# Patient Record
Sex: Female | Born: 1970
Health system: Southern US, Community
[De-identification: ages and names within clinical notes are randomized; demographics above are authoritative.]

## PROBLEM LIST (undated history)

## (undated) DIAGNOSIS — I499 Cardiac arrhythmia, unspecified: Secondary | ICD-10-CM

## (undated) DIAGNOSIS — R5383 Other fatigue: Secondary | ICD-10-CM

## (undated) DIAGNOSIS — T8859XA Other complications of anesthesia, initial encounter: Secondary | ICD-10-CM

## (undated) DIAGNOSIS — I340 Nonrheumatic mitral (valve) insufficiency: Secondary | ICD-10-CM

## (undated) DIAGNOSIS — T4145XA Adverse effect of unspecified anesthetic, initial encounter: Secondary | ICD-10-CM

## (undated) DIAGNOSIS — R002 Palpitations: Secondary | ICD-10-CM

## (undated) DIAGNOSIS — R079 Chest pain, unspecified: Secondary | ICD-10-CM

## (undated) DIAGNOSIS — Z9889 Other specified postprocedural states: Secondary | ICD-10-CM

## (undated) DIAGNOSIS — R112 Nausea with vomiting, unspecified: Secondary | ICD-10-CM

## (undated) DIAGNOSIS — I341 Nonrheumatic mitral (valve) prolapse: Secondary | ICD-10-CM

## (undated) HISTORY — DX: Other fatigue: R53.83

## (undated) HISTORY — DX: Palpitations: R00.2

## (undated) HISTORY — PX: EYE SURGERY: SHX253

## (undated) HISTORY — PX: TONSILLECTOMY: SUR1361

## (undated) HISTORY — DX: Chest pain, unspecified: R07.9

## (undated) HISTORY — DX: Nonrheumatic mitral (valve) prolapse: I34.1

## (undated) HISTORY — DX: Nonrheumatic mitral (valve) insufficiency: I34.0

## (undated) HISTORY — PX: TONSILLECTOMY: SHX5217

---

## 1998-08-04 ENCOUNTER — Inpatient Hospital Stay (HOSPITAL_COMMUNITY): Admission: AD | Admit: 1998-08-04 | Discharge: 1998-08-06 | Payer: Self-pay | Admitting: Obstetrics and Gynecology

## 2000-10-17 ENCOUNTER — Other Ambulatory Visit: Admission: RE | Admit: 2000-10-17 | Discharge: 2000-10-17 | Payer: Self-pay | Admitting: Obstetrics and Gynecology

## 2001-06-01 ENCOUNTER — Encounter (INDEPENDENT_AMBULATORY_CARE_PROVIDER_SITE_OTHER): Payer: Self-pay | Admitting: Specialist

## 2001-06-01 ENCOUNTER — Other Ambulatory Visit: Admission: RE | Admit: 2001-06-01 | Discharge: 2001-06-01 | Payer: Self-pay | Admitting: Plastic Surgery

## 2001-11-21 ENCOUNTER — Other Ambulatory Visit: Admission: RE | Admit: 2001-11-21 | Discharge: 2001-11-21 | Payer: Self-pay | Admitting: Obstetrics and Gynecology

## 2003-01-01 ENCOUNTER — Other Ambulatory Visit: Admission: RE | Admit: 2003-01-01 | Discharge: 2003-01-01 | Payer: Self-pay | Admitting: Obstetrics and Gynecology

## 2004-01-27 ENCOUNTER — Other Ambulatory Visit: Admission: RE | Admit: 2004-01-27 | Discharge: 2004-01-27 | Payer: Self-pay | Admitting: Obstetrics and Gynecology

## 2005-02-22 ENCOUNTER — Other Ambulatory Visit: Admission: RE | Admit: 2005-02-22 | Discharge: 2005-02-22 | Payer: Self-pay | Admitting: Obstetrics and Gynecology

## 2006-03-24 ENCOUNTER — Other Ambulatory Visit: Admission: RE | Admit: 2006-03-24 | Discharge: 2006-03-24 | Payer: Self-pay | Admitting: Obstetrics and Gynecology

## 2007-09-07 ENCOUNTER — Ambulatory Visit: Payer: Self-pay | Admitting: Internal Medicine

## 2007-09-07 DIAGNOSIS — J029 Acute pharyngitis, unspecified: Secondary | ICD-10-CM | POA: Insufficient documentation

## 2007-09-07 DIAGNOSIS — Z85828 Personal history of other malignant neoplasm of skin: Secondary | ICD-10-CM | POA: Insufficient documentation

## 2007-09-12 ENCOUNTER — Ambulatory Visit: Payer: Self-pay | Admitting: Internal Medicine

## 2007-09-12 DIAGNOSIS — J209 Acute bronchitis, unspecified: Secondary | ICD-10-CM | POA: Insufficient documentation

## 2008-09-12 ENCOUNTER — Ambulatory Visit: Payer: Self-pay | Admitting: Internal Medicine

## 2008-09-12 LAB — CONVERTED CEMR LAB
ALT: 19 units/L (ref 0–35)
Basophils Absolute: 0 10*3/uL (ref 0.0–0.1)
Bilirubin Urine: NEGATIVE
Bilirubin, Direct: 0.2 mg/dL (ref 0.0–0.3)
CO2: 28 meq/L (ref 19–32)
Calcium: 9.3 mg/dL (ref 8.4–10.5)
Cholesterol: 175 mg/dL (ref 0–200)
GFR calc Af Amer: 120 mL/min
HDL: 57.8 mg/dL (ref >39.0)
Hemoglobin: 11.5 g/dL — ABNORMAL LOW (ref 12.0–15.0)
Ketones, urine, test strip: NEGATIVE
LDL Cholesterol: 109 mg/dL — ABNORMAL HIGH (ref 0–99)
Lymphocytes Relative: 22.4 % (ref 12.0–46.0)
MCHC: 34.5 g/dL (ref 30.0–36.0)
Neutro Abs: 3.5 10*3/uL (ref 1.4–7.7)
Nitrite: NEGATIVE
Platelets: 167 10*3/uL (ref 150–400)
Potassium: 3.8 meq/L (ref 3.5–5.1)
Protein, U semiquant: NEGATIVE
RDW: 13.5 % (ref 11.5–14.6)
Sodium: 142 meq/L (ref 135–145)
Total Bilirubin: 1 mg/dL (ref 0.3–1.2)
Triglycerides: 40 mg/dL (ref 0–149)
Urobilinogen, UA: 0.2
VLDL: 8 mg/dL (ref 0–40)

## 2008-09-18 ENCOUNTER — Ambulatory Visit: Payer: Self-pay | Admitting: Internal Medicine

## 2008-09-18 DIAGNOSIS — I341 Nonrheumatic mitral (valve) prolapse: Secondary | ICD-10-CM | POA: Insufficient documentation

## 2008-09-18 DIAGNOSIS — D509 Iron deficiency anemia, unspecified: Secondary | ICD-10-CM | POA: Insufficient documentation

## 2009-02-16 ENCOUNTER — Ambulatory Visit: Payer: Self-pay | Admitting: Internal Medicine

## 2009-02-16 DIAGNOSIS — R55 Syncope and collapse: Secondary | ICD-10-CM | POA: Insufficient documentation

## 2009-02-16 LAB — CONVERTED CEMR LAB
Basophils Relative: 0.1 % (ref 0.0–3.0)
Eosinophils Absolute: 0.1 10*3/uL (ref 0.0–0.7)
Eosinophils Relative: 1.8 % (ref 0.0–5.0)
HCT: 39 % (ref 36.0–46.0)
MCV: 92.4 fL (ref 78.0–100.0)
Monocytes Absolute: 0.3 10*3/uL (ref 0.1–1.0)
Neutro Abs: 3.9 10*3/uL (ref 1.4–7.7)
RBC: 4.22 M/uL (ref 3.87–5.11)
WBC: 6.2 10*3/uL (ref 4.5–10.5)

## 2009-04-03 ENCOUNTER — Ambulatory Visit: Payer: Self-pay | Admitting: Family Medicine

## 2009-04-03 DIAGNOSIS — R609 Edema, unspecified: Secondary | ICD-10-CM | POA: Insufficient documentation

## 2010-08-10 ENCOUNTER — Ambulatory Visit: Payer: Self-pay | Admitting: Cardiovascular Disease

## 2010-08-10 ENCOUNTER — Ambulatory Visit (HOSPITAL_COMMUNITY): Admission: RE | Admit: 2010-08-10 | Discharge: 2010-08-10 | Payer: Self-pay | Admitting: Cardiovascular Disease

## 2012-11-13 ENCOUNTER — Encounter: Payer: Self-pay | Admitting: Cardiovascular Disease

## 2014-03-26 ENCOUNTER — Other Ambulatory Visit: Payer: Self-pay | Admitting: Sports Medicine

## 2014-03-26 DIAGNOSIS — M25569 Pain in unspecified knee: Secondary | ICD-10-CM

## 2014-03-27 ENCOUNTER — Ambulatory Visit
Admission: RE | Admit: 2014-03-27 | Discharge: 2014-03-27 | Disposition: A | Discharge status: Home / Self Care | Payer: 59 | Source: Ambulatory Visit | Attending: Sports Medicine | Admitting: Sports Medicine

## 2014-03-27 DIAGNOSIS — M25569 Pain in unspecified knee: Secondary | ICD-10-CM

## 2014-09-19 ENCOUNTER — Other Ambulatory Visit: Payer: Self-pay | Admitting: Gastroenterology

## 2014-09-19 DIAGNOSIS — R1084 Generalized abdominal pain: Secondary | ICD-10-CM

## 2014-09-25 ENCOUNTER — Ambulatory Visit
Admission: RE | Admit: 2014-09-25 | Discharge: 2014-09-25 | Disposition: A | Discharge status: Home / Self Care | Payer: 59 | Source: Ambulatory Visit | Attending: Gastroenterology | Admitting: Gastroenterology

## 2014-09-25 DIAGNOSIS — R1084 Generalized abdominal pain: Secondary | ICD-10-CM

## 2014-09-25 MED ORDER — IOHEXOL 300 MG/ML  SOLN
100.0000 mL | Freq: Once | INTRAMUSCULAR | Status: AC | PRN
  Administered 2014-09-25: 100 mL via INTRAVENOUS

## 2014-10-02 ENCOUNTER — Other Ambulatory Visit (HOSPITAL_COMMUNITY): Payer: Self-pay | Admitting: Gastroenterology

## 2014-10-02 DIAGNOSIS — R1011 Right upper quadrant pain: Secondary | ICD-10-CM

## 2014-10-15 ENCOUNTER — Ambulatory Visit (HOSPITAL_COMMUNITY)
Admission: RE | Admit: 2014-10-15 | Discharge: 2014-10-15 | Disposition: A | Discharge status: Home / Self Care | Payer: 59 | Source: Ambulatory Visit | Attending: Gastroenterology | Admitting: Gastroenterology

## 2014-10-15 ENCOUNTER — Other Ambulatory Visit (HOSPITAL_COMMUNITY): Payer: Self-pay | Admitting: Gastroenterology

## 2014-10-15 VITALS — Wt 147.0 lb

## 2014-10-15 DIAGNOSIS — R1011 Right upper quadrant pain: Secondary | ICD-10-CM | POA: Insufficient documentation

## 2014-10-15 MED ORDER — TECHNETIUM TC 99M MEBROFENIN IV KIT
5.1000 | PACK | Freq: Once | INTRAVENOUS | Status: AC | PRN
  Administered 2014-10-15: 5 via INTRAVENOUS

## 2014-10-15 MED ORDER — SINCALIDE 5 MCG IJ SOLR
0.0200 ug/kg | Freq: Once | INTRAMUSCULAR | Status: AC
  Administered 2014-10-15: 1.3 ug via INTRAVENOUS

## 2014-12-23 ENCOUNTER — Other Ambulatory Visit (HOSPITAL_COMMUNITY): Payer: Self-pay | Admitting: Gastroenterology

## 2014-12-23 DIAGNOSIS — R110 Nausea: Secondary | ICD-10-CM

## 2015-01-02 ENCOUNTER — Ambulatory Visit (HOSPITAL_COMMUNITY)
Admission: RE | Admit: 2015-01-02 | Discharge: 2015-01-02 | Disposition: A | Discharge status: Home / Self Care | Payer: 59 | Source: Ambulatory Visit | Attending: Gastroenterology | Admitting: Gastroenterology

## 2015-01-02 DIAGNOSIS — R110 Nausea: Secondary | ICD-10-CM | POA: Insufficient documentation

## 2015-01-02 DIAGNOSIS — R109 Unspecified abdominal pain: Secondary | ICD-10-CM | POA: Diagnosis present

## 2015-01-02 MED ORDER — TECHNETIUM TC 99M SULFUR COLLOID: 2.1000 | Freq: Once | INTRAVENOUS | Status: AC | PRN

## 2015-05-01 ENCOUNTER — Ambulatory Visit (INDEPENDENT_AMBULATORY_CARE_PROVIDER_SITE_OTHER): Payer: 59 | Admitting: Cardiovascular Disease

## 2015-05-01 ENCOUNTER — Encounter: Payer: Self-pay | Admitting: Cardiovascular Disease

## 2015-05-01 VITALS — BP 122/78 | HR 60 | Ht 67.25 in | Wt 141.1 lb

## 2015-05-01 DIAGNOSIS — I341 Nonrheumatic mitral (valve) prolapse: Secondary | ICD-10-CM | POA: Diagnosis not present

## 2015-05-01 NOTE — Progress Notes (Signed)
Cardiology Office Note   Date:  05/01/2015   ID:  Kara Owens, DOB 03/20/1971, MRN [id number]  PCP:  Vidal Schwalbe, MD  Cardiologist:   Thayer Headings, MD   Chief Complaint  Patient presents with  . Palpitations   Problem list: 1. Mitral prolapse/mild mitral regurgitation Palpitations   History of Present Illness: Kara Owens is a 44 y.o. female who presents for  Her MVP Has had some stomach issues.   Has chest tightness. Has intrascapular pain . Works in [employer] for [name]    Past Medical History  Diagnosis Date  . Mitral valve prolapse   . Mitral regurgitation     Mild  . Chest pain     Left sided--sharp pain  . Palpitations   . Fatigue     Extreme    Past Surgical History  Procedure Laterality Date  . Tonsillectomy       Current Outpatient Prescriptions  Medication Sig Dispense Refill  . doxycycline (VIBRA-TABS) 100 MG tablet Take 1 tablet by mouth daily.    . polyethylene glycol (MIRALAX / GLYCOLAX) packet Take 17 g by mouth as directed.     No current facility-administered medications for this visit.    Allergies:   Review of patient's allergies indicates no known allergies.    Social History:  The patient  reports that she has never smoked. She does not have any smokeless tobacco history on file. She reports that she drinks alcohol.   Family History:  The patient's family history includes Hypertension in her father and mother; Skin cancer in her father.    ROS:  Please see the history of present illness.    Review of Systems: Constitutional:  denies fever, chills, diaphoresis, appetite change and fatigue.  HEENT: denies photophobia, eye pain, redness, hearing loss, ear pain, congestion, sore throat, rhinorrhea, sneezing, neck pain, neck stiffness and tinnitus.  Respiratory: denies SOB, DOE, cough, chest tightness, and wheezing.  Cardiovascular: denies chest pain, palpitations and leg swelling.  Gastrointestinal: denies nausea,  vomiting, abdominal pain, diarrhea, constipation, blood in stool.  Genitourinary: denies dysuria, urgency, frequency, hematuria, flank pain and difficulty urinating.  Musculoskeletal: denies  myalgias, back pain, joint swelling, arthralgias and gait problem.   Skin: denies pallor, rash and wound.  Neurological: denies dizziness, seizures, syncope, weakness, light-headedness, numbness and headaches.   Hematological: denies adenopathy, easy bruising, personal or family bleeding history.  Psychiatric/ Behavioral: denies suicidal ideation, mood changes, confusion, nervousness, sleep disturbance and agitation.       All other systems are reviewed and negative.    PHYSICAL EXAM: VS:  BP 122/78 mmHg  Pulse 60  Ht 5' 7.25" (1.708 m)  Wt 141 lb 1.9 oz (64.012 kg)  BMI 21.94 kg/m2 , BMI Body mass index is 21.94 kg/(m^2). GEN: Well nourished, well developed, in no acute distress HEENT: normal Neck: no JVD, carotid bruits, or masses Cardiac: RRR; no murmurs, rubs, or gallops,no edema  Respiratory:  clear to auscultation bilaterally, normal work of breathing GI: soft, nontender, nondistended, + BS MS: no deformity or atrophy Skin: warm and dry, no rash Neuro:  Strength and sensation are intact Psych: normal   EKG:  EKG is ordered today. The ekg ordered today demonstrates NSR at 60 , normal ECG.    Recent Labs: No results found for requested labs within last 365 days.    Lipid Panel    Component Value Date/Time   CHOL 175 09/12/2008 0913   TRIG 40 09/12/2008 0913  HDL 57.8 09/12/2008 0913   CHOLHDL 3.0 CALC 09/12/2008 0913   VLDL 8 09/12/2008 0913   LDLCALC 109* 09/12/2008 0913      Wt Readings from Last 3 Encounters:  05/01/15 141 lb 1.9 oz (64.012 kg)      Other studies Reviewed: Additional studies/ records that were reviewed today include: . Review of the above records demonstrates:    ASSESSMENT AND PLAN:  1.  Mitral valve prolapse/ mitral valve prolapse:  Her  valve sounds very stable.  She has a disctictive click with a late systolic murmur. Will see her in several years .  She typically returns every 5 years.     Current medicines are reviewed at length with the patient today.  The patient does not have concerns regarding medicines.  The following changes have been made:  no change  Labs/ tests ordered today include:  No orders of the defined types were placed in this encounter.     Disposition:   FU with me as needed.      [name], [name], MD  05/01/2015 4:02 PM    [facility] [place], [place], [place]  [address] Phone: [phone]; Fax: [phone]   [facility]  [address] [place], [place]  [address] [phone]    Fax [phone]

## 2015-05-01 NOTE — Patient Instructions (Signed)
Medication Instructions:  Your physician recommends that you continue on your current medications as directed. Please refer to the Current Medication list given to you today.   Labwork: None  Testing/Procedures: None  Follow-Up: Your physician recommends that you schedule a follow-up appointment in: as needed with Dr. [name]      

## 2017-01-30 DIAGNOSIS — M25551 Pain in right hip: Secondary | ICD-10-CM | POA: Diagnosis not present

## 2017-07-12 DIAGNOSIS — H35413 Lattice degeneration of retina, bilateral: Secondary | ICD-10-CM | POA: Diagnosis not present

## 2017-10-18 DIAGNOSIS — M549 Dorsalgia, unspecified: Secondary | ICD-10-CM | POA: Diagnosis not present

## 2017-10-23 DIAGNOSIS — I340 Nonrheumatic mitral (valve) insufficiency: Secondary | ICD-10-CM | POA: Insufficient documentation

## 2017-10-23 DIAGNOSIS — R079 Chest pain, unspecified: Secondary | ICD-10-CM | POA: Insufficient documentation

## 2017-10-23 DIAGNOSIS — R002 Palpitations: Secondary | ICD-10-CM | POA: Insufficient documentation

## 2017-10-23 DIAGNOSIS — R5383 Other fatigue: Secondary | ICD-10-CM | POA: Insufficient documentation

## 2017-10-31 DIAGNOSIS — Z13220 Encounter for screening for lipoid disorders: Secondary | ICD-10-CM | POA: Diagnosis not present

## 2017-10-31 DIAGNOSIS — Z0000 Encounter for general adult medical examination without abnormal findings: Secondary | ICD-10-CM | POA: Diagnosis not present

## 2017-11-01 ENCOUNTER — Other Ambulatory Visit: Payer: Self-pay | Admitting: Family Medicine

## 2017-11-01 ENCOUNTER — Ambulatory Visit
Admission: RE | Admit: 2017-11-01 | Discharge: 2017-11-01 | Disposition: A | Discharge status: Home / Self Care | Payer: 59 | Source: Ambulatory Visit | Attending: Family Medicine | Admitting: Family Medicine

## 2017-11-01 DIAGNOSIS — M25512 Pain in left shoulder: Secondary | ICD-10-CM | POA: Diagnosis not present

## 2017-11-01 DIAGNOSIS — M898X1 Other specified disorders of bone, shoulder: Secondary | ICD-10-CM

## 2017-11-07 ENCOUNTER — Encounter: Payer: Self-pay | Admitting: Cardiovascular Disease

## 2017-11-07 ENCOUNTER — Ambulatory Visit: Payer: 59 | Admitting: Cardiovascular Disease

## 2017-11-07 VITALS — BP 130/78 | HR 64 | Ht 67.25 in | Wt 150.0 lb

## 2017-11-07 DIAGNOSIS — I341 Nonrheumatic mitral (valve) prolapse: Secondary | ICD-10-CM | POA: Diagnosis not present

## 2017-11-07 DIAGNOSIS — I340 Nonrheumatic mitral (valve) insufficiency: Secondary | ICD-10-CM | POA: Diagnosis not present

## 2017-11-07 NOTE — Patient Instructions (Addendum)
Medication Instructions:  Your physician recommends that you continue on your current medications as directed. Please refer to the Current Medication list given to you today.   Labwork: None Ordered   Testing/Procedures: Your physician has requested that you have an echocardiogram. Echocardiography is a painless test that uses sound waves to create images of your heart. It provides your doctor with information about the size and shape of your heart and how well your heart's chambers and valves are working. This procedure takes approximately one hour. There are no restrictions for this procedure.   Follow-Up: Your physician wants you to follow-up in: 2 year with Dr. [name].  You will receive a reminder letter in the mail two months in advance. If you don't receive a letter, please call our office to schedule the follow-up appointment.   If you need a refill on your cardiac medications before your next appointment, please call your pharmacy.   Thank you for choosing [facility]! Christen Bame, RN [phone]

## 2017-11-07 NOTE — Progress Notes (Signed)
Cardiology Office Note:    Date:  11/07/2017   ID:  Kara Owens, DOB 17-Jul-1971, MRN [id number]  PCP:  [name], MD  Cardiologist:  [name], MD    Referring MD: [name], MD   Chief Complaint  Patient presents with  . Follow-up    mitral valve prolapse     History of Present Illness:    Kara Owens is a 46 y.o. female with a hx of mitral valve prolapse.  I last saw Sonia in 2016.  He has a history of mitral valve prolapse with mild mitral regurgitation.  Mother [name] ) had an ablation last year. Brother had an MI at age 68. ( non smoker, HTN, obese)  Had labs at [name] [facility] office last week.  Had some pleuretic CP last week,    Was given prednisone which helped No cough, cold, no hemoptysis,  No fever, Pain was worse at nihgt.   Trying to exercise  Works in [employer].     Labs from Primary MD   Total chol = 187 Trigs = 70 HDL = 82 LDL = 91      Past Medical History:  Diagnosis Date  . Chest pain    Left sided--sharp pain  . Fatigue    Extreme  . Mitral regurgitation    Mild  . Mitral valve prolapse   . Palpitations     Past Surgical History:  Procedure Laterality Date  . TONSILLECTOMY      Current Medications: Current Meds  Medication Sig  . doxycycline (VIBRA-TABS) 100 MG tablet Take 1 tablet by mouth daily.  . polyethylene glycol (MIRALAX / GLYCOLAX) packet Take 17 g by mouth as directed.     Allergies:   Patient has no known allergies.   Social History   Socioeconomic History  . Marital status: Married    Spouse name: None  . Number of children: None  . Years of education: None  . Highest education level: None  Social Needs  . Financial resource strain: None  . Food insecurity - worry: None  . Food insecurity - inability: None  . Transportation needs - medical: None  . Transportation needs - non-medical: None  Occupational History  . None  Tobacco Use  . Smoking status: Never Smoker  .  Smokeless tobacco: Never Used  Substance and Sexual Activity  . Alcohol use: Yes  . Drug use: None  . Sexual activity: None  Other Topics Concern  . None  Social History Narrative  . None     Family History: The patient's family history includes Heart attack in her brother; Heart failure in her mother; Hypertension in her father and mother; Skin cancer in her father. ROS:   Please see the history of present illness.     All other systems reviewed and are negative.  EKGs/Labs/Other Studies Reviewed:    The following studies were reviewed today:   EKG:  EKG is  ordered today.  The ekg ordered today demonstrates  NSR at 65.  Normal ecg   Recent Labs: No results found for requested labs within last 8760 hours.  Recent Lipid Panel    Component Value Date/Time   CHOL 175 09/12/2008 0913   TRIG 40 09/12/2008 0913   HDL 57.8 09/12/2008 0913   CHOLHDL 3.0 CALC 09/12/2008 0913   VLDL 8 09/12/2008 0913   LDLCALC 109 (H) 09/12/2008 0913    Physical Exam:    VS:  BP 130/78  Pulse 64   Ht 5' 7.25" (1.708 m)   Wt 150 lb (68 kg)   SpO2 97%   BMI 23.32 kg/m     Wt Readings from Last 3 Encounters:  11/07/17 150 lb (68 kg)  05/01/15 141 lb 1.9 oz (64 kg)  10/15/14 147 lb (66.7 kg)     GEN:  Well nourished, well developed in no acute distress HEENT: Normal NECK: No JVD; No carotid bruits LYMPHATICS: No lymphadenopathy CARDIAC:   RR, normal B8G6,   + click vs. Split S2,   1-2/6 brief, late systolic murmur heard best lower chest at midline RESPIRATORY:  Clear to auscultation without rales, wheezing or rhonchi  ABDOMEN: Soft, non-tender, non-distended MUSCULOSKELETAL:  No edema; No deformity  SKIN: Warm and dry NEUROLOGIC:  Alert and oriented x 3 PSYCHIATRIC:  Normal affect   ASSESSMENT:    1. Non-rheumatic mitral regurgitation   2. Mitral valve prolapse    PLAN:    In order of problems listed above:  1. Mitral regurgitation: Stanley has mild mitral valve prolapse.   Her murmur sounds similar to her last exam in 2016.  She does not have any signs or symptoms of congestive heart failure or any signs that would suggest that she has worsening mitral regurgitation.  It has been 7 years since her last echocardiogram.  We will repeat her echocardiogram for further assessment of her mitral valve prolapse/regurgitation and her LV function.  2.  Family history of cardiac disease her mother recently was diagnosed with congestive heart failure.  In addition, her brother had a heart attack over the past year at age 54.  I reviewed her cholesterol levels from her primary medical doctor's office.  Total chol = 187 Trigs = 70 HDL = 82 LDL = 91  Her lipid levels are quite well controlled.  She does not smoke.  At this point I think that she is at low risk of having any coronary artery disease.    continue to see her on an every two-year basis.   Medication Adjustments/Labs and Tests Ordered: Current medicines are reviewed at length with the patient today.  Concerns regarding medicines are outlined above.  Orders Placed This Encounter  Procedures  . EKG 12-Lead  . ECHOCARDIOGRAM COMPLETE   No orders of the defined types were placed in this encounter.   Signed, [name], MD  11/07/2017 9:15 AM    [place]

## 2017-11-27 DIAGNOSIS — K635 Polyp of colon: Secondary | ICD-10-CM | POA: Diagnosis not present

## 2017-11-27 DIAGNOSIS — Z86010 Personal history of colonic polyps: Secondary | ICD-10-CM | POA: Diagnosis not present

## 2017-11-27 DIAGNOSIS — D126 Benign neoplasm of colon, unspecified: Secondary | ICD-10-CM | POA: Diagnosis not present

## 2017-11-29 ENCOUNTER — Other Ambulatory Visit (HOSPITAL_COMMUNITY): Payer: 59

## 2017-11-29 ENCOUNTER — Other Ambulatory Visit: Payer: Self-pay

## 2017-11-29 ENCOUNTER — Ambulatory Visit (HOSPITAL_COMMUNITY): Payer: 59 | Attending: Cardiovascular Disease

## 2017-11-29 DIAGNOSIS — I340 Nonrheumatic mitral (valve) insufficiency: Secondary | ICD-10-CM

## 2017-11-29 DIAGNOSIS — I081 Rheumatic disorders of both mitral and tricuspid valves: Secondary | ICD-10-CM | POA: Diagnosis not present

## 2017-11-29 DIAGNOSIS — I341 Nonrheumatic mitral (valve) prolapse: Secondary | ICD-10-CM

## 2017-11-30 ENCOUNTER — Telehealth: Payer: Self-pay | Admitting: Nurse Practitioner

## 2017-11-30 DIAGNOSIS — I340 Nonrheumatic mitral (valve) insufficiency: Secondary | ICD-10-CM

## 2017-11-30 NOTE — Telephone Encounter (Signed)
-----   Message from Thayer Headings, MD sent at 11/29/2017  6:20 PM EST ----- Her MVP has worsened. MR is moderate based on PISA of 0.6 cm but appears severe in several other views To me her LV function appears normal ( perhaps low normal, not 40-45% noted in report )    I have called and discussed with Katanya.  Will need a TEE next week ( Thursday or Friday if possible)  She will need an appt to see Dr. Roxy Manns to consider MV repair

## 2017-11-30 NOTE — Telephone Encounter (Signed)
  You are scheduled for a TEE on Friday Dec. 14 with Dr. [name].  Please arrive at the [facility] (Main Entrance A) at [facility]: [address] [place], [place] [address] at 1:00 pm. (1 hour prior to procedure unless lab work is needed)  DIET: Nothing to eat or drink after midnight except a sip of water with medications (see medication instructions below)  Medication Instructions: Continue current medications  Labs: (BMET, CBC) Come to the lab at [facility] between the hours of 8:00 am and 4:30 pm on Wed. 12/12. You do not have to be fasting.  You must have a responsible person to drive you home and stay in the waiting area during your procedure. Failure to do so could result in cancellation. Bring your insurance cards. *Special Note: Every effort is made to have your procedure done on time. Occasionally there are emergencies that occur at the hospital that may cause delays. Please be patient if a delay does occur.

## 2017-12-06 ENCOUNTER — Other Ambulatory Visit: Payer: 59 | Admitting: *Deleted

## 2017-12-06 ENCOUNTER — Telehealth: Payer: Self-pay | Admitting: Cardiovascular Disease

## 2017-12-06 DIAGNOSIS — I340 Nonrheumatic mitral (valve) insufficiency: Secondary | ICD-10-CM | POA: Diagnosis not present

## 2017-12-06 NOTE — Telephone Encounter (Signed)
New Message  Pt call requesting to speak with RN about getting instruction for procedure on 12/14. Please call back to discuss

## 2017-12-06 NOTE — Telephone Encounter (Signed)
Spoke with patient about her upcoming TEE on Friday 12/14. I answered questions to her satisfaction about the need to be NPO after midnight. She is concerned about going without water for that many hours. I advised she could have some water prior to 0600 but that if she gets a call to come in earlier she will need to make certain the person that calls her has that information. She has an appointment for lab work today. She verbalized understanding and agreement with the plan and thanked me for the call.

## 2017-12-07 LAB — CBC
HEMATOCRIT: 37.2 % (ref 34.0–46.6)
Hemoglobin: 12.7 g/dL (ref 11.1–15.9)
MCH: 30.1 pg (ref 26.6–33.0)
MCHC: 34.1 g/dL (ref 31.5–35.7)
MCV: 88 fL (ref 79–97)
Platelets: 224 10*3/uL (ref 150–379)
RBC: 4.22 x10E6/uL (ref 3.77–5.28)
RDW: 14.7 % (ref 12.3–15.4)
WBC: 5.2 10*3/uL (ref 3.4–10.8)

## 2017-12-07 LAB — BASIC METABOLIC PANEL
BUN/Creatinine Ratio: 20 (ref 9–23)
BUN: 12 mg/dL (ref 6–24)
CO2: 25 mmol/L (ref 20–29)
CREATININE: 0.59 mg/dL (ref 0.57–1.00)
Calcium: 9.3 mg/dL (ref 8.7–10.2)
Chloride: 103 mmol/L (ref 96–106)
GFR calc non Af Amer: 109 mL/min/{1.73_m2} (ref >59)
GFR, EST AFRICAN AMERICAN: 125 mL/min/{1.73_m2} (ref >59)
Glucose: 85 mg/dL (ref 65–99)
Potassium: 4 mmol/L (ref 3.5–5.2)
Sodium: 142 mmol/L (ref 134–144)

## 2017-12-08 ENCOUNTER — Other Ambulatory Visit: Payer: Self-pay

## 2017-12-08 ENCOUNTER — Ambulatory Visit (HOSPITAL_BASED_OUTPATIENT_CLINIC_OR_DEPARTMENT_OTHER)
Admission: RE | Admit: 2017-12-08 | Discharge: 2017-12-08 | Disposition: A | Discharge status: Home / Self Care | Payer: 59 | Source: Ambulatory Visit | Attending: Cardiovascular Disease | Admitting: Cardiovascular Disease

## 2017-12-08 ENCOUNTER — Encounter (HOSPITAL_COMMUNITY)
Admission: RE | Disposition: A | Discharge status: Home / Self Care | Payer: Self-pay | Source: Ambulatory Visit | Attending: Cardiovascular Disease

## 2017-12-08 ENCOUNTER — Encounter (HOSPITAL_COMMUNITY): Payer: Self-pay | Admitting: *Deleted

## 2017-12-08 ENCOUNTER — Ambulatory Visit (HOSPITAL_COMMUNITY)
Admission: RE | Admit: 2017-12-08 | Discharge: 2017-12-08 | Disposition: A | Discharge status: Home / Self Care | Payer: 59 | Source: Ambulatory Visit | Attending: Cardiovascular Disease | Admitting: Cardiovascular Disease

## 2017-12-08 DIAGNOSIS — I341 Nonrheumatic mitral (valve) prolapse: Secondary | ICD-10-CM | POA: Diagnosis present

## 2017-12-08 DIAGNOSIS — I340 Nonrheumatic mitral (valve) insufficiency: Secondary | ICD-10-CM | POA: Diagnosis not present

## 2017-12-08 DIAGNOSIS — Z8249 Family history of ischemic heart disease and other diseases of the circulatory system: Secondary | ICD-10-CM | POA: Diagnosis not present

## 2017-12-08 HISTORY — PX: TEE WITHOUT CARDIOVERSION: SHX5443

## 2017-12-08 SURGERY — ECHOCARDIOGRAM, TRANSESOPHAGEAL
Anesthesia: Moderate Sedation

## 2017-12-08 MED ORDER — DIPHENHYDRAMINE HCL 50 MG/ML IJ SOLN
INTRAMUSCULAR | Status: AC
  Filled 2017-12-08: qty 1

## 2017-12-08 MED ORDER — FENTANYL CITRATE (PF) 100 MCG/2ML IJ SOLN
INTRAMUSCULAR | Status: AC
  Filled 2017-12-08: qty 2

## 2017-12-08 MED ORDER — SODIUM CHLORIDE 0.9 % IV SOLN: INTRAVENOUS | Status: DC

## 2017-12-08 MED ORDER — MIDAZOLAM HCL 10 MG/2ML IJ SOLN
INTRAMUSCULAR | Status: DC | PRN
  Administered 2017-12-08: 2 mg via INTRAVENOUS
  Administered 2017-12-08: 1 mg via INTRAVENOUS
  Administered 2017-12-08: 2 mg via INTRAVENOUS

## 2017-12-08 MED ORDER — FENTANYL CITRATE (PF) 100 MCG/2ML IJ SOLN
INTRAMUSCULAR | Status: DC | PRN
  Administered 2017-12-08 (×2): 25 ug via INTRAVENOUS

## 2017-12-08 MED ORDER — BUTAMBEN-TETRACAINE-BENZOCAINE 2-2-14 % EX AERO
INHALATION_SPRAY | CUTANEOUS | Status: DC | PRN
  Administered 2017-12-08: 1 via TOPICAL

## 2017-12-08 MED ORDER — MIDAZOLAM HCL 5 MG/ML IJ SOLN
INTRAMUSCULAR | Status: AC
  Filled 2017-12-08: qty 2

## 2017-12-08 NOTE — Discharge Instructions (Signed)
TEE  YOU HAD AN CARDIAC PROCEDURE TODAY: Refer to the procedure report and other information in the discharge instructions given to you for any specific questions about what was found during the examination. If this information does not answer your questions, please call [facility] office at [phone] to clarify.   DIET: Your first meal following the procedure should be a light meal and then it is ok to progress to your normal diet. A half-sandwich or bowl of soup is an example of a good first meal. Heavy or fried foods are harder to digest and may make you feel nauseous or bloated. Drink plenty of fluids but you should avoid alcoholic beverages for 24 hours. If you had a esophageal dilation, please see attached instructions for diet.   ACTIVITY: Your care partner should take you home directly after the procedure. You should plan to take it easy, moving slowly for the rest of the day. You can resume normal activity the day after the procedure however YOU SHOULD NOT DRIVE, use power tools, machinery or perform tasks that involve climbing or major physical exertion for 24 hours (because of the sedation medicines used during the test).   SYMPTOMS TO REPORT IMMEDIATELY: A cardiologist can be reached at any hour. Please call [phone] for any of the following symptoms:  Vomiting of blood or coffee ground material  New, significant abdominal pain  New, significant chest pain or pain under the shoulder blades  Painful or persistently difficult swallowing  New shortness of breath  Black, tarry-looking or red, bloody stools  FOLLOW UP:  Please also call with any specific questions about appointments or follow up tests.  TEE  YOU HAD AN CARDIAC PROCEDURE TODAY: Refer to the procedure report and other information in the discharge instructions given to you for any specific questions about what was found during the examination. If this information does not answer your questions, please call [facility] office at [phone] to clarify.   DIET: Your first meal following the procedure should be a light meal and then it is ok to progress to your normal diet. A half-sandwich or bowl of soup is an example of a good first meal. Heavy or fried foods are harder to digest and may make you feel nauseous or bloated. Drink plenty of fluids but you should avoid alcoholic beverages for 24 hours. If you had a esophageal dilation, please see attached instructions for diet.   ACTIVITY: Your care partner should take you home directly after the procedure. You should plan to take it easy, moving slowly for the rest of the day. You can resume normal activity the day after the procedure however YOU SHOULD NOT DRIVE, use power tools, machinery or perform tasks that involve climbing or major physical exertion for 24 hours (because of the sedation medicines used during the test).   SYMPTOMS TO REPORT IMMEDIATELY: A cardiologist can be reached at any hour. Please call [phone] for any of the following symptoms:  Vomiting of blood or coffee ground material  New, significant abdominal pain  New, significant chest pain or pain under the shoulder blades  Painful or persistently difficult swallowing  New shortness of breath  Black, tarry-looking or red, bloody stools  FOLLOW UP:  Please also call with any specific questions about appointments or follow up tests.     Moderate Conscious Sedation, Adult, Care After These instructions provide you with information about caring for yourself after your procedure. Your health care provider may also give you more  specific instructions. Your treatment has been planned according to current medical practices, but problems sometimes occur. Call your health care provider if you have any problems or questions after your procedure. What can I expect after the procedure? After your procedure, it is common:  To feel sleepy for several hours.  To feel clumsy and have  poor balance for several hours.  To have poor judgment for several hours.  To vomit if you eat too soon.  Follow these instructions at home: For at least 24 hours after the procedure:   Do not: ? Participate in activities where you could fall or become injured. ? Drive. ? Use heavy machinery. ? Drink alcohol. ? Take sleeping pills or medicines that cause drowsiness. ? Make important decisions or sign legal documents. ? Take care of children on your own.  Rest. Eating and drinking  Follow the diet recommended by your health care provider.  If you vomit: ? Drink water, juice, or soup when you can drink without vomiting. ? Make sure you have little or no nausea before eating solid foods. General instructions  Have a responsible adult stay with you until you are awake and alert.  Take over-the-counter and prescription medicines only as told by your health care provider.  If you smoke, do not smoke without supervision.  Keep all follow-up visits as told by your health care provider. This is important. Contact a health care provider if:  You keep feeling nauseous or you keep vomiting.  You feel light-headed.  You develop a rash.  You have a fever. Get help right away if:  You have trouble breathing. This information is not intended to replace advice given to you by your health care provider. Make sure you discuss any questions you have with your health care provider. Document Released: 10/02/2013 Document Revised: 05/16/2016 Document Reviewed: 04/02/2016 Elsevier Interactive Patient Education  [employer].

## 2017-12-08 NOTE — CV Procedure (Signed)
    Transesophageal Echocardiogram Note  Artice Holohan [id number] 26-Dec-1971  Procedure: Transesophageal Echocardiogram Indications: MVP with MR   Procedure Details Consent: Obtained Time Out: Verified patient identification, verified procedure, site/side was marked, verified correct patient position, special equipment/implants available, Radiology Safety Procedures followed,  medications/allergies/relevent history reviewed, required imaging and test results available.  Performed  Medications:  During this procedure the patient is administered a total of Versed 5  mg and Fentanyl 50  mcg  to achieve and maintain moderate conscious sedation.  The patient's heart rate, blood pressure, and oxygen saturation are monitored continuously during the procedure. The period of conscious sedation is 30  minutes, of which I was present face-to-face 100% of this time.  Left Ventrical:  Normal LV function   Mitral Valve: prolapse of the anterior and posterior leafltets . Mod MR.  There is no reversal of flow in the PV  Aortic Valve: normal   Tricuspid Valve: trivial TR   Pulmonic Valve:  Trivial PI   Left Atrium/ Left atrial appendage: no thrombi   Atrial septum: bows generally left to right.  No evidence of PFO or ASD by color doppler   Aorta: normal .    Complications: No apparent complications Patient did tolerate procedure well.   Thayer Headings, Brooke Bonito., MD, [facility] 12/08/2017, 2:37 PM

## 2017-12-08 NOTE — Progress Notes (Signed)
  Echocardiogram Echocardiogram Transesophageal has been performed.  Glendon Dunwoody G Dontai Pember 12/08/2017, 3:36 PM

## 2017-12-08 NOTE — H&P (Signed)
Cardiology Office Note:    Date:  11/07/2017   ID:  Kara Owens, DOB 05-Jun-1971, MRN [id number]  PCP:  [name], MD       Cardiologist:  [name], MD    Referring MD: [name], MD       Chief Complaint  Patient presents with  . Follow-up    mitral valve prolapse     History of Present Illness:    Kara Owens is a 46 y.o. female with a hx of mitral valve prolapse.  I last saw Kara Owens in 2016.  He has a history of mitral valve prolapse with mild mitral regurgitation.  Mother [name] ) had an ablation last year. Brother had an MI at age 21. ( non smoker, HTN, obese)  Had labs at [name] [facility] office last week.  Had some pleuretic CP last week,    Was given prednisone which helped No cough, cold, no hemoptysis,  No fever, Pain was worse at nihgt.   Trying to exercise  Works in [employer].     Labs from Primary MD   Total chol = 187 Trigs = 70 HDL = 82 LDL = 91          Past Medical History:  Diagnosis Date  . Chest pain    Left sided--sharp pain  . Fatigue    Extreme  . Mitral regurgitation    Mild  . Mitral valve prolapse   . Palpitations          Past Surgical History:  Procedure Laterality Date  . TONSILLECTOMY      Current Medications: Active Medications      Current Meds  Medication Sig  . doxycycline (VIBRA-TABS) 100 MG tablet Take 1 tablet by mouth daily.  . polyethylene glycol (MIRALAX / GLYCOLAX) packet Take 17 g by mouth as directed.       Allergies:   Patient has no known allergies.   Social History        Socioeconomic History  . Marital status: Married    Spouse name: None  . Number of children: None  . Years of education: None  . Highest education level: None  Social Needs  . Financial resource strain: None  . Food insecurity - worry: None  . Food insecurity - inability: None  . Transportation needs - medical: None  . Transportation needs -  non-medical: None  Occupational History  . None  Tobacco Use  . Smoking status: Never Smoker  . Smokeless tobacco: Never Used  Substance and Sexual Activity  . Alcohol use: Yes  . Drug use: None  . Sexual activity: None  Other Topics Concern  . None  Social History Narrative  . None     Family History: The patient's family history includes Heart attack in her brother; Heart failure in her mother; Hypertension in her father and mother; Skin cancer in her father. ROS:   Please see the history of present illness.     All other systems reviewed and are negative.  EKGs/Labs/Other Studies Reviewed:    The following studies were reviewed today:   EKG:  EKG is  ordered today.  The ekg ordered today demonstrates  NSR at 65.  Normal ecg   Recent Labs: No results found for requested labs within last 8760 hours.  Recent Lipid Panel Labs (Brief)          Component Value Date/Time   CHOL 175 09/12/2008 0913   TRIG 40 09/12/2008  0913   HDL 57.8 09/12/2008 0913   CHOLHDL 3.0 CALC 09/12/2008 0913   VLDL 8 09/12/2008 0913   LDLCALC 109 (H) 09/12/2008 0913      Physical Exam:    VS:  BP 130/78   Pulse 64   Ht 5' 7.25" (1.708 m)   Wt 150 lb (68 kg)   SpO2 97%   BMI 23.32 kg/m        Wt Readings from Last 3 Encounters:  11/07/17 150 lb (68 kg)  05/01/15 141 lb 1.9 oz (64 kg)  10/15/14 147 lb (66.7 kg)     GEN:  Well nourished, well developed in no acute distress HEENT: Normal NECK: No JVD; No carotid bruits LYMPHATICS: No lymphadenopathy CARDIAC:   RR, normal O4H9,   + click vs. Split S2,   1-2/6 brief, late systolic murmur heard best lower chest at midline RESPIRATORY:  Clear to auscultation without rales, wheezing or rhonchi  ABDOMEN: Soft, non-tender, non-distended MUSCULOSKELETAL:  No edema; No deformity  SKIN: Warm and dry NEUROLOGIC:  Alert and oriented x 3 PSYCHIATRIC:  Normal affect   ASSESSMENT:    1. Non-rheumatic mitral  regurgitation   2. Mitral valve prolapse    PLAN:    In order of problems listed above:  Mitral regurgitation: MR murmur has worsened some  Will get repeat echo  Asymptomatic   2.  Family history of cardiac disease her mother recently was diagnosed with congestive heart failure.  In addition, her brother had a heart attack over the past year at age 79.  I reviewed her cholesterol levels from her primary medical doctor's office.  Total chol = 187 Trigs = 70 HDL = 82 LDL = 91  Her lipid levels are quite well controlled.  She does not smoke.  At this point I think that she is at low risk of having any coronary artery disease.    [name], MD  12/08/2017 2:13 PM    [facility] [place],  [address] [place], [place]  [address] Pager [phone] Phone: [phone]; Fax: [phone]

## 2017-12-10 ENCOUNTER — Encounter (HOSPITAL_COMMUNITY): Payer: Self-pay | Admitting: Cardiovascular Disease

## 2018-01-23 DIAGNOSIS — Z01419 Encounter for gynecological examination (general) (routine) without abnormal findings: Secondary | ICD-10-CM | POA: Diagnosis not present

## 2018-01-23 DIAGNOSIS — Z6823 Body mass index (BMI) 23.0-23.9, adult: Secondary | ICD-10-CM | POA: Diagnosis not present

## 2018-02-16 DIAGNOSIS — N939 Abnormal uterine and vaginal bleeding, unspecified: Secondary | ICD-10-CM | POA: Diagnosis not present

## 2018-02-23 ENCOUNTER — Encounter: Payer: Self-pay | Admitting: Cardiovascular Disease

## 2018-02-23 ENCOUNTER — Ambulatory Visit: Payer: 59 | Admitting: Cardiovascular Disease

## 2018-02-23 VITALS — BP 112/90 | HR 76 | Ht 67.0 in | Wt 154.4 lb

## 2018-02-23 DIAGNOSIS — I341 Nonrheumatic mitral (valve) prolapse: Secondary | ICD-10-CM | POA: Diagnosis not present

## 2018-02-23 DIAGNOSIS — I340 Nonrheumatic mitral (valve) insufficiency: Secondary | ICD-10-CM

## 2018-02-23 NOTE — Progress Notes (Signed)
Cardiology Office Note:    Date:  02/23/2018   ID:  Kara Owens, DOB August 29, 1971, MRN [id number]  PCP:  [name], MD  Cardiologist:  [name], MD    Referring MD: [name], MD   Chief Complaint  Patient presents with  . Mitral Valve Prolapse    History of Present Illness:    Kara Owens is a 47 y.o. female with a hx of mitral valve prolapse.  I last saw Kara Owens.  He has a history of mitral valve prolapse with mild mitral regurgitation.  Mother Kara Owens ) had an ablation last year. Brother had an MI at age 34. ( non smoker, HTN, obese)  Had labs at [name] [facility] office last week.  Had some pleuretic CP last week,    Was given prednisone which helped No cough, cold, no hemoptysis,  No fever, Pain was worse at nihgt.   Trying to exercise  Works in [employer].     Labs from Primary MD   Total chol = 187 Trigs = 70 HDL = 82 LDL = 91   February 23, 2018 Seen with husband  BP was elevated at the TEE  Brought her BP log , all readings were great  Is not exercising regularly   Past Medical History:  Diagnosis Date  . Chest pain    Left sided--sharp pain  . Fatigue    Extreme  . Mitral regurgitation    Mild  . Mitral valve prolapse   . Palpitations     Past Surgical History:  Procedure Laterality Date  . TEE WITHOUT CARDIOVERSION N/A 12/08/2017   Procedure: TRANSESOPHAGEAL ECHOCARDIOGRAM (TEE);  Surgeon: [name], MD;  Location: [facility] ENDOSCOPY;  Service: Cardiovascular;  Laterality: N/A;  . TONSILLECTOMY      Current Medications: Current Meds  Medication Sig  . Ascorbic Acid (VITAMIN C PO) Take 1 tablet by mouth daily at 3 pm.  . Calcium Carbonate-Vitamin D (CALCIUM-D PO) Take 1 tablet by mouth daily at 3 pm.  . doxycycline (VIBRA-TABS) 100 MG tablet Take 100 mg by mouth daily.   . Multiple Vitamin (MULTIVITAMIN WITH MINERALS) TABS tablet Take 1 tablet by mouth daily at 3 pm.  . Pediatric Multivitamins-Iron  (FLINTSTONES PLUS IRON) chewable tablet Chew 1 tablet by mouth daily at 3 pm.  . polyethylene glycol (MIRALAX / GLYCOLAX) packet Take 17 g by mouth daily.      Allergies:   Patient has no known allergies.   Social History   Socioeconomic History  . Marital status: Married    Spouse name: None  . Number of children: None  . Years of education: None  . Highest education level: None  Social Needs  . Financial resource strain: None  . Food insecurity - worry: None  . Food insecurity - inability: None  . Transportation needs - medical: None  . Transportation needs - non-medical: None  Occupational History  . None  Tobacco Use  . Smoking status: Never Smoker  . Smokeless tobacco: Never Used  Substance and Sexual Activity  . Alcohol use: Yes  . Drug use: None  . Sexual activity: None  Other Topics Concern  . None  Social History Narrative  . None     Family History: The patient's family history includes Heart attack in her brother; Heart failure in her mother; Hypertension in her father and mother; Skin cancer in her father. ROS:   Please see the history of present illness.  All other systems reviewed and are negative.  EKGs/Labs/Other Studies Reviewed:    The following studies were reviewed today:   EKG:     Recent Labs: 12/06/2017: BUN 12; Creatinine, Ser 0.59; Hemoglobin 12.7; Platelets 224; Potassium 4.0; Sodium 142  Recent Lipid Panel    Component Value Date/Time   CHOL 175 09/12/2008 0913   TRIG 40 09/12/2008 0913   HDL 57.8 09/12/2008 0913   CHOLHDL 3.0 CALC 09/12/2008 0913   VLDL 8 09/12/2008 0913   LDLCALC 109 (H) 09/12/2008 0913    Physical Exam: Blood pressure 112/90, pulse 76, height 5\' 7"  (1.702 m), weight 154 lb 6.4 oz (70 kg), SpO2 99 %.  GEN:  Well nourished, well developed in no acute distress HEENT: Normal NECK: No JVD; No carotid bruits LYMPHATICS: No lymphadenopathy CARDIAC: RRR,  Hight pitched , late systolic murmur  RESPIRATORY:   Clear to auscultation without rales, wheezing or rhonchi  ABDOMEN: Soft, non-tender, non-distended MUSCULOSKELETAL:  No edema; No deformity  SKIN: Warm and dry NEUROLOGIC:  Alert and oriented x 3   ASSESSMENT:    No diagnosis found. PLAN:    In order of problems listed above:  1. Mitral regurgitation:  Has moderate MR .  Stable 2. Palpitations:  Likely PVCs.   Does not want to wear a monitor at this time Suggested V-8 juice , salt , potassium  Replacement     2.  Family history of cardiac disease :    Total chol = 187 Trigs = 70 HDL = 82 LDL = 91  Her lipid levels are quite well controlled.  She does not smoke.  At this point I think that she is at low risk of having any coronary artery disease.  Will see her in 1 year.    Medication Adjustments/Labs and Tests Ordered: Current medicines are reviewed at length with the patient today.  Concerns regarding medicines are outlined above.  No orders of the defined types were placed in this encounter.  No orders of the defined types were placed in this encounter.   Signed, [name], MD  02/23/2018 8:57 AM    [place]

## 2018-02-23 NOTE — Patient Instructions (Signed)
Medication Instructions:  Your physician recommends that you continue on your current medications as directed. Please refer to the Current Medication list given to you today.   Labwork: None Ordered   Testing/Procedures: None Ordered   Follow-Up: Your physician wants you to follow-up in: 1 year with Dr. [name].  You will receive a reminder letter in the mail two months in advance. If you don't receive a letter, please call our office to schedule the follow-up appointment.   If you need a refill on your cardiac medications before your next appointment, please call your pharmacy.   Thank you for choosing [facility]! Senaida Chilcote, RN [phone]    

## 2018-03-20 ENCOUNTER — Telehealth: Payer: Self-pay | Admitting: Cardiovascular Disease

## 2018-03-20 NOTE — Telephone Encounter (Signed)
New Message      [facility] Pre-operative Risk Assessment    Request for surgical clearance:  1. What type of surgery is being performed? Hysterectomy d&c and myosure and novasure procedure  2. When is this surgery scheduled? 03/22/18  3. What type of clearance is required (medical clearance vs. Pharmacy clearance to hold med vs. Both)? medical  4. Are there any medications that need to be held prior to surgery and how long? no  5. Practice name and name of physician performing surgery? [facility], Dr. Lynnae Sandhoff and Dr. Corinna Capra  6. What is your office phone and fax number? [phone] Fax: [phone]  7. Anesthesia type (None, local, MAC, general) ? probathol  Also needs a copy of the patient's echo from 11/29/17 attached  Kara Owens 03/20/2018, 3:28 PM  _________________________________________________________________   (provider comments below)

## 2018-03-20 NOTE — Telephone Encounter (Signed)
   Primary Cardiologist: [name], MD  Chart reviewed as part of pre-operative protocol coverage. Patient was contacted 03/20/2018 in reference to pre-operative risk assessment for pending surgery as outlined below.  Kara Owens was last seen on 02/23/18 by Dr. [name].  Since that day, Kara Owens has done well with no new symptoms. She has moderate MR which is stable.   Therefore, based on ACC/AHA guidelines, the patient would be at acceptable risk for the planned procedure without further cardiovascular testing.   I will send the recent echocardiogram results as requested.   I will route this recommendation to the requesting party via Epic fax function and remove from pre-op pool.  Please call with questions.  Daune Perch, NP 03/20/2018, 3:47 PM

## 2018-03-20 NOTE — Telephone Encounter (Signed)
Result status: Final result                              *[facility]*                         [address], [place] [address]                            [phone]  ------------------------------------------------------------------- Transesophageal Echocardiography  Patient:    Kara Owens, Kara Owens MR #:       [id number] Study Date: 12/08/2017 Gender:     F Age:        47 Height:     170.2 cm Weight:     68.2 kg BSA:        1.8 m^2 Pt. Status: Room:   ATTENDING    [name], M.D.  PERFORMING   [name], M.D.  REFERRING    [name], M.D.  ADMITTING    [name], Peggyann Juba     [name], Jr  REFERRING    [name], Jr  SONOGRAPHER  Dance, Tiffany  cc:  ------------------------------------------------------------------- LV EF: 60% -   65%  ------------------------------------------------------------------- Indications:      Mitral regurgitation 424.0.  ------------------------------------------------------------------- History:   PMH:   Angina pectoris.  Mitral valve prolapse.  ------------------------------------------------------------------- Study Conclusions  - Left ventricle: Systolic function was normal. The estimated   ejection fraction was in the range of 60% to 65%. - Aortic valve: No evidence of vegetation. - Mitral valve: Moderate prolapse, involving the anterior leaflet   and the posterior leaflet. There was moderate regurgitation. - Left atrium: No evidence of thrombus in the atrial cavity or   appendage. - Tricuspid valve: No evidence of vegetation. - Pulmonic valve: No evidence of vegetation.

## 2018-03-22 DIAGNOSIS — N8500 Endometrial hyperplasia, unspecified: Secondary | ICD-10-CM | POA: Diagnosis not present

## 2018-03-22 DIAGNOSIS — N840 Polyp of corpus uteri: Secondary | ICD-10-CM | POA: Diagnosis not present

## 2018-03-22 DIAGNOSIS — N939 Abnormal uterine and vaginal bleeding, unspecified: Secondary | ICD-10-CM | POA: Diagnosis not present

## 2018-03-22 DIAGNOSIS — Z3202 Encounter for pregnancy test, result negative: Secondary | ICD-10-CM | POA: Diagnosis not present

## 2018-03-22 DIAGNOSIS — N920 Excessive and frequent menstruation with regular cycle: Secondary | ICD-10-CM | POA: Diagnosis not present

## 2018-04-11 ENCOUNTER — Telehealth: Payer: Self-pay | Admitting: Cardiovascular Disease

## 2018-04-11 DIAGNOSIS — I493 Ventricular premature depolarization: Secondary | ICD-10-CM

## 2018-04-11 MED ORDER — PROPRANOLOL HCL 10 MG PO TABS: 10.0000 mg | ORAL_TABLET | Freq: Three times a day (TID) | ORAL | 6 refills | Status: DC | PRN

## 2018-04-11 NOTE — Telephone Encounter (Signed)
New Message  Pt states that her heart has been racing and Dr. [name] told her to call in to schedule a heart monitor if it started getting worse. Please call

## 2018-04-11 NOTE — Telephone Encounter (Signed)
Agree with note by Army Melia, RN. Will try Propranolol as needed.  Will place a monitor

## 2018-04-11 NOTE — Telephone Encounter (Signed)
Spoke with patient who states she is having increased palpitations and they are waking her up at night. She states this has occurred for several nights in row which is unusual. She states she has been very mindful of staying well hydrated and drinking V8 juice, especially when she feels like her BP is running low. She denies chest discomfort and SOB. She states Dr. [name] offered to order a monitor for her at her last ov and she declined. She would like to get that scheduled now. I advised that in addition, we can prescribe some low dose propranolol for her to take as needed. I advised her to be careful of low BP and to call if symptoms worsen prior to monitor appointment on 5/1. She verbalized understanding and agreement and thanked me for the call.

## 2018-04-25 ENCOUNTER — Ambulatory Visit (INDEPENDENT_AMBULATORY_CARE_PROVIDER_SITE_OTHER): Payer: 59

## 2018-04-25 DIAGNOSIS — I493 Ventricular premature depolarization: Secondary | ICD-10-CM | POA: Diagnosis not present

## 2018-04-27 ENCOUNTER — Telehealth: Payer: Self-pay | Admitting: Cardiovascular Disease

## 2018-04-27 DIAGNOSIS — R002 Palpitations: Secondary | ICD-10-CM

## 2018-04-27 DIAGNOSIS — I472 Ventricular tachycardia, unspecified: Secondary | ICD-10-CM

## 2018-04-27 NOTE — Telephone Encounter (Signed)
New message:      Arbie Cookey calling with critical EKG for pt

## 2018-04-27 NOTE — Telephone Encounter (Signed)
[employer] called into the office to report a Critical event monitor recording on this pt.  Per Arbie Cookey at [employer], the pt had a 28 beat run of VT with a rate of 220 bpm, followed by 4 beat run of VT.  Monitor tech is printing this event off now to show to our DOD for further review and recommendation on this pt.  Appears Dr [name] ordered for this pt to have an event monitor placed, from phone conversation placed on 4/17, when pt called in with of complaints of chest pain, sob, and increased palpitations. Pt had the monitor placed on 5/1.   Report just received, the pt was noted to be sinus rhythm, V-tach (28,4 beat runs) with rate of 220, this morning at 1026 CT.  This was auto-triggered. Awaiting follow-up.  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  Pt states she only felt light-headed at this time, and had no chest pain or sob.  Pt had no pre-syncope or syncopal episodes.  Pt states that what she felt this morning, is nothing to what she felt the day she called into the office and requested Dr [name] to order a monitor for her. Monitor ordered on 4/17 and placed 5/1. Informed the pt that Dr [name] and RN are out of the office at this time, but I will go and show our DOD Dr Lovena Le her monitor for further review and recommendation.  Informed the pt that I will follow-up with her shortly thereafter.  Pt verbalized understanding and agrees with this plan.

## 2018-04-27 NOTE — Telephone Encounter (Signed)
Showed to the DOD Dr. Lovena Le, the pts event recording, and per Dr. Lovena Le, no further changes to be made at this time, continue monitoring and using PRN propranolol.  Endorsed this to the pt and she agreed to this plan.  Will route this message to Dr [name] and Sharyn Lull RN, for their review upon return to the office.  Did advise the pt that if her symptoms worsen, she should push the button on her event monitor, and call our office back to speak with a triage nurse during business hours, and on-call Provider after 5 pm.  Also advised the pt that she should be mindful and take her BP, prior to administering PRN propranolol. Pt education provided on acceptable BP and HR values.   Pt agreed to this plan.

## 2018-04-30 ENCOUNTER — Other Ambulatory Visit: Payer: 59 | Admitting: *Deleted

## 2018-04-30 ENCOUNTER — Ambulatory Visit (INDEPENDENT_AMBULATORY_CARE_PROVIDER_SITE_OTHER): Payer: 59 | Admitting: Nurse Practitioner

## 2018-04-30 ENCOUNTER — Encounter: Payer: Self-pay | Admitting: Nurse Practitioner

## 2018-04-30 ENCOUNTER — Telehealth (HOSPITAL_COMMUNITY): Payer: Self-pay | Admitting: *Deleted

## 2018-04-30 VITALS — BP 130/84 | HR 64 | Ht 67.0 in | Wt 155.5 lb

## 2018-04-30 DIAGNOSIS — I472 Ventricular tachycardia, unspecified: Secondary | ICD-10-CM

## 2018-04-30 DIAGNOSIS — R002 Palpitations: Secondary | ICD-10-CM

## 2018-04-30 LAB — BASIC METABOLIC PANEL
BUN / CREAT RATIO: 12 (ref 9–23)
BUN: 8 mg/dL (ref 6–24)
CHLORIDE: 102 mmol/L (ref 96–106)
CO2: 23 mmol/L (ref 20–29)
Calcium: 9.4 mg/dL (ref 8.7–10.2)
Creatinine, Ser: 0.68 mg/dL (ref 0.57–1.00)
GFR calc Af Amer: 120 mL/min/{1.73_m2} (ref >59)
GFR calc non Af Amer: 104 mL/min/{1.73_m2} (ref >59)
GLUCOSE: 86 mg/dL (ref 65–99)
POTASSIUM: 3.9 mmol/L (ref 3.5–5.2)
SODIUM: 140 mmol/L (ref 134–144)

## 2018-04-30 LAB — TROPONIN T: Troponin T TROPT: <0.011 ng/mL (ref <0.011)

## 2018-04-30 LAB — TSH: TSH: 1.29 u[IU]/mL (ref 0.450–4.500)

## 2018-04-30 LAB — MAGNESIUM: MAGNESIUM: 1.9 mg/dL (ref 1.6–2.3)

## 2018-04-30 MED ORDER — METOPROLOL TARTRATE 25 MG PO TABS: 25.0000 mg | ORAL_TABLET | Freq: Two times a day (BID) | ORAL | 3 refills | Status: DC

## 2018-04-30 NOTE — Telephone Encounter (Signed)
Called patient to review Dr. Elmarie Shiley advice with her. She states she is currently at work and will come to our office for ekg and lab work later this morning. She states she has occasional symptoms of a funny feeling in her chest and rare episodes of chest tightness. I advised her that if she has syncope or near-syncope that she should call 911. Husband works for EMS and they have a defibrillator at home. She denies symptoms of near-syncope. She verbalized understanding of plan to get exercise myoview at our office this week and that plan of care will be determined based on test results.

## 2018-04-30 NOTE — Patient Instructions (Signed)
Medication Instructions:  Your physician recommends that you continue on your current medications as directed. Please refer to the Current Medication list given to you today.   Labwork: TODAY - Troponin, Mag, BMET, TSH   Testing/Procedures: Your physician has requested that you have an exercise stress myoview on Wednesday May 8. For further information please visit [email or web address]. Please follow instruction sheet, as given.   Follow-Up: Your physician recommends that you schedule a follow-up appointment in: we will call you to schedule an appointment once your test results are available    If you need a refill on your cardiac medications before your next appointment, please call your pharmacy.   Thank you for choosing [facility]! Christen Bame, RN [phone]

## 2018-04-30 NOTE — Progress Notes (Signed)
1.) Reason for visit: EKG  2.) Name of MD requesting visit: Dr. [name]  3.) H&P: pt has hx MVP and recently had increased palpitations; had event monitor placed on 5/1  4.) ROS related to problem: cardiac monitor showed 28 beat 4 beat runs of v tach on 5/3 and patient was advised by Dr. Lovena Le, DOD, not to make any changes in medical treatment. Advised today by Dr. [name] to come in for ekg, lab work and to schedule exercise myoview which will occur on Wednesday. Patient denies s/s currently, states she has a funny feeling in her chest and notices short episodes of tachycardia. Denies syncope or near-syncope or dizziness/light-headedness  5.) Assessment and plan per MD: no changes in EKG from last ov; will get lab results and myoview results and will notify patient of follow-up accordingly

## 2018-04-30 NOTE — Addendum Note (Signed)
Addended by: Emmaline Life on: 04/30/2018 10:24 AM   Modules accepted: Orders

## 2018-04-30 NOTE — Telephone Encounter (Signed)
Patient given detailed instructions per Myocardial Perfusion Study Information Sheet for the test on 05/02/18 at 0730. Patient notified to arrive 15 minutes early and that it is imperative to arrive on time for appointment to keep from having the test rescheduled.  If you need to cancel or reschedule your appointment, please call the office within 24 hours of your appointment. . Patient verbalized understanding.Kara Owens W    

## 2018-04-30 NOTE — Telephone Encounter (Signed)
I've reviewed the strips  Need BMP, mag, TSH. Troponin ,      ECG Today  Metoprolol 25 BID -   If she becomes  hypotensive, reduce to 12. 5 BID   Coronary CT if we can do this week .  Otherwise  Stress myoivew  Office visit soon .

## 2018-05-02 ENCOUNTER — Ambulatory Visit (HOSPITAL_COMMUNITY): Payer: 59 | Attending: Cardiovascular Disease

## 2018-05-02 DIAGNOSIS — I472 Ventricular tachycardia, unspecified: Secondary | ICD-10-CM

## 2018-05-02 LAB — MYOCARDIAL PERFUSION IMAGING
CHL CUP MPHR: 172 {beats}/min
CHL CUP NUCLEAR SSS: 8
CHL CUP RESTING HR STRESS: 86 {beats}/min
CSEPED: 9 min
CSEPEDS: 0 s
CSEPEW: 10.1 METS
LV sys vol: 50 mL
LVDIAVOL: 123 mL (ref 46–106)
Peak HR: 171 {beats}/min
Percent HR: 99 %
RATE: 0.28
SDS: 6
SRS: 2
TID: 1.01

## 2018-05-02 MED ORDER — TECHNETIUM TC 99M TETROFOSMIN IV KIT
10.2000 | PACK | Freq: Once | INTRAVENOUS | Status: AC | PRN
  Administered 2018-05-02: 10.2 via INTRAVENOUS
  Filled 2018-05-02: qty 11

## 2018-05-02 MED ORDER — TECHNETIUM TC 99M TETROFOSMIN IV KIT
31.1000 | PACK | Freq: Once | INTRAVENOUS | Status: AC | PRN
  Administered 2018-05-02: 31.1 via INTRAVENOUS
  Filled 2018-05-02: qty 32

## 2018-05-02 NOTE — Progress Notes (Unsigned)
Pregnancy test done prior to Nuclear Imaging test- test results are negative.  Kara Owens

## 2018-05-07 ENCOUNTER — Telehealth: Payer: Self-pay | Admitting: Nurse Practitioner

## 2018-05-07 MED ORDER — METOPROLOL TARTRATE 25 MG PO TABS: 37.5000 mg | ORAL_TABLET | Freq: Two times a day (BID) | ORAL | 3 refills | Status: DC

## 2018-05-07 NOTE — Telephone Encounter (Signed)
Received monitor report from triage nurse, Frederik Schmidt, RN, on patient. Dr. [name] reviewed patient's tracing from 5/11 @ [id number] (CT) of SR with 7 beat run of V tach. He advised patient increase Metoprolol to 37.5 mg BID. I called patient and asked how she is tolerating the current dose of Metoprolol 25 mg BID. She states she has been feeling well until this morning when she noticed symptoms of feeling poor 30 minutes after taking her metoprolol. I reviewed Dr. Elmarie Shiley advice with her and we discussed the alternative of taking an additional Metoprolol 25 mg tab at lunch or increasing to 37.5 mg twice daily. She states she will start with taking the extra 25 mg tab at lunch and is aware she may adjust dose to taking an extra 1/2 tab (12.5 mg) twice daily if she does not tolerate the lunch dose. She is aware to call back with questions or concerns and thanked me for the call.

## 2018-05-11 ENCOUNTER — Telehealth: Payer: Self-pay | Admitting: Cardiovascular Disease

## 2018-05-11 NOTE — Telephone Encounter (Signed)
New Message     Cardiac Ct June 10th 130pm, patient needs written copy of instructions please

## 2018-05-15 ENCOUNTER — Encounter: Payer: Self-pay | Admitting: Nurse Practitioner

## 2018-05-15 NOTE — Telephone Encounter (Signed)
Called patient and reviewed coronary CT instructions with her. I advised I am also placing a copy in the mail for her and advised her to call back with questions or concerns. She verbalized understanding and thanked me for the call.

## 2018-05-22 ENCOUNTER — Telehealth: Payer: Self-pay

## 2018-05-22 MED ORDER — METOPROLOL TARTRATE 25 MG PO TABS: 25.0000 mg | ORAL_TABLET | Freq: Three times a day (TID) | ORAL | 3 refills | Status: DC

## 2018-05-22 NOTE — Telephone Encounter (Signed)
Monitor report received (no call).  At 0144 CT this AM, Kara Owens had a 6 beat run of VT. Spoke with the patient. She was asleep and asymptomatic. It did not wake her up. Confirmed with her she was taking metoprolol 25 mg TID.  Reviewed with Dr. [name]. Per Dr. [name], instructed patient to INCREASE METOPROLOL to 50 mg BID and schedule follow-up appointment.  Called to speak with patient. She does not think she can tolerate increasing metoprolol as she was dizzy last night and her BP is running lower than usual.  Scheduled patient for follow-up with Dr. [name] after her CT. She understands she will be called with further medication instructions.

## 2018-05-22 NOTE — Telephone Encounter (Signed)
Spoke with patient who states she is due for the monitor to end this week. I reviewed this information with Dr. [name] who advised that patient continue Metoprolol 25 mg TID and call us prior to appointment with worsening symptoms or other concerns. He advised that today's episode of VT was less than the previous two and she verbalized understanding. She agrees with plan of treatment and thanked me for the call.

## 2018-06-04 ENCOUNTER — Ambulatory Visit (HOSPITAL_COMMUNITY)
Admission: RE | Admit: 2018-06-04 | Discharge: 2018-06-04 | Disposition: A | Discharge status: Home / Self Care | Payer: 59 | Source: Ambulatory Visit | Attending: Cardiovascular Disease | Admitting: Cardiovascular Disease

## 2018-06-04 DIAGNOSIS — I472 Ventricular tachycardia, unspecified: Secondary | ICD-10-CM

## 2018-06-04 DIAGNOSIS — R002 Palpitations: Secondary | ICD-10-CM | POA: Diagnosis not present

## 2018-06-04 DIAGNOSIS — R079 Chest pain, unspecified: Secondary | ICD-10-CM | POA: Diagnosis not present

## 2018-06-04 MED ORDER — NITROGLYCERIN 0.4 MG SL SUBL
0.8000 mg | SUBLINGUAL_TABLET | Freq: Once | SUBLINGUAL | Status: AC
  Administered 2018-06-04: 0.8 mg via SUBLINGUAL
  Filled 2018-06-04: qty 25

## 2018-06-04 MED ORDER — IOPAMIDOL (ISOVUE-370) INJECTION 76%
INTRAVENOUS | Status: AC
  Administered 2018-06-04: 14:00:00
  Filled 2018-06-04: qty 100

## 2018-06-04 MED ORDER — NITROGLYCERIN 0.4 MG SL SUBL
SUBLINGUAL_TABLET | SUBLINGUAL | Status: AC
  Filled 2018-06-04: qty 2

## 2018-06-04 NOTE — Progress Notes (Signed)
CT complete. Patient denies any complaints. Patient eating cookies and drinking diet coke.

## 2018-06-04 NOTE — Progress Notes (Signed)
Patient ambulatory out of department with steady gait. Spouse leaving with patient.

## 2018-06-08 ENCOUNTER — Ambulatory Visit: Payer: 59 | Admitting: Cardiovascular Disease

## 2018-06-08 ENCOUNTER — Encounter: Payer: Self-pay | Admitting: Cardiovascular Disease

## 2018-06-08 VITALS — BP 110/86 | HR 50 | Ht 68.0 in | Wt 156.1 lb

## 2018-06-08 DIAGNOSIS — I341 Nonrheumatic mitral (valve) prolapse: Secondary | ICD-10-CM | POA: Diagnosis not present

## 2018-06-08 DIAGNOSIS — I472 Ventricular tachycardia, unspecified: Secondary | ICD-10-CM

## 2018-06-08 NOTE — Patient Instructions (Signed)
Medication Instructions:  Your physician recommends that you continue on your current medications as directed. Please refer to the Current Medication list given to you today.   Labwork: None Ordered   Testing/Procedures: Your physician has requested that you have a cardiac MRI. Cardiac MRI uses a computer to create images of your heart as its beating, producing both still and moving pictures of your heart and major blood vessels. For further information please visit [email or web address] Please follow the instruction sheet given to you today for more information.   Follow-Up: Your physician recommends that you schedule a follow-up appointment in: 4-6 weeks with Dr. [name] (after your MRI)   If you need a refill on your cardiac medications before your next appointment, please call your pharmacy.   Thank you for choosing [facility]! Christen Bame, RN [phone]

## 2018-06-08 NOTE — Progress Notes (Signed)
Cardiology Office Note:    Date:  06/08/2018   ID:  Kara Owens, DOB Oct 22, 1971, MRN [id number]  PCP:  [name], MD  Cardiologist:  [name], MD    Referring MD: [name], MD   Chief Complaint  Patient presents with  . Mitral Valve Prolapse  . Chest Pain        Kara Owens is a 47 y.o. female with a hx of mitral valve prolapse.  I last saw Kara Owens in 2016.  He has a history of mitral valve prolapse with mild mitral regurgitation.  Mother [name] ) had an ablation last year. Brother had an MI at age 5. ( non smoker, HTN, obese)  Had labs at [name] [facility] office last week.  Had some pleuretic CP last week,    Was given prednisone which helped No cough, cold, no hemoptysis,  No fever, Pain was worse at nihgt.   Trying to exercise  Works in [employer].     Labs from Primary MD   Total chol = 187 Trigs = 70 HDL = 82 LDL = 91   February 23, 2018 Seen with husband  BP was elevated at the TEE  Brought her BP log , all readings were great  Is not exercising regularly   June 08, 2018:  Son is seen back today for follow-up of her mitral valve prolapse.  She has worn an event monitor and was found to have episodes of nonsustained ventricular tachycardia.  We started her on metoprolol 25 mg twice a day which seemed to help with the episodes of nonsustained VT.  We tried increasing the dose to 25 mg 3 times a day but she does  not tolerate this vey well at all due to profound fatigue   She had a stress Myoview study on May 8 which revealed an anterior defect that was thought to be due to breast artifact.    Coronary CT angiogram revealed normal coronary arteries.  She had a coronary calcium score of 0.  Had an episode of chest tightness.   Occurs after the palpitations. Last for a few minutes. Marland Kitchen   Resolves with deep breathing .   Past Medical History:  Diagnosis Date  . Chest pain    Left sided--sharp pain  . Fatigue    Extreme  .  Mitral regurgitation    Mild  . Mitral valve prolapse   . Palpitations     Past Surgical History:  Procedure Laterality Date  . TEE WITHOUT CARDIOVERSION N/A 12/08/2017   Procedure: TRANSESOPHAGEAL ECHOCARDIOGRAM (TEE);  Surgeon: [name], MD;  Location: [facility] ENDOSCOPY;  Service: Cardiovascular;  Laterality: N/A;  . TONSILLECTOMY      Current Medications: Current Meds  Medication Sig  . Ascorbic Acid (VITAMIN C PO) Take 1 tablet by mouth daily at 3 pm.  . Calcium Carbonate-Vitamin D (CALCIUM-D PO) Take 1 tablet by mouth daily at 3 pm.  . metoprolol tartrate (LOPRESSOR) 25 MG tablet Take 1 tablet (25 mg total) by mouth 3 (three) times daily.  . Multiple Vitamin (MULTIVITAMIN WITH MINERALS) TABS tablet Take 1 tablet by mouth daily at 3 pm.  . Pediatric Multivitamins-Iron (FLINTSTONES PLUS IRON) chewable tablet Chew 1 tablet by mouth daily at 3 pm.  . polyethylene glycol (MIRALAX / GLYCOLAX) packet Take 17 g by mouth daily.      Allergies:   Patient has no known allergies.   Social History   Socioeconomic History  . Marital  status: Married    Spouse name: Not on file  . Number of children: Not on file  . Years of education: Not on file  . Highest education level: Not on file  Occupational History  . Not on file  Social Needs  . Financial resource strain: Not on file  . Food insecurity:    Worry: Not on file    Inability: Not on file  . Transportation needs:    Medical: Not on file    Non-medical: Not on file  Tobacco Use  . Smoking status: Never Smoker  . Smokeless tobacco: Never Used  Substance and Sexual Activity  . Alcohol use: Yes  . Drug use: Not on file  . Sexual activity: Not on file  Lifestyle  . Physical activity:    Days per week: Not on file    Minutes per session: Not on file  . Stress: Not on file  Relationships  . Social connections:    Talks on phone: Not on file    Gets together: Not on file    Attends religious service: Not on file     Active member of club or organization: Not on file    Attends meetings of clubs or organizations: Not on file    Relationship status: Not on file  Other Topics Concern  . Not on file  Social History Narrative  . Not on file     Family History: The patient's family history includes Heart attack in her brother; Heart failure in her mother; Hypertension in her father and mother; Skin cancer in her father. ROS:   Please see the history of present illness.     All other systems reviewed and are negative.  EKGs/Labs/Other Studies Reviewed:    The following studies were reviewed today:   EKG:     Recent Labs: 12/06/2017: Hemoglobin 12.7; Platelets 224 04/30/2018: BUN 8; Creatinine, Ser 0.68; Magnesium 1.9; Potassium 3.9; Sodium 140; TSH 1.290  Recent Lipid Panel    Component Value Date/Time   CHOL 175 09/12/2008 0913   TRIG 40 09/12/2008 0913   HDL 57.8 09/12/2008 0913   CHOLHDL 3.0 CALC 09/12/2008 0913   VLDL 8 09/12/2008 0913   LDLCALC 109 (H) 09/12/2008 0913   Physical Exam: Blood pressure 110/86, pulse (!) 50, height 5\' 8"  (1.727 m), weight 156 lb 1.9 oz (70.8 kg), SpO2 99 %.  GEN:  Well nourished, well developed in no acute distress HEENT: Normal NECK: No JVD; No carotid bruits LYMPHATICS: No lymphadenopathy CARDIAC: RRR  RESPIRATORY:  Clear to auscultation without rales, wheezing or rhonchi  ABDOMEN: Soft, non-tender, non-distended MUSCULOSKELETAL:  No edema; No deformity  SKIN: Warm and dry NEUROLOGIC:  Alert and oriented x 3   ASSESSMENT:    1. Ventricular tachycardia ([place])    PLAN:    In order of problems listed above:  1.  Nonsustained ventricular tachycardia: Event monitor reveals several episodes of nonsustained ventricular tachycardia.  Her longest episode has been 28 beats.  We have started her on metoprolol which seems to have helped with the palpitations.  She still having lots of ectopy. We have increased the metoprolol to 25 mg 3 times a day which  she does not tolerate very well.  She is very fatigued. I discussed the case with Dr. Lovena Le.  We will get a cardiac MRI to evaluate her cardiac structure.  Continue current dose of metoprolol for now. It would be nice to be able to find another drug that may help  her since she does not tolerate metoprolol very well.  We will wait and see what the MRI shows.  2.  Mitral regurgitation:  Has moderate MR .  Stable  3.  .  Family history of cardiac disease :    Total chol = 187 Trigs = 70 HDL = 82 LDL = 91  Will see her in 3-4 weeks .    Medication Adjustments/Labs and Tests Ordered: Current medicines are reviewed at length with the patient today.  Concerns regarding medicines are outlined above.  Orders Placed This Encounter  Procedures  . MR CARDIAC MORPHOLOGY W WO CONTRAST   No orders of the defined types were placed in this encounter.   Signed, [name], MD  06/08/2018 11:32 AM    [place]

## 2018-06-11 ENCOUNTER — Other Ambulatory Visit: Payer: Self-pay | Admitting: *Deleted

## 2018-06-11 MED ORDER — METOPROLOL TARTRATE 25 MG PO TABS: 25.0000 mg | ORAL_TABLET | Freq: Three times a day (TID) | ORAL | 3 refills | Status: DC

## 2018-06-11 NOTE — Telephone Encounter (Signed)
Patient called and stated that her metoprolol was increased to 25 mg tid, but the pharmacy never received an updated rx. She tried to refill the old rx but was informed that it was to soon to refill. She is aware that I will send in a new ninety day rx as requested. Patient very appreciative.

## 2018-06-12 ENCOUNTER — Encounter: Payer: Self-pay | Admitting: Cardiovascular Disease

## 2018-06-12 ENCOUNTER — Telehealth: Payer: Self-pay | Admitting: Cardiovascular Disease

## 2018-06-12 NOTE — Telephone Encounter (Signed)
On 06-11-18 I called the patient and asked her what time of day/days she would like the MRI scheduled.  She stated any time/any day.  Today, 06-12-18 I called the patient to give her the date 06-11-18 at 8 a.m., and she stated she would not be home that week.  Sent another staff message to the technician to reschedule the appointment.

## 2018-06-14 ENCOUNTER — Encounter: Payer: Self-pay | Admitting: Cardiovascular Disease

## 2018-06-22 ENCOUNTER — Other Ambulatory Visit (HOSPITAL_COMMUNITY): Payer: 59

## 2018-07-02 ENCOUNTER — Ambulatory Visit (HOSPITAL_COMMUNITY)
Admission: RE | Admit: 2018-07-02 | Discharge: 2018-07-02 | Disposition: A | Discharge status: Home / Self Care | Payer: 59 | Source: Ambulatory Visit | Attending: Cardiovascular Disease | Admitting: Cardiovascular Disease

## 2018-07-02 DIAGNOSIS — I341 Nonrheumatic mitral (valve) prolapse: Secondary | ICD-10-CM | POA: Insufficient documentation

## 2018-07-02 DIAGNOSIS — I340 Nonrheumatic mitral (valve) insufficiency: Secondary | ICD-10-CM | POA: Diagnosis not present

## 2018-07-02 DIAGNOSIS — I472 Ventricular tachycardia, unspecified: Secondary | ICD-10-CM

## 2018-07-02 LAB — POCT I-STAT CREATININE: CREATININE: 0.6 mg/dL (ref 0.44–1.00)

## 2018-07-02 MED ORDER — GADOBENATE DIMEGLUMINE 529 MG/ML IV SOLN
22.0000 mL | Freq: Once | INTRAVENOUS | Status: AC | PRN
  Administered 2018-07-02: 22 mL via INTRAVENOUS

## 2018-07-04 ENCOUNTER — Telehealth: Payer: Self-pay | Admitting: Cardiovascular Disease

## 2018-07-04 MED ORDER — METOPROLOL TARTRATE 25 MG PO TABS: 25.0000 mg | ORAL_TABLET | Freq: Two times a day (BID) | ORAL | 3 refills | Status: DC

## 2018-07-04 NOTE — Telephone Encounter (Signed)
Reviewed MRI results with patient. Per Dr. [name], he would like to see her soon to discuss treatment plan for MR unless patient is asymptomatic. Patient reports that she went for a 1 mile walk last week and felt that she couldn't go on any further. She states her HR is generally low 50's bpm and she is unable to get it high enough to make the exercise ring of her Apple watch move. She states she occasionally has HR of 140's but this occurs randomly and not with exercise. She reports feeling fatigued and is uncertain whether it is a SOB or just a generalized malaise. She associates worsening symptoms with higher dose of Metoprolol. I scheduled her to see Dr. [name] on 7/16 and advised that I will ask if she can reduce Metoprolol dose prior to appointment. She verbalized understanding and agreement with plan and thanked me for the call.

## 2018-07-04 NOTE — Telephone Encounter (Signed)
-----   Message from Thayer Headings, MD sent at 07/04/2018  8:38 AM EDT ----- The MRI does not show any evidence of myocardial scarring.  This is good news -  This suggests that her episodes of nonsustained VT are not serious at this time The MRI suggests that the mitral regurg is more severe than was seen at TEE in Dec. I will review the TEE again.    Continue current meds.  I'll see her back for follow up appt as scheduled.

## 2018-07-04 NOTE — Telephone Encounter (Signed)
Her symptoms  may be related to her mitral regurgitation or may be the higher dose of metoprolol.   She may decrease the metoprolol to 25 mg BID if she would like.   That may give her more energy

## 2018-07-04 NOTE — Telephone Encounter (Signed)
Reviewed Dr. Elmarie Shiley advice with patient who verbalized understanding and agreement to decrease Lopressor to 25 mg BID. She will follow up with Dr. [name] on 7/16 and I advised her to call back sooner with questions or concerns. She thanked me for the call.

## 2018-07-04 NOTE — Telephone Encounter (Signed)
New message   Please return call to patient with MR results

## 2018-07-10 ENCOUNTER — Ambulatory Visit: Payer: 59 | Admitting: Cardiovascular Disease

## 2018-07-10 ENCOUNTER — Encounter: Payer: Self-pay | Admitting: Cardiovascular Disease

## 2018-07-10 VITALS — BP 102/70 | HR 62 | Ht 68.0 in | Wt 157.0 lb

## 2018-07-10 DIAGNOSIS — I340 Nonrheumatic mitral (valve) insufficiency: Secondary | ICD-10-CM

## 2018-07-10 DIAGNOSIS — I341 Nonrheumatic mitral (valve) prolapse: Secondary | ICD-10-CM

## 2018-07-10 NOTE — Progress Notes (Signed)
Cardiology Office Note:    Date:  07/10/2018   ID:  Kara Owens, DOB 10/26/1971, MRN [id number]  PCP:  [name], MD  Cardiologist:  [name], MD    Referring MD: [name], MD   Chief Complaint  Patient presents with  . Mitral Valve Prolapse        Kara Owens is a 47 y.o. female with a hx of mitral valve prolapse.  I last saw Kara Owens in 2016.  He has a history of mitral valve prolapse with mild mitral regurgitation.  Mother Kara Owens ) had an ablation last year. Brother had an MI at age 60. ( non smoker, HTN, obese)  Had labs at [name] [facility] office last week.  Had some pleuretic CP last week,    Was given prednisone which helped No cough, cold, no hemoptysis,  No fever, Pain was worse at nihgt.   Trying to exercise  Works in [employer].     Labs from Primary MD   Total chol = 187 Trigs = 70 HDL = 82 LDL = 91   February 23, 2018 Seen with husband  BP was elevated at the TEE  Brought her BP log , all readings were great  Is not exercising regularly   June 08, 2018:  Son is seen back today for follow-up of her mitral valve prolapse.  She has worn an event monitor and was found to have episodes of nonsustained ventricular tachycardia.  We started her on metoprolol 25 mg twice a day which seemed to help with the episodes of nonsustained VT.  We tried increasing the dose to 25 mg 3 times a day but she does  not tolerate this vey well at all due to profound fatigue   She had a stress Myoview study on May 8 which revealed an anterior defect that was thought to be due to breast artifact.    Coronary CT angiogram revealed normal coronary arteries.  She had a coronary calcium score of 0.  Had an episode of chest tightness.   Occurs after the palpitations. Last for a few minutes. Marland Kitchen   Resolves with deep breathing .  July 10, 2018:  Seen with husband , Kara Owens She has a little bit more energy now that she has reduce the metoprolol from 25 mg 3  times daily to 25 mg twice daily.  She still has palpitations He thinks the palpitations may have worsened slightly since reducing the dose of metoprolol.  Past Medical History:  Diagnosis Date  . Chest pain    Left sided--sharp pain  . Fatigue    Extreme  . Mitral regurgitation    Mild  . Mitral valve prolapse   . Palpitations     Past Surgical History:  Procedure Laterality Date  . TEE WITHOUT CARDIOVERSION N/A 12/08/2017   Procedure: TRANSESOPHAGEAL ECHOCARDIOGRAM (TEE);  Surgeon: [name], MD;  Location: [facility] ENDOSCOPY;  Service: Cardiovascular;  Laterality: N/A;  . TONSILLECTOMY      Current Medications: Current Meds  Medication Sig  . Ascorbic Acid (VITAMIN C PO) Take 1 tablet by mouth daily at 3 pm.  . Calcium Carbonate-Vitamin D (CALCIUM-D PO) Take 1 tablet by mouth daily at 3 pm.  . metoprolol tartrate (LOPRESSOR) 25 MG tablet Take 1 tablet (25 mg total) by mouth 2 (two) times daily.  . Multiple Vitamin (MULTIVITAMIN WITH MINERALS) TABS tablet Take 1 tablet by mouth daily at 3 pm.  . Pediatric Multivitamins-Iron (FLINTSTONES PLUS IRON)  chewable tablet Chew 1 tablet by mouth daily at 3 pm.  . polyethylene glycol (MIRALAX / GLYCOLAX) packet Take 17 g by mouth daily.      Allergies:   Patient has no known allergies.   Social History   Socioeconomic History  . Marital status: Married    Spouse name: Not on file  . Number of children: Not on file  . Years of education: Not on file  . Highest education level: Not on file  Occupational History  . Not on file  Social Needs  . Financial resource strain: Not on file  . Food insecurity:    Worry: Not on file    Inability: Not on file  . Transportation needs:    Medical: Not on file    Non-medical: Not on file  Tobacco Use  . Smoking status: Never Smoker  . Smokeless tobacco: Never Used  Substance and Sexual Activity  . Alcohol use: Yes  . Drug use: Not on file  . Sexual activity: Not on file  Lifestyle    . Physical activity:    Days per week: Not on file    Minutes per session: Not on file  . Stress: Not on file  Relationships  . Social connections:    Talks on phone: Not on file    Gets together: Not on file    Attends religious service: Not on file    Active member of club or organization: Not on file    Attends meetings of clubs or organizations: Not on file    Relationship status: Not on file  Other Topics Concern  . Not on file  Social History Narrative  . Not on file     Family History: The patient's family history includes Heart attack in her brother; Heart failure in her mother; Hypertension in her father and mother; Skin cancer in her father. ROS:   Please see the history of present illness.     All other systems reviewed and are negative.  EKGs/Labs/Other Studies Reviewed:    The following studies were reviewed today:   EKG:     Recent Labs: 12/06/2017: Hemoglobin 12.7; Platelets 224 04/30/2018: BUN 8; Magnesium 1.9; Potassium 3.9; Sodium 140; TSH 1.290 07/02/2018: Creatinine, Ser 0.60  Recent Lipid Panel    Component Value Date/Time   CHOL 175 09/12/2008 0913   TRIG 40 09/12/2008 0913   HDL 57.8 09/12/2008 0913   CHOLHDL 3.0 CALC 09/12/2008 0913   VLDL 8 09/12/2008 0913   LDLCALC 109 (H) 09/12/2008 0913    Physical Exam: Blood pressure 102/70, pulse 62, height 5\' 8"  (1.727 m), weight 157 lb (71.2 kg), SpO2 99 %.  GEN:  Well nourished, well developed in no acute distress HEENT: Normal NECK: No JVD; No carotid bruits LYMPHATICS: No lymphadenopathy CARDIAC: RR , 2-3 / 6 systolic murmur radiating to the axilla  RESPIRATORY:  Clear to auscultation without rales, wheezing or rhonchi  ABDOMEN: Soft, non-tender, non-distended MUSCULOSKELETAL:  No edema; No deformity  SKIN: Warm and dry NEUROLOGIC:  Alert and oriented x 3    ASSESSMENT:    1. Moderate to severe mitral regurgitation    PLAN:    In order of problems listed above:  1.  Nonsustained  ventricular tachycardia:   MRI shows no scarring in the LV .  LV did look mildly dilated.   2.  Mitral regurgitation:  Has moderate - severe MR .   Recent  MRI suggested that the mitral regurgitation was more severe  although volumetric qualification was not performed.  We will schedule her for transesophageal echo on August 2.  We will refer her to Dr. Roxy Manns  For his opinion.  She has been having some shortness of breath with exertion.  She is not able to walk a mile without stopping for shortness of breath.  She also short of breath climbing 2 flights of stairs. Suspect that her ventricular irritability is also due to LV strain.  My hope is that these will improve/resolve after mitral valve repair.     3.  .  Family history of cardiac disease :        Medication Adjustments/Labs and Tests Ordered: Current medicines are reviewed at length with the patient today.  Concerns regarding medicines are outlined above.  Orders Placed This Encounter  Procedures  . Basic Metabolic Panel (BMET)  . CBC  . Ambulatory referral to Cardiothoracic Surgery   No orders of the defined types were placed in this encounter.     Signed, [name], MD  07/10/2018 6:31 PM    [place]

## 2018-07-10 NOTE — Patient Instructions (Signed)
Medication Instructions:  Your physician recommends that you continue on your current medications as directed. Please refer to the Current Medication list given to you today.   Labwork: Your physician recommends that you return for lab work on Tuesday July 30 - you may come in anytime between 7:30 am and 4:30 pm You do not have to fast for this appointment    Testing/Procedures: Your physician has requested that you have a TEE. During a TEE, sound waves are used to create images of your heart. It provides your doctor with information about the size and shape of your heart and how well your heart's chambers and valves are working. In this test, a transducer is attached to the end of a flexible tube that's guided down your throat and into your esophagus (the tube leading from you mouth to your stomach) to get a more detailed image of your heart. You are not awake for the procedure. Please see the instruction sheet given to you today. For further information please visit [email or web address].    Follow-Up: You have been referred to [facility], Dr. Roxy Manns  Your physician recommends that you schedule a follow-up appointment in: 3 months with Dr. Vilinda Boehringer are scheduled for a TEE on Friday August 2 with Dr. [name].  Please arrive at the [facility] (Main Entrance A) at [facility]: [address] [place], [place] [address] at 7:00 am. (1 hour prior to procedure unless lab work is needed; if lab work is needed arrive 1.5 hours ahead)  DIET: Nothing to eat or drink after midnight except a sip of water with medications (see medication instructions below)  Medication Instructions: May take morning medications with a sip of water   Labs:  For patients receiving anesthesia for TEE and all Cardioversion patients: BMET, CBC within 1 week   Come to the lab at [facility] between the hours of 8:00 am and 4:30 pm. You do not have to be fasting.  You must have a  responsible person to drive you home and stay in the waiting area during your procedure. Failure to do so could result in cancellation.  Bring your insurance cards.  *Special Note: Every effort is made to have your procedure done on time. Occasionally there are emergencies that occur at the hospital that may cause delays. Please be patient if a delay does occur.    If you need a refill on your cardiac medications before your next appointment, please call your pharmacy.   Thank you for choosing [facility]! Christen Bame, RN [phone]

## 2018-07-10 NOTE — H&P (View-Only) (Signed)
Cardiology Office Note:    Date:  07/10/2018   ID:  Kara Owens, DOB 05/27/1971, MRN [id number]  PCP:  [name], MD  Cardiologist:  [name], MD    Referring MD: [name], MD   Chief Complaint  Patient presents with  . Mitral Valve Prolapse        Kara Owens is a 47 y.o. female with a hx of mitral valve prolapse.  I last saw Kara Owens in 2016.  He has a history of mitral valve prolapse with mild mitral regurgitation.  Mother [name] ) had an ablation last year. Brother had an MI at age 75. ( non smoker, HTN, obese)  Had labs at [name] [facility] office last week.  Had some pleuretic CP last week,    Was given prednisone which helped No cough, cold, no hemoptysis,  No fever, Pain was worse at nihgt.   Trying to exercise  Works in [employer].     Labs from Primary MD   Total chol = 187 Trigs = 70 HDL = 82 LDL = 91   February 23, 2018 Seen with husband  BP was elevated at the TEE  Brought her BP log , all readings were great  Is not exercising regularly   June 08, 2018:  Son is seen back today for follow-up of her mitral valve prolapse.  She has worn an event monitor and was found to have episodes of nonsustained ventricular tachycardia.  We started her on metoprolol 25 mg twice a day which seemed to help with the episodes of nonsustained VT.  We tried increasing the dose to 25 mg 3 times a day but she does  not tolerate this vey well at all due to profound fatigue   She had a stress Myoview study on May 8 which revealed an anterior defect that was thought to be due to breast artifact.    Coronary CT angiogram revealed normal coronary arteries.  She had a coronary calcium score of 0.  Had an episode of chest tightness.   Occurs after the palpitations. Last for a few minutes. Marland Kitchen   Resolves with deep breathing .  July 10, 2018:  Seen with husband , Kara Owens She has a little bit more energy now that she has reduce the metoprolol from 25 mg 3  times daily to 25 mg twice daily.  She still has palpitations He thinks the palpitations may have worsened slightly since reducing the dose of metoprolol.  Past Medical History:  Diagnosis Date  . Chest pain    Left sided--sharp pain  . Fatigue    Extreme  . Mitral regurgitation    Mild  . Mitral valve prolapse   . Palpitations     Past Surgical History:  Procedure Laterality Date  . TEE WITHOUT CARDIOVERSION N/A 12/08/2017   Procedure: TRANSESOPHAGEAL ECHOCARDIOGRAM (TEE);  Surgeon: [name], MD;  Location: [facility] ENDOSCOPY;  Service: Cardiovascular;  Laterality: N/A;  . TONSILLECTOMY      Current Medications: Current Meds  Medication Sig  . Ascorbic Acid (VITAMIN C PO) Take 1 tablet by mouth daily at 3 pm.  . Calcium Carbonate-Vitamin D (CALCIUM-D PO) Take 1 tablet by mouth daily at 3 pm.  . metoprolol tartrate (LOPRESSOR) 25 MG tablet Take 1 tablet (25 mg total) by mouth 2 (two) times daily.  . Multiple Vitamin (MULTIVITAMIN WITH MINERALS) TABS tablet Take 1 tablet by mouth daily at 3 pm.  . Pediatric Multivitamins-Iron (FLINTSTONES PLUS IRON)  chewable tablet Chew 1 tablet by mouth daily at 3 pm.  . polyethylene glycol (MIRALAX / GLYCOLAX) packet Take 17 g by mouth daily.      Allergies:   Patient has no known allergies.   Social History   Socioeconomic History  . Marital status: Married    Spouse name: Not on file  . Number of children: Not on file  . Years of education: Not on file  . Highest education level: Not on file  Occupational History  . Not on file  Social Needs  . Financial resource strain: Not on file  . Food insecurity:    Worry: Not on file    Inability: Not on file  . Transportation needs:    Medical: Not on file    Non-medical: Not on file  Tobacco Use  . Smoking status: Never Smoker  . Smokeless tobacco: Never Used  Substance and Sexual Activity  . Alcohol use: Yes  . Drug use: Not on file  . Sexual activity: Not on file  Lifestyle    . Physical activity:    Days per week: Not on file    Minutes per session: Not on file  . Stress: Not on file  Relationships  . Social connections:    Talks on phone: Not on file    Gets together: Not on file    Attends religious service: Not on file    Active member of club or organization: Not on file    Attends meetings of clubs or organizations: Not on file    Relationship status: Not on file  Other Topics Concern  . Not on file  Social History Narrative  . Not on file     Family History: The patient's family history includes Heart attack in her brother; Heart failure in her mother; Hypertension in her father and mother; Skin cancer in her father. ROS:   Please see the history of present illness.     All other systems reviewed and are negative.  EKGs/Labs/Other Studies Reviewed:    The following studies were reviewed today:   EKG:     Recent Labs: 12/06/2017: Hemoglobin 12.7; Platelets 224 04/30/2018: BUN 8; Magnesium 1.9; Potassium 3.9; Sodium 140; TSH 1.290 07/02/2018: Creatinine, Ser 0.60  Recent Lipid Panel    Component Value Date/Time   CHOL 175 09/12/2008 0913   TRIG 40 09/12/2008 0913   HDL 57.8 09/12/2008 0913   CHOLHDL 3.0 CALC 09/12/2008 0913   VLDL 8 09/12/2008 0913   LDLCALC 109 (H) 09/12/2008 0913    Physical Exam: Blood pressure 102/70, pulse 62, height 5\' 8"  (1.727 m), weight 157 lb (71.2 kg), SpO2 99 %.  GEN:  Well nourished, well developed in no acute distress HEENT: Normal NECK: No JVD; No carotid bruits LYMPHATICS: No lymphadenopathy CARDIAC: RR , 2-3 / 6 systolic murmur radiating to the axilla  RESPIRATORY:  Clear to auscultation without rales, wheezing or rhonchi  ABDOMEN: Soft, non-tender, non-distended MUSCULOSKELETAL:  No edema; No deformity  SKIN: Warm and dry NEUROLOGIC:  Alert and oriented x 3    ASSESSMENT:    1. Moderate to severe mitral regurgitation    PLAN:    In order of problems listed above:  1.  Nonsustained  ventricular tachycardia:   MRI shows no scarring in the LV .  LV did look mildly dilated.   2.  Mitral regurgitation:  Has moderate - severe MR .   Recent  MRI suggested that the mitral regurgitation was more severe  although volumetric qualification was not performed.  We will schedule her for transesophageal echo on August 2.  We will refer her to Dr. Roxy Manns  For his opinion.  She has been having some shortness of breath with exertion.  She is not able to walk a mile without stopping for shortness of breath.  She also short of breath climbing 2 flights of stairs. Suspect that her ventricular irritability is also due to LV strain.  My hope is that these will improve/resolve after mitral valve repair.     3.  .  Family history of cardiac disease :        Medication Adjustments/Labs and Tests Ordered: Current medicines are reviewed at length with the patient today.  Concerns regarding medicines are outlined above.  Orders Placed This Encounter  Procedures  . Basic Metabolic Panel (BMET)  . CBC  . Ambulatory referral to Cardiothoracic Surgery   No orders of the defined types were placed in this encounter.     Signed, [name], MD  07/10/2018 6:31 PM    [place]

## 2018-07-24 ENCOUNTER — Other Ambulatory Visit: Payer: 59

## 2018-07-24 DIAGNOSIS — I340 Nonrheumatic mitral (valve) insufficiency: Secondary | ICD-10-CM

## 2018-07-24 LAB — BASIC METABOLIC PANEL
BUN/Creatinine Ratio: 17 (ref 9–23)
BUN: 12 mg/dL (ref 6–24)
CO2: 21 mmol/L (ref 20–29)
Calcium: 9.6 mg/dL (ref 8.7–10.2)
Chloride: 102 mmol/L (ref 96–106)
Creatinine, Ser: 0.7 mg/dL (ref 0.57–1.00)
GFR calc Af Amer: 118 mL/min/{1.73_m2} (ref >59)
GFR calc non Af Amer: 102 mL/min/{1.73_m2} (ref >59)
GLUCOSE: 78 mg/dL (ref 65–99)
POTASSIUM: 4.9 mmol/L (ref 3.5–5.2)
SODIUM: 141 mmol/L (ref 134–144)

## 2018-07-24 LAB — CBC
Hematocrit: 37.1 % (ref 34.0–46.6)
Hemoglobin: 12.7 g/dL (ref 11.1–15.9)
MCH: 30.3 pg (ref 26.6–33.0)
MCHC: 34.2 g/dL (ref 31.5–35.7)
MCV: 89 fL (ref 79–97)
PLATELETS: 223 10*3/uL (ref 150–450)
RBC: 4.19 x10E6/uL (ref 3.77–5.28)
RDW: 14.3 % (ref 12.3–15.4)
WBC: 4.2 10*3/uL (ref 3.4–10.8)

## 2018-07-25 DIAGNOSIS — H35413 Lattice degeneration of retina, bilateral: Secondary | ICD-10-CM | POA: Diagnosis not present

## 2018-07-27 ENCOUNTER — Ambulatory Visit (HOSPITAL_COMMUNITY)
Admission: RE | Admit: 2018-07-27 | Discharge: 2018-07-27 | Disposition: A | Discharge status: Home / Self Care | Payer: 59 | Source: Ambulatory Visit | Attending: Cardiovascular Disease | Admitting: Cardiovascular Disease

## 2018-07-27 ENCOUNTER — Encounter (HOSPITAL_COMMUNITY): Payer: Self-pay

## 2018-07-27 ENCOUNTER — Ambulatory Visit (HOSPITAL_BASED_OUTPATIENT_CLINIC_OR_DEPARTMENT_OTHER)
Admission: RE | Admit: 2018-07-27 | Discharge: 2018-07-27 | Disposition: A | Discharge status: Home / Self Care | Payer: 59 | Source: Ambulatory Visit | Attending: Cardiovascular Disease | Admitting: Cardiovascular Disease

## 2018-07-27 ENCOUNTER — Encounter (HOSPITAL_COMMUNITY)
Admission: RE | Disposition: A | Discharge status: Home / Self Care | Payer: Self-pay | Source: Ambulatory Visit | Attending: Cardiovascular Disease

## 2018-07-27 ENCOUNTER — Other Ambulatory Visit: Payer: Self-pay

## 2018-07-27 DIAGNOSIS — I472 Ventricular tachycardia: Secondary | ICD-10-CM | POA: Insufficient documentation

## 2018-07-27 DIAGNOSIS — I340 Nonrheumatic mitral (valve) insufficiency: Secondary | ICD-10-CM | POA: Diagnosis not present

## 2018-07-27 DIAGNOSIS — I341 Nonrheumatic mitral (valve) prolapse: Secondary | ICD-10-CM

## 2018-07-27 DIAGNOSIS — Z8249 Family history of ischemic heart disease and other diseases of the circulatory system: Secondary | ICD-10-CM | POA: Insufficient documentation

## 2018-07-27 HISTORY — PX: TEE WITHOUT CARDIOVERSION: SHX5443

## 2018-07-27 SURGERY — ECHOCARDIOGRAM, TRANSESOPHAGEAL
Anesthesia: Moderate Sedation

## 2018-07-27 MED ORDER — BUTAMBEN-TETRACAINE-BENZOCAINE 2-2-14 % EX AERO
INHALATION_SPRAY | CUTANEOUS | Status: DC | PRN
  Administered 2018-07-27: 2 via TOPICAL

## 2018-07-27 MED ORDER — FENTANYL CITRATE (PF) 100 MCG/2ML IJ SOLN
INTRAMUSCULAR | Status: DC | PRN
  Administered 2018-07-27 (×2): 25 ug via INTRAVENOUS

## 2018-07-27 MED ORDER — MIDAZOLAM HCL 10 MG/2ML IJ SOLN
INTRAMUSCULAR | Status: DC | PRN
  Administered 2018-07-27 (×2): 2 mg via INTRAVENOUS

## 2018-07-27 MED ORDER — FENTANYL CITRATE (PF) 100 MCG/2ML IJ SOLN
INTRAMUSCULAR | Status: AC
  Filled 2018-07-27: qty 2

## 2018-07-27 MED ORDER — MIDAZOLAM HCL 5 MG/ML IJ SOLN
INTRAMUSCULAR | Status: AC
  Filled 2018-07-27: qty 2

## 2018-07-27 NOTE — Discharge Instructions (Signed)

## 2018-07-27 NOTE — Progress Notes (Signed)
  Echocardiogram Echocardiogram Transesophageal has been performed.  Kara Owens 07/27/2018, 8:50 AM

## 2018-07-27 NOTE — Interval H&P Note (Signed)
History and Physical Interval Note:  07/27/2018 8:10 AM  Kara Owens  has presented today for surgery, with the diagnosis of moderate to severe mitral regurgitation  The various methods of treatment have been discussed with the patient and family. After consideration of risks, benefits and other options for treatment, the patient has consented to  Procedure(s): TRANSESOPHAGEAL ECHOCARDIOGRAM (TEE) (N/A) as a surgical intervention .  The patient's history has been reviewed, patient examined, no change in status, stable for surgery.  I have reviewed the patient's chart and labs.  Questions were answered to the patient's satisfaction.     [name]

## 2018-07-27 NOTE — CV Procedure (Signed)
    Transesophageal Echocardiogram Note  Kara Owens [id number] [birth year]  Procedure: Transesophageal Echocardiogram Indications: MVP , Mitral regurgitation  Procedure Details Consent: Obtained Time Out: Verified patient identification, verified procedure, site/side was marked, verified correct patient position, special equipment/implants available, Radiology Safety Procedures followed,  medications/allergies/relevent history reviewed, required imaging and test results available.  Performed  Medications:  During this procedure the patient is administered a total of Versed 4 mg and Fentanyl 50  mcg  to achieve and maintain moderate conscious sedation.  The patient's heart rate, blood pressure, and oxygen saturation are monitored continuously during the procedure. The period of conscious sedation is 30 minutes, of which I was present face-to-face 100% of this time.  Left Ventrical:  Normal LV Function  Mitral Valve: mod-severe prolapse of the anterior and posterior leaflets.  Severe MR.   There is reversal of flow in the pulmonary veins   Aortic Valve: normal 3 leaflet valve.   Trivial AI  Tricuspid Valve: mild TR   Pulmonic Valve: mild PI   Left Atrium/ Left atrial appendage: no thrombi   Atrial septum: no obvious PFO or ASD by color doppler   Aorta: normal    Complications: No apparent complications Patient did tolerate procedure well.   Thayer Headings, Brooke Bonito., MD, [facility] 07/27/2018, 8:30 AM

## 2018-07-29 ENCOUNTER — Encounter (HOSPITAL_COMMUNITY): Payer: Self-pay | Admitting: Cardiovascular Disease

## 2018-08-09 ENCOUNTER — Encounter: Payer: 59 | Admitting: Thoracic Surgery (Cardiothoracic Vascular Surgery)

## 2018-08-13 ENCOUNTER — Encounter: Payer: Self-pay | Admitting: Thoracic Surgery (Cardiothoracic Vascular Surgery)

## 2018-08-13 ENCOUNTER — Other Ambulatory Visit: Payer: Self-pay

## 2018-08-13 ENCOUNTER — Institutional Professional Consult (permissible substitution): Payer: 59 | Admitting: Thoracic Surgery (Cardiothoracic Vascular Surgery)

## 2018-08-13 VITALS — BP 110/78 | HR 61 | Resp 16 | Ht 68.0 in | Wt 155.0 lb

## 2018-08-13 DIAGNOSIS — I340 Nonrheumatic mitral (valve) insufficiency: Secondary | ICD-10-CM

## 2018-08-13 NOTE — Progress Notes (Signed)
[address]       [place] [address]             [phone]     CARDIOTHORACIC SURGERY CONSULTATION REPORT  Referring Provider is [name], [name], MD PCP is [name], MD  Chief Complaint  Patient presents with  . Mitral Regurgitation    new patient consult, Cardiac CT 6/10, TEE 8/2    HPI:  Patient is a 47 year old female with known history of mitral valve prolapse and mitral regurgitation who has been referred for surgical consultation.  Patient states that she was first noted to have a heart murmur on routine physical exam at least 20 years ago.  She has been followed by Dr. 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  TEE performed in December 2018 revealed normal left ventricular function with bileaflet prolapse and moderate mitral regurgitation.  Although the patient does not exercise on a regular basis, she has remained asymptomatic until recently.  In April of this year the patient began to experience frequent palpitations.  She describes episodes where her heart pounds for this long as 15 to 20 minutes.  She has developed exertional shortness of breath.  She notes that if she goes up a flight of stairs she is winded by the time she gets to the top.  She was seen in follow-up by Dr. [name] and had a stress Myoview exam was felt to be low risk with a slight anterior defect thought to be related to breast artifact.  Subsequent cardiac gated coronary CT angiogram revealed normal coronary arteries with coronary calcium score of 0.  Patient underwent Holter event monitor and was found to have episodes of nonsustained ventricular tachycardia.  Metoprolol was initiated but the patient began to experience worsening fatigue.  Patient was subsequently seen in follow-up by Dr. [name] and  underwent repeat transesophageal echocardiogram on July 27, 2018.  TEE confirmed the presence of bileaflet prolapse with severe mitral regurgitation.  Left ventricular systolic function remain normal.  Cardiothoracic surgical consultation was requested.  Patient is married and [place] her [place].  She works full-time for [name] in the [employer].  She does not exercise on a regular basis.  She walks once or twice a week, but she notes that her exercise tolerance has decreased.  She gets short of breath with moderate exertion such as going up a flight of stairs.  She denies resting shortness of breath, orthopnea, or PND.  She has occasional mild lower extremity edema.  She describes frequent palpitations.  She has not had dizzy spells or syncope.  She denies any exertional chest pain or chest tightness.  Past Medical History:  Diagnosis Date  . Chest pain    Left sided--sharp pain  . Fatigue    Extreme  . Mitral regurgitation    Mild  . Mitral valve prolapse   . Palpitations     Past Surgical History:  Procedure Laterality Date  . TEE WITHOUT CARDIOVERSION N/A 12/08/2017   Procedure: TRANSESOPHAGEAL ECHOCARDIOGRAM (TEE);  Surgeon: [name], MD;  Location: [facility];  Service: Cardiovascular;  Laterality: N/A;  . TEE WITHOUT CARDIOVERSION N/A 07/27/2018   Procedure: TRANSESOPHAGEAL ECHOCARDIOGRAM (TEE);  Surgeon: [name], MD;  Location: [facility];  Service: Cardiovascular;  Laterality: N/A;  . TONSILLECTOMY  Family History  Problem Relation Age of Onset  . Hypertension Mother   . Heart failure Mother   . Hypertension Father   . Skin cancer Father   . Heart attack Brother     Social History   Socioeconomic History  . Marital status: Married    Spouse name: Not on file  . Number of children: Not on file  . Years of education: Not on file  . Highest education level: Not on file  Occupational History  .  Not on file  Social Needs  . Financial resource strain: Not on file  . Food insecurity:    Worry: Not on file    Inability: Not on file  . Transportation needs:    Medical: Not on file    Non-medical: Not on file  Tobacco Use  . Smoking status: Never Smoker  . Smokeless tobacco: Never Used  Substance and Sexual Activity  . Alcohol use: Yes  . Drug use: Not on file  . Sexual activity: Not on file  Lifestyle  . Physical activity:    Days per week: Not on file    Minutes per session: Not on file  . Stress: Not on file  Relationships  . Social connections:    Talks on phone: Not on file    Gets together: Not on file    Attends religious service: Not on file    Active member of club or organization: Not on file    Attends meetings of clubs or organizations: Not on file    Relationship status: Not on file  . Intimate partner violence:    Fear of current or ex partner: Not on file    Emotionally abused: Not on file    Physically abused: Not on file    Forced sexual activity: Not on file  Other Topics Concern  . Not on file  Social History Narrative  . Not on file    Current Outpatient Medications  Medication Sig Dispense Refill  . Ascorbic Acid (VITAMIN C PO) Take 1 tablet by mouth daily at 3 pm.    . Calcium Carbonate-Vitamin D (CALCIUM-D PO) Take 1 tablet by mouth daily at 3 pm.    . metoprolol tartrate (LOPRESSOR) 25 MG tablet Take 1 tablet (25 mg total) by mouth 2 (two) times daily. 180 tablet 3  . Multiple Vitamin (MULTIVITAMIN WITH MINERALS) TABS tablet Take 1 tablet by mouth daily.     . polyethylene glycol (MIRALAX / GLYCOLAX) packet Take 17 g by mouth daily.      No current facility-administered medications for this visit.     No Known Allergies    Review of Systems:   General:  normal appetite, decreased energy, no weight gain, no weight loss, no fever  Cardiac:  no chest pain with exertion, no chest pain at rest, +SOB with exertion, no resting SOB, no  PND, no orthopnea, + palpitations, + arrhythmia, no atrial fibrillation, + LE edema, no dizzy spells, no syncope  Respiratory:  + exertional shortness of breath, no home oxygen, no productive cough, no dry cough, no bronchitis, no wheezing, no hemoptysis, no asthma, no pain with inspiration or cough, no sleep apnea, no CPAP at night  GI:   no difficulty swallowing, no reflux, no frequent heartburn, no hiatal hernia, no abdominal pain, no constipation, no diarrhea, no hematochezia, no hematemesis, no melena  GU:   no dysuria,  no frequency, no urinary tract infection, no hematuria, no kidney stones, no kidney disease  Vascular:  no pain suggestive of claudication, no pain in feet, no leg cramps, no varicose veins, no DVT, no non-healing foot ulcer  Neuro:   no stroke, no TIA's, no seizures, no headaches, no temporary blindness one eye,  no slurred speech, no peripheral neuropathy, no chronic pain, no instability of gait, no memory/cognitive dysfunction  Musculoskeletal: no arthritis, no joint swelling, no myalgias, no difficulty walking, normal mobility   Skin:   no rash, no itching, no skin infections, no pressure sores or ulcerations  Psych:   no anxiety, no depression, no nervousness, no unusual recent stress  Eyes:   no blurry vision, no floaters, no recent vision changes, does not wears glasses or contacts  ENT:   no hearing loss, no loose or painful teeth, no dentures, last saw dentist July 2019  Hematologic:  no easy bruising, no abnormal bleeding, no clotting disorder, no frequent epistaxis  Endocrine:  no diabetes, does not check CBG's at home     Physical Exam:   BP 110/78 (BP Location: Right Arm, Patient Position: Sitting, Cuff Size: Normal)   Pulse 61   Resp 16   Ht 5\' 8"  (1.727 m)   Wt 155 lb (70.3 kg)   SpO2 96% Comment: RA  BMI 23.57 kg/m   General:    well-appearing  HEENT:  Unremarkable   Neck:   no JVD, no bruits, no adenopathy   Chest:   clear to auscultation,  symmetrical breath sounds, no wheezes, no rhonchi   CV:   RRR, grade III/VI late systolic murmur   Abdomen:  soft, non-tender, no masses   Extremities:  warm, well-perfused, pulses diminished, no LE edema  Rectal/GU  Deferred  Neuro:   Grossly non-focal and symmetrical throughout  Skin:   Clean and dry, no rashes, no breakdown   Diagnostic Tests:  Transesophageal Echocardiography  Patient:    Kara Owens, Kara Owens MR #:       [id number] Study Date: 12/08/2017 Gender:     F Age:        46 Height:     170.2 cm Weight:     68.2 kg BSA:        1.8 m^2 Pt. Status: Room:   ATTENDING    [name], M.D.  PERFORMING   [name], M.D.  REFERRING    [name], M.D.  ADMITTING    [name], Peggyann Juba     [name], Jr  REFERRING    [name], Jr  SONOGRAPHER  Dance, Tiffany  cc:  ------------------------------------------------------------------- LV EF: 60% -   65%  ------------------------------------------------------------------- Indications:      Mitral regurgitation 424.0.  ------------------------------------------------------------------- History:   PMH:   Angina pectoris.  Mitral valve prolapse.  ------------------------------------------------------------------- Study Conclusions  - Left ventricle: Systolic function was normal. The estimated   ejection fraction was in the range of 60% to 65%. - Aortic valve: No evidence of vegetation. - Mitral valve: Moderate prolapse, involving the anterior leaflet   and the posterior leaflet. There was moderate regurgitation. - Left atrium: No evidence of thrombus in the atrial cavity or   appendage. - Tricuspid valve: No evidence of vegetation. - Pulmonic valve: No evidence of vegetation.  ------------------------------------------------------------------- Study data:   Study status:  Routine.  Consent:  The risks, benefits, and alternatives to the procedure were explained to the patient and informed consent was  obtained.  Procedure:  The patient reported no pain pre or post test. Initial setup. The patient was brought to the laboratory. Surface  ECG leads were monitored. Sedation. Conscious sedation was administered by cardiology staff. Transesophageal echocardiography. An adult multiplane transesophageal probe was inserted by the attending cardiologistwithout difficulty. Image quality was adequate. Intravenous contrast (agitated saline) was administered.  Study completion:  The patient tolerated the procedure well. There were no complications.  Administered medications:   Fentanyl, 50mcg, IV.  Midazolam, 5mg , IV.          Diagnostic transesophageal echocardiography.  2D and color Doppler.  Birthdate:  Patient birthdate: 21-Nov-1971.  Age:  Patient is 47 yr old.  Sex:  Gender: female.    BMI: 23.5 kg/m^2.  Blood pressure:     140/95  Patient status:  Outpatient.  Study date:  Study date: 12/08/2017. Study time: 02:08 PM.  Location:  [facility].  -------------------------------------------------------------------  ------------------------------------------------------------------- Left ventricle:  Systolic function was normal. The estimated ejection fraction was in the range of 60% to 65%.  ------------------------------------------------------------------- Aortic valve:   Structurally normal valve.   Cusp separation was normal.  No evidence of vegetation.  Doppler:  There was no regurgitation.  ------------------------------------------------------------------- Aorta:  The aorta was normal, not dilated, and non-diseased.  ------------------------------------------------------------------- Mitral valve:  There is no reversal of flow in the pulmonary veins Moderate prolapse, involving the anterior leaflet and the posterior leaflet.  Doppler:  There was moderate regurgitation.  ------------------------------------------------------------------- Left atrium:   No evidence of thrombus in  the atrial cavity or appendage.  ------------------------------------------------------------------- Pulmonic valve:    Structurally normal valve.   Cusp separation was normal.  No evidence of vegetation.  ------------------------------------------------------------------- Tricuspid valve:   Structurally normal valve.   Leaflet separation was normal.  No evidence of vegetation.  Doppler:  There was trivial regurgitation.  ------------------------------------------------------------------- Post procedure conclusions Ascending Aorta:  - The aorta was normal, not dilated, and non-diseased.  ------------------------------------------------------------------- Prepared and Electronically Authenticated by  [name], M.D. 2018-12-14T18:32:14   HOLTER CARDIAC EVENT MONITOR   NSR - episodes of sinus brady and sinus tachycardia  Occasional episodes of Nonsustained VT.  These episodes of NSVT decreased later in the monitoring perior as we increased her metoprolol       MYOCARDIAL PERFUSION STRESS TEST   Nuclear stress EF: 59%.  Blood pressure demonstrated a normal response to exercise.  There was no ST segment deviation noted during stress.  Defect 1: There is a small defect of mild severity present in the mid anteroseptal and apical anterior location.  This is a low risk study.  The left ventricular ejection fraction is normal (55-65%).   Cardiac CTA  MEDICATIONS: Sub lingual nitro. 4mg  and lopressor 5mg  IV  TECHNIQUE: The patient was scanned on a Siemens 867 slice scanner. Gantry rotation speed was 250 msecs. Collimation was 0.6 mm. A 100 kV prospective scan was triggered in the ascending thoracic aorta at 35-75% of the R-R interval. Average HR during the scan was 60 bpm. The 3D data set was interpreted on a dedicated work station using MPR, MIP and VRT modes. A total of 80cc of contrast was used.  FINDINGS: Non-cardiac: See separate report from  [facility].  Calcium Score: 0 Agatston units.  Coronary Arteries: Right dominant with no anomalies  LM: No plaque or stenosis.  LAD system: No plaque or stenosis.  Circumflex system: No plaque or stenosis.  RCA system: No plaque or stenosis.  IMPRESSION: 1. Coronary artery calcium score 0 Agatston units, suggesting low risk for future cardiac events.  2.  No significant coronary disease noted.  [employer]  By: Loralie Champagne M.D.   On: 06/05/2018 16:53    Transesophageal Echocardiography  Patient:    Kara Owens, Kara Owens MR #:       [id number] Study Date: 07/27/2018 Gender:     F Age:        46 Height:     172.7 cm Weight:     71.4 kg BSA:        1.86 m^2 Pt. Status: Room:   ATTENDING    [name], M.D.  PERFORMING   [name], M.D.  SONOGRAPHER  Darlina Sicilian, RDCS  ADMITTING    [name], Peggyann Juba     [name], Jr  REFERRING    [name], Jr  cc:  ------------------------------------------------------------------- LV EF: 60% -   65%  ------------------------------------------------------------------- Indications:      MVD [non-rheumatic] 424.0. Severe valvular disease  ------------------------------------------------------------------- History:   PMH:  Shortness of Breath.  Mitral valve prolapse.  ------------------------------------------------------------------- Study Conclusions  - Left ventricle: The cavity size was normal. Wall thickness was   normal. Systolic function was normal. The estimated ejection   fraction was in the range of 60% to 65%. - Aortic valve: No evidence of vegetation. There was trivial   regurgitation. - Mitral valve: Moderate prolapse, involving the anterior leaflet   and the posterior leaflet. There was severe regurgitation. - Left atrium: No evidence of thrombus in the atrial cavity or   appendage.  Impressions:  - Normal LV function   Moderate MVP  involving both the anterior and posterior leaflts.   3-D images were obtained of the MV .   Severe mitral regurgitation   Her mitral regurgitation has worsened since her previous TEE in   Dec. 2018.  ------------------------------------------------------------------- Study data:   Study status:  Routine.  Consent:  The risks, benefits, and alternatives to the procedure were explained to the patient and informed consent was obtained.  Procedure:  The patient reported no pain pre or post test. Initial setup. The patient was brought to the laboratory. Surface ECG leads were monitored. Sedation. Conscious sedation was administered by cardiology staff. Transesophageal echocardiography. Topical anesthesia was obtained using viscous lidocaine. A transesophageal probe was inserted by the attending cardiologist. Image quality was adequate.  Study completion:  The patient tolerated the procedure well. There were no complications.  Administered medications:   Fentanyl, 71mcg. Midazolam, 4mg .          Diagnostic transesophageal echocardiography.  2D and color Doppler.  Birthdate:  Patient birthdate: 1971/12/20.  Age:  Patient is 47 yr old.  Sex:  Gender: female.    BMI: 23.9 kg/m^2.  Blood pressure:     102/70  Patient status:  Inpatient.  Study date:  Study date: 07/27/2018. Study time: 08:05 AM.  Location:  [facility]oscopy.  -------------------------------------------------------------------  ------------------------------------------------------------------- Left ventricle:  The cavity size was normal. Wall thickness was normal. Systolic function was normal. The estimated ejection fraction was in the range of 60% to 65%.  ------------------------------------------------------------------- Aortic valve:   Structurally normal valve.   Cusp separation was normal.  No evidence of vegetation.  Doppler:  There was  trivial regurgitation.  ------------------------------------------------------------------- Aorta:  The aorta was normal, not dilated, and non-diseased.  ------------------------------------------------------------------- Mitral valve:  The mitral regurgitation has worsened since her previous TEE in Dec. 2018. There is reversal of flow in the pulmonary veins . Moderate prolapse, involving the anterior leaflet and the posterior leaflet.  Doppler:  There was severe regurgitation.  ------------------------------------------------------------------- Left atrium:   No evidence of thrombus in  the atrial cavity or appendage.  ------------------------------------------------------------------- Pulmonic valve:    The valve appears to be grossly normal. Doppler:  There was mild regurgitation.  ------------------------------------------------------------------- Pericardium:  There was no pericardial effusion.   ------------------------------------------------------------------- Post procedure conclusions Ascending Aorta:  - The aorta was normal, not dilated, and non-diseased.  ------------------------------------------------------------------- Prepared and Electronically Authenticated by  [name], M.D. 2019-08-02T16:00:03   Impression:  Patient has mitral valve prolapse with stage D severe symptomatic primary mitral regurgitation associated with frequent palpitations, episodes of nonsustained ventricular tachycardia on recent cardiac event monitor, and exertional shortness of breath and fatigue consistent with chronic diastolic congestive heart failure, New York Heart Association functional class II.  I have personally reviewed the patient's recent transesophageal echocardiogram and cardiac CT angiogram.  TEE reveals anatomical findings consistent with Barlow's type myxomatous degenerative disease with large redundant leaflets, bileaflet prolapse, elongation of the posterior  leaflet, and severe mitral regurgitation.  Left ventricular size and systolic function remain normal.  There does not appear to be large amount of calcification in the leaflets, mitral annulus, or subvalvular apparatus.  I agree the patient needs mitral valve repair.  Although the repair may be somewhat complex I feel there remains a high likelihood of successful repair with low associated operative risk.  Cardiac gated CT angiogram reveals normal coronaries.  Patient will likely be good candidate for minimally invasive approach for surgery.   Plan:  The patient and her husband were counseled at length regarding the indications, risks and potential benefits of mitral valve repair.  The rationale for elective surgery has been explained, including a comparison between surgery and continued medical therapy with close follow-up.  The likelihood of successful and durable valve repair has been discussed with particular reference to the findings of their recent echocardiogram.  Based upon these findings and previous experience, I have quoted them a greater than 90 percent likelihood of successful valve repair.  Alternative surgical approaches have been discussed including a comparison between conventional sternotomy and minimally-invasive techniques.  The relative risks and benefits of each have been reviewed as they pertain to the patient's specific circumstances, and all of their questions have been addressed.  Expectations for the patient's postoperative convalescence have been discussed.  The patient hopes to proceed with surgery in the near future.  We tentatively plan for surgery on September 19, 2018.  The patient will undergo CT angiography to evaluate the feasibility of peripheral cannulation for surgery and return to our office for follow-up prior to surgery on September 17, 2018.  All of the questions have been addressed.   I spent in excess of 90 minutes during the conduct of this office consultation  and >50% of this time involved direct face-to-face encounter with the patient for counseling and/or coordination of their care.    Valentina Gu. Roxy Manns, MD 08/13/2018 12:30 PM

## 2018-08-13 NOTE — Patient Instructions (Signed)
   Continue taking all current medications without change through the day before surgery.  Have nothing to eat or drink after midnight the night before surgery.  On the morning of surgery take only metoprolol with a sip of water.

## 2018-08-14 ENCOUNTER — Other Ambulatory Visit: Payer: Self-pay | Admitting: Thoracic Surgery (Cardiothoracic Vascular Surgery)

## 2018-08-14 ENCOUNTER — Other Ambulatory Visit: Payer: Self-pay | Admitting: *Deleted

## 2018-08-14 DIAGNOSIS — I340 Nonrheumatic mitral (valve) insufficiency: Secondary | ICD-10-CM

## 2018-08-14 DIAGNOSIS — I71019 Dissection of thoracic aorta, unspecified: Secondary | ICD-10-CM

## 2018-08-14 DIAGNOSIS — I7101 Dissection of thoracic aorta: Secondary | ICD-10-CM

## 2018-08-15 ENCOUNTER — Encounter: Payer: Self-pay | Admitting: *Deleted

## 2018-08-15 ENCOUNTER — Other Ambulatory Visit: Payer: Self-pay | Admitting: Thoracic Surgery (Cardiothoracic Vascular Surgery)

## 2018-08-15 ENCOUNTER — Other Ambulatory Visit: Payer: Self-pay | Admitting: *Deleted

## 2018-08-15 DIAGNOSIS — I71019 Dissection of thoracic aorta, unspecified: Secondary | ICD-10-CM

## 2018-08-15 DIAGNOSIS — I7101 Dissection of thoracic aorta: Secondary | ICD-10-CM

## 2018-08-21 ENCOUNTER — Other Ambulatory Visit: Payer: 59

## 2018-08-21 ENCOUNTER — Ambulatory Visit
Admission: RE | Admit: 2018-08-21 | Discharge: 2018-08-21 | Disposition: A | Discharge status: Home / Self Care | Payer: 59 | Source: Ambulatory Visit | Attending: Thoracic Surgery (Cardiothoracic Vascular Surgery) | Admitting: Thoracic Surgery (Cardiothoracic Vascular Surgery)

## 2018-08-21 DIAGNOSIS — Z01818 Encounter for other preprocedural examination: Secondary | ICD-10-CM | POA: Diagnosis not present

## 2018-08-21 DIAGNOSIS — K8020 Calculus of gallbladder without cholecystitis without obstruction: Secondary | ICD-10-CM | POA: Diagnosis not present

## 2018-08-21 DIAGNOSIS — I341 Nonrheumatic mitral (valve) prolapse: Secondary | ICD-10-CM | POA: Diagnosis not present

## 2018-08-21 DIAGNOSIS — I7101 Dissection of thoracic aorta: Secondary | ICD-10-CM

## 2018-08-21 DIAGNOSIS — I71019 Dissection of thoracic aorta, unspecified: Secondary | ICD-10-CM

## 2018-08-21 MED ORDER — IOPAMIDOL (ISOVUE-370) INJECTION 76%
75.0000 mL | Freq: Once | INTRAVENOUS | Status: AC | PRN
  Administered 2018-08-21: 75 mL via INTRAVENOUS

## 2018-09-14 NOTE — Pre-Procedure Instructions (Signed)
Kara Owens  09/14/2018      [id number] [address], [address] [place] [address] Phone: [phone] Fax: [phone]    Your procedure is scheduled on Wednesday September 25.  Report to [facility] Admitting at 5:30 A.M.  Call this number if you have problems the morning of surgery:  [phone]   Remember:  Do not eat or drink after midnight.    Take these medicines the morning of surgery with A SIP OF WATER:   Metoprolol (lopressor) Acetaminophen (tylenol) if needed  7 days prior to surgery STOP taking any Aspirin(unless otherwise instructed by your surgeon), Aleve, Naproxen, Ibuprofen, Motrin, Advil, Goody's, BC's, all herbal medications, fish oil, and all vitamins     Do not wear jewelry, make-up or nail polish.  Do not wear lotions, powders, or perfumes, or deodorant.  Do not shave 48 hours prior to surgery.  Men may shave face and neck.  Do not bring valuables to the hospital.  [facility] is not responsible for any belongings or valuables.  Contacts, dentures or bridgework may not be worn into surgery.  Leave your suitcase in the car.  After surgery it may be brought to your room.  For patients admitted to the hospital, discharge time will be determined by your treatment team.  Patients discharged the day of surgery will not be allowed to drive home.   Special instructions:    [facility]- Preparing For Surgery  Before surgery, you can play an important role. Because skin is not sterile, your skin needs to be as free of germs as possible. You can reduce the number of germs on your skin by washing with CHG (chlorahexidine gluconate) Soap before surgery.  CHG is an antiseptic cleaner which kills germs and bonds with the skin to continue killing germs even after washing.    Oral Hygiene is also important to reduce your risk of infection.  Remember -  BRUSH YOUR TEETH THE MORNING OF SURGERY WITH YOUR REGULAR TOOTHPASTE  Please do not use if you have an allergy to CHG or antibacterial soaps. If your skin becomes reddened/irritated stop using the CHG.  Do not shave (including legs and underarms) for at least 48 hours prior to first CHG shower. It is OK to shave your face.  Please follow these instructions carefully.   1. Shower the NIGHT BEFORE SURGERY and the MORNING OF SURGERY with CHG.   2. If you chose to wash your hair, wash your hair first as usual with your normal shampoo.  3. After you shampoo, rinse your hair and body thoroughly to remove the shampoo.  4. Use CHG as you would any other liquid soap. You can apply CHG directly to the skin and wash gently with a scrungie or a clean washcloth.   5. Apply the CHG Soap to your body ONLY FROM THE NECK DOWN.  Do not use on open wounds or open sores. Avoid contact with your eyes, ears, mouth and genitals (private parts). Wash Face and genitals (private parts)  with your normal soap.  6. Wash thoroughly, paying special attention to the area where your surgery will be performed.  7. Thoroughly rinse your body with warm water from the neck down.  8. DO NOT shower/wash with your normal soap after using and rinsing off the CHG Soap.  9. Pat yourself dry with a CLEAN  TOWEL.  10. Wear CLEAN PAJAMAS to bed the night before surgery, wear comfortable clothes the morning of surgery  11. Place CLEAN SHEETS on your bed the night of your first shower and DO NOT SLEEP WITH PETS.    Day of Surgery:  Do not apply any deodorants/lotions.  Please wear clean clothes to the hospital/surgery center.   Remember to brush your teeth WITH YOUR REGULAR TOOTHPASTE.    Please read over the following fact sheets that you were given. Coughing and Deep Breathing, MRSA Information and Surgical Site Infection Prevention

## 2018-09-17 ENCOUNTER — Encounter (HOSPITAL_COMMUNITY): Payer: Self-pay

## 2018-09-17 ENCOUNTER — Ambulatory Visit (HOSPITAL_COMMUNITY)
Admission: RE | Admit: 2018-09-17 | Discharge: 2018-09-17 | Disposition: A | Discharge status: Home / Self Care | Payer: 59 | Source: Ambulatory Visit | Attending: Thoracic Surgery (Cardiothoracic Vascular Surgery) | Admitting: Thoracic Surgery (Cardiothoracic Vascular Surgery)

## 2018-09-17 ENCOUNTER — Other Ambulatory Visit: Payer: Self-pay

## 2018-09-17 ENCOUNTER — Ambulatory Visit (HOSPITAL_BASED_OUTPATIENT_CLINIC_OR_DEPARTMENT_OTHER)
Admission: RE | Admit: 2018-09-17 | Discharge: 2018-09-17 | Disposition: A | Discharge status: Home / Self Care | Payer: 59 | Source: Ambulatory Visit | Attending: Thoracic Surgery (Cardiothoracic Vascular Surgery) | Admitting: Thoracic Surgery (Cardiothoracic Vascular Surgery)

## 2018-09-17 ENCOUNTER — Encounter (HOSPITAL_COMMUNITY)
Admission: RE | Admit: 2018-09-17 | Discharge: 2018-09-17 | Disposition: A | Discharge status: Home / Self Care | Payer: 59 | Source: Ambulatory Visit | Attending: Thoracic Surgery (Cardiothoracic Vascular Surgery) | Admitting: Thoracic Surgery (Cardiothoracic Vascular Surgery)

## 2018-09-17 ENCOUNTER — Ambulatory Visit: Payer: 59 | Admitting: Thoracic Surgery (Cardiothoracic Vascular Surgery)

## 2018-09-17 ENCOUNTER — Encounter: Payer: Self-pay | Admitting: Thoracic Surgery (Cardiothoracic Vascular Surgery)

## 2018-09-17 VITALS — BP 104/70 | HR 84 | Resp 20 | Ht 68.0 in | Wt 156.0 lb

## 2018-09-17 DIAGNOSIS — R001 Bradycardia, unspecified: Secondary | ICD-10-CM

## 2018-09-17 DIAGNOSIS — I472 Ventricular tachycardia: Secondary | ICD-10-CM | POA: Diagnosis not present

## 2018-09-17 DIAGNOSIS — R9389 Abnormal findings on diagnostic imaging of other specified body structures: Secondary | ICD-10-CM

## 2018-09-17 DIAGNOSIS — D62 Acute posthemorrhagic anemia: Secondary | ICD-10-CM | POA: Diagnosis not present

## 2018-09-17 DIAGNOSIS — I340 Nonrheumatic mitral (valve) insufficiency: Secondary | ICD-10-CM

## 2018-09-17 DIAGNOSIS — Z01818 Encounter for other preprocedural examination: Secondary | ICD-10-CM

## 2018-09-17 DIAGNOSIS — I341 Nonrheumatic mitral (valve) prolapse: Secondary | ICD-10-CM

## 2018-09-17 HISTORY — DX: Other complications of anesthesia, initial encounter: T88.59XA

## 2018-09-17 HISTORY — DX: Other specified postprocedural states: R11.2

## 2018-09-17 HISTORY — DX: Adverse effect of unspecified anesthetic, initial encounter: T41.45XA

## 2018-09-17 HISTORY — DX: Other specified postprocedural states: Z98.890

## 2018-09-17 LAB — PULMONARY FUNCTION TEST
DL/VA % PRED: 86 %
DL/VA: 4.55 ml/min/mmHg/L
DLCO UNC: 21.84 ml/min/mmHg
DLCO unc % pred: 73 %
FEF 25-75 POST: 2.4 L/s
FEF 25-75 Pre: 2.39 L/sec
FEF2575-%Change-Post: 0 %
FEF2575-%PRED-PRE: 79 %
FEF2575-%Pred-Post: 79 %
FEV1-%Change-Post: 0 %
FEV1-%PRED-POST: 82 %
FEV1-%PRED-PRE: 82 %
FEV1-PRE: 2.65 L
FEV1-Post: 2.64 L
FEV1FVC-%CHANGE-POST: 4 %
FEV1FVC-%Pred-Pre: 98 %
FEV6-%CHANGE-POST: -4 %
FEV6-%PRED-PRE: 85 %
FEV6-%Pred-Post: 81 %
FEV6-POST: 3.22 L
FEV6-PRE: 3.37 L
FEV6FVC-%PRED-POST: 103 %
FEV6FVC-%PRED-PRE: 103 %
FVC-%Change-Post: -4 %
FVC-%PRED-PRE: 83 %
FVC-%Pred-Post: 79 %
FVC-POST: 3.22 L
FVC-Pre: 3.37 L
POST FEV6/FVC RATIO: 100 %
PRE FEV1/FVC RATIO: 78 %
Post FEV1/FVC ratio: 82 %
Pre FEV6/FVC Ratio: 100 %
RV % pred: 97 %
RV: 1.92 L
TLC % PRED: 92 %
TLC: 5.24 L

## 2018-09-17 LAB — TYPE AND SCREEN
ABO/RH(D): A NEG
Antibody Screen: NEGATIVE

## 2018-09-17 LAB — URINALYSIS, ROUTINE W REFLEX MICROSCOPIC
Bilirubin Urine: NEGATIVE
Glucose, UA: NEGATIVE mg/dL
Ketones, ur: NEGATIVE mg/dL
LEUKOCYTES UA: NEGATIVE
NITRITE: NEGATIVE
PROTEIN: NEGATIVE mg/dL
Specific Gravity, Urine: 1.003 — ABNORMAL LOW (ref 1.005–1.030)
pH: 7 (ref 5.0–8.0)

## 2018-09-17 LAB — SURGICAL PCR SCREEN
MRSA, PCR: NEGATIVE
STAPHYLOCOCCUS AUREUS: NEGATIVE

## 2018-09-17 LAB — BLOOD GAS, ARTERIAL
ACID-BASE DEFICIT: 0.5 mmol/L (ref 0.0–2.0)
BICARBONATE: 23.8 mmol/L (ref 20.0–28.0)
Drawn by: 421801
O2 SAT: 98.1 %
PATIENT TEMPERATURE: 98.6
PH ART: 7.385 (ref 7.350–7.450)
pCO2 arterial: 40.7 mmHg (ref 32.0–48.0)
pO2, Arterial: 110 mmHg — ABNORMAL HIGH (ref 83.0–108.0)

## 2018-09-17 LAB — CBC
HCT: 41.3 % (ref 36.0–46.0)
HEMOGLOBIN: 13.2 g/dL (ref 12.0–15.0)
MCH: 30.5 pg (ref 26.0–34.0)
MCHC: 32 g/dL (ref 30.0–36.0)
MCV: 95.4 fL (ref 78.0–100.0)
Platelets: 190 10*3/uL (ref 150–400)
RBC: 4.33 MIL/uL (ref 3.87–5.11)
RDW: 13.2 % (ref 11.5–15.5)
WBC: 6.4 10*3/uL (ref 4.0–10.5)

## 2018-09-17 LAB — COMPREHENSIVE METABOLIC PANEL
ALT: 17 U/L (ref 0–44)
ANION GAP: 11 (ref 5–15)
AST: 19 U/L (ref 15–41)
Albumin: 4.2 g/dL (ref 3.5–5.0)
Alkaline Phosphatase: 62 U/L (ref 38–126)
BUN: 9 mg/dL (ref 6–20)
CO2: 21 mmol/L — AB (ref 22–32)
Calcium: 9.5 mg/dL (ref 8.9–10.3)
Chloride: 104 mmol/L (ref 98–111)
Creatinine, Ser: 0.66 mg/dL (ref 0.44–1.00)
GFR calc non Af Amer: >60 mL/min (ref >60)
Glucose, Bld: 92 mg/dL (ref 70–99)
POTASSIUM: 3.7 mmol/L (ref 3.5–5.1)
SODIUM: 136 mmol/L (ref 135–145)
TOTAL PROTEIN: 6.8 g/dL (ref 6.5–8.1)
Total Bilirubin: 0.9 mg/dL (ref 0.3–1.2)

## 2018-09-17 LAB — PROTIME-INR
INR: 0.98
PROTHROMBIN TIME: 12.9 s (ref 11.4–15.2)

## 2018-09-17 LAB — HEMOGLOBIN A1C
Hgb A1c MFr Bld: 5.5 % (ref 4.8–5.6)
MEAN PLASMA GLUCOSE: 111.15 mg/dL

## 2018-09-17 LAB — ABO/RH: ABO/RH(D): A NEG

## 2018-09-17 LAB — APTT: APTT: 31 s (ref 24–36)

## 2018-09-17 MED ORDER — ALBUTEROL SULFATE (2.5 MG/3ML) 0.083% IN NEBU
2.5000 mg | INHALATION_SOLUTION | Freq: Once | RESPIRATORY_TRACT | Status: AC
  Administered 2018-09-17: 2.5 mg via RESPIRATORY_TRACT

## 2018-09-17 NOTE — Progress Notes (Addendum)
PCP - Dr. [name] Cardiologist - Dr. Cathie Olden  Chest x-ray - 09/17/18 EKG - 09/17/18 Stress Test -05/02/18  ECHO - 07/27/18 Cardiac Cath -denies  PFT's-09/17/18 Dopplers-09/17/18  Sleep Study - denies  Anesthesia review: Yes  Patient denies shortness of breath, fever, cough and chest pain at PAT appointment   Patient verbalized understanding of instructions that were given to them at the PAT appointment. Patient was also instructed that they will need to review over the PAT instructions again at home before surgery.

## 2018-09-17 NOTE — Progress Notes (Signed)
Pre-op Cardiac Surgery  Carotid artery duplex has been completed. Bilateral internal carotid arteries are near-normal with only minimal wall thickening or plaque. Vertebral arteries are patent with antegrade flow.  Upper Extremity Right Left  Brachial Pressures 112-Triphasic 129-Triphasic  Radial Waveforms Triphasic Triphasic  Ulnar Waveforms Triphasic Triphasic  Palmar Arch (Allen's Test) Within normal limits Signal decreases >50% with radial compression, is unaffected with ulnar compression.   09/17/2018 10:39 AM Maudry Mayhew, MHA, RVT, RDCS, RDMS

## 2018-09-17 NOTE — Progress Notes (Addendum)
[address]       [place] [address]             [phone]     CARDIOTHORACIC SURGERY OFFICE NOTE  Referring Provider is [name], [name], MD PCP is [name], MD   HPI:  Patient is a 47 year old female who returns to the office today for follow-up of mitral valve prolapse with severe symptomatic primary mitral regurgitation with tentative plans to proceed with elective mitral valve repair later this week.  She was originally seen in consultation on August 13, 2018.  Since then she reports no new problems or complaints.   Current Outpatient Medications  Medication Sig Dispense Refill  . acetaminophen (TYLENOL) 500 MG tablet Take 1,000 mg by mouth every 6 (six) hours as needed (for pain).    Marland Kitchen ibuprofen (ADVIL,MOTRIN) 200 MG tablet Take 200 mg by mouth every 6 (six) hours as needed (for pain.).    Marland Kitchen metoprolol tartrate (LOPRESSOR) 25 MG tablet Take 1 tablet (25 mg total) by mouth 2 (two) times daily. 180 tablet 3  . Multiple Vitamins-Minerals (AIRBORNE) CHEW Chew 1 tablet by mouth 2 (two) times daily as needed ([place]).    Marland Kitchen polyethylene glycol (MIRALAX / GLYCOLAX) packet Take 17 g by mouth daily.      No current facility-administered medications for this visit.       Physical Exam:   BP 104/70   Pulse 84   Resp 20   Ht 5\' 8"  (1.727 m)   Wt 156 lb (70.8 kg)   SpO2 97% Comment: RA  BMI 23.72 kg/m   General:  Well-appearing  Chest:   Clear to auscultation  CV:   Regular rate and rhythm with systolic murmur  Incisions:  n/a  Abdomen:  Soft nontender  Extremities:  Warm and well-perfused  Diagnostic Tests:  CT ANGIOGRAPHY CHEST, ABDOMEN AND PELVIS  TECHNIQUE: Multidetector CT imaging through the chest, abdomen and pelvis was performed using the standard protocol during bolus administration of intravenous contrast. Multiplanar reconstructed images and MIPs were obtained and reviewed to evaluate the vascular  anatomy.  CONTRAST:  54mL ISOVUE-370 IOPAMIDOL (ISOVUE-370) INJECTION 76%  COMPARISON:  MRA chest and heart 07/02/2018; prior CT scan of the chest 06/04/2018  FINDINGS: CTA CHEST FINDINGS  Cardiovascular: Normal caliber aorta with conventional 3 vessel arch anatomy. No evidence of aneurysm or dissection. The main pulmonary artery is normal in caliber. While the heart is normal in size, there is left ventricular dilatation.  Mediastinum/Nodes: Unremarkable CT appearance of the thyroid gland. No suspicious mediastinal or hilar adenopathy. No soft tissue mediastinal mass. The thoracic esophagus is unremarkable.  Lungs/Pleura: Lungs are clear. No pleural effusion or pneumothorax.  Musculoskeletal: No acute fracture or aggressive appearing lytic or blastic osseous lesion.  Review of the MIP images confirms the above findings.  CTA ABDOMEN AND PELVIS FINDINGS  VASCULAR  Aorta: Normal caliber aorta without aneurysm, dissection, vasculitis or significant stenosis.  Celiac: Patent without evidence of aneurysm, dissection, vasculitis or significant stenosis.  SMA: Patent without evidence of aneurysm, dissection, vasculitis or significant stenosis.  Renals: Both main renal arteries are patent without evidence of aneurysm, dissection, vasculitis, fibromuscular dysplasia or significant stenosis. Tiny left accessory renal artery to the upper pole.  IMA: Patent without evidence of aneurysm, dissection, vasculitis or significant stenosis.  Inflow: Patent without evidence of aneurysm, dissection, vasculitis or significant stenosis.  Veins: No obvious venous abnormality within the limitations of this arterial phase  study.  Review of the MIP images confirms the above findings.  NON-VASCULAR  Hepatobiliary: Normal hepatic contour and morphology. No discrete hepatic lesions. Gas containing calcification within the gallbladder neck consistent with  cholelithiasis. No intra or extrahepatic biliary ductal dilatation.  Pancreas: Unremarkable. No pancreatic ductal dilatation or surrounding inflammatory changes.  Spleen: Normal in size without focal abnormality.  Adrenals/Urinary Tract: Adrenal glands are unremarkable. Kidneys are normal, without renal calculi, focal lesion, or hydronephrosis. Bladder is unremarkable.  Stomach/Bowel: Stomach is within normal limits. Appendix appears normal. No evidence of bowel wall thickening, distention, or inflammatory changes.  Lymphatic: No suspicious lymphadenopathy.  Reproductive: Fibroid uterus.  No adnexal masses.  Other: No abdominal wall hernia or abnormality. No abdominopelvic ascites.  Musculoskeletal: No acute fracture or aggressive appearing lytic or blastic osseous lesion.  Review of the MIP images confirms the above findings.  IMPRESSION: 1. Mild left ventricular dilatation. 2. No evidence of arterial aneurysm, dissection or other acute or significant abnormality. 3. Cholelithiasis. 4. Suspect uterine fibroids.  Signed,  Criselda Peaches, MD  [facility]  [facility]   Electronically Signed   By: Jacqulynn Cadet M.D.   On: 08/21/2018 16:52    Impression:  Patient has mitral valve prolapse with stage D severe symptomatic primary mitral regurgitation associated with frequent palpitations, episodes of nonsustained ventricular tachycardia on recent cardiac event monitor, and exertional shortness of breath and fatigue consistent with chronic diastolic congestive heart failure, New York Heart Association functional class II.  I have personally reviewed the patient's recent transesophageal echocardiogram, cardiac CT angiogram, and CT angiogram of the aorta an iliac vessels.  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  Left ventricular size and systolic function remain normal.  There does not appear to be large amount of calcification in the leaflets, mitral annulus, or subvalvular apparatus.  I agree the patient needs mitral valve repair.  Although the repair may be somewhat complex I feel there remains a high likelihood of successful repair with low associated operative risk.  Cardiac gated CT angiogram reveals normal coronaries.  Patient appears to be good candidate for minimally invasive approach for surgery.  She does have a mild pectus deformity but there is minimal encroachment into the mediastinum.  There appears to be plenty of space to facilitate adequate exposure of the heart and mitral valve.  There are no contraindications to peripheral arterial cannulation for surgery.   Plan:  The patient and her husband were again counseled regarding the indications, risks and potential benefits of mitral valve repair.  The rationale for elective surgery has been explained, including a comparison between surgery and continued medical therapy with close follow-up.  The likelihood of successful and durable valve repair has been discussed with particular reference to the findings of their recent echocardiogram.  Based upon these findings and previous experience, I have quoted them a greater than 90 percent likelihood of successful valve repair.  Alternative surgical approaches have been discussed including a comparison between conventional sternotomy and minimally-invasive techniques.  The relative risks and benefits of each have been reviewed as they pertain to the patient's specific circumstances, and all of their questions have been addressed.  Expectations for the patient's postoperative convalescence have been discussed.  The patient understands and accepts all potential risks of surgery including but not limited to risk of death,  stroke or other neurologic  complication, myocardial infarction, congestive heart failure, respiratory failure, renal failure, bleeding requiring transfusion and/or reexploration, arrhythmia, infection or other wound complications, pneumonia, pleural and/or pericardial effusion, pulmonary embolus, aortic dissection or other major vascular complication, or delayed complications related to valve repair or replacement including but not limited to structural valve deterioration and failure, thrombosis, embolization, endocarditis, or paravalvular leak.  Specific risks potentially related to the minimally-invasive approach were discussed at length, including but not limited to risk of conversion to full or partial sternotomy, aortic dissection or other major vascular complication, unilateral acute lung injury or pulmonary edema, phrenic nerve dysfunction or paralysis, rib fracture, chronic pain, lung hernia, or lymphocele. All of their questions have been answered.   I spent in excess of 15 minutes during the conduct of this office consultation and >50% of this time involved direct face-to-face encounter with the patient for counseling and/or coordination of their care.    Valentina Gu. Roxy Manns, MD 09/17/2018 2:03 PM

## 2018-09-17 NOTE — Patient Instructions (Signed)
   Continue taking all current medications without change through the day before surgery.  Have nothing to eat or drink after midnight the night before surgery.  On the morning of surgery take only metoprolol with a sip of water.

## 2018-09-17 NOTE — H&P (Signed)
[address]       [place] [address]             [phone]          CARDIOTHORACIC SURGERY HISTORY AND PHYSICAL EXAM  Referring Provider is [name], [name], MD PCP is [name], MD      Chief Complaint  Patient presents with  . Mitral Regurgitation    new patient consult, Cardiac CT 6/10, TEE 8/2    HPI:  Patient is a 47 year old female with known history of mitral valve prolapse and mitral regurgitation who has been referred for surgical consultation.  Patient states that she was first noted to have a heart murmur on routine physical exam at least 20 years ago.  She has been followed by Dr. 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  TEE performed in December 2018 revealed normal left ventricular function with bileaflet prolapse and moderate mitral regurgitation.  Although the patient does not exercise on a regular basis, she has remained asymptomatic until recently.  In April of this year the patient began to experience frequent palpitations.  She describes episodes where her heart pounds for this long as 15 to 20 minutes.  She has developed exertional shortness of breath.  She notes that if she goes up a flight of stairs she is winded by the time she gets to the top.  She was seen in follow-up by Dr. [name] and had a stress Myoview exam was felt to be low risk with a slight anterior defect thought to be related to breast artifact.  Subsequent cardiac gated coronary CT angiogram revealed normal coronary arteries with coronary calcium score of 0.  Patient underwent Holter event monitor and was found to have episodes of nonsustained ventricular tachycardia.  Metoprolol was initiated but the patient began to experience worsening fatigue.  Patient was subsequently seen in  follow-up by Dr. [name] and underwent repeat transesophageal echocardiogram on July 27, 2018.  TEE confirmed the presence of bileaflet prolapse with severe mitral regurgitation.  Left ventricular systolic function remain normal.  Cardiothoracic surgical consultation was requested.  Patient is married and [place].  She works full-time for [name] in the [employer].  She does not exercise on a regular basis.  She walks once or twice a week, but she notes that her exercise tolerance has decreased.  She gets short of breath with moderate exertion such as going up a flight of stairs.  She denies resting shortness of breath, orthopnea, or PND.  She has occasional mild lower extremity edema.  She describes frequent palpitations.  She has not had dizzy spells or syncope.  She denies any exertional chest pain or chest tightness.    Past Medical History:  Diagnosis Date  . Chest pain    Left sided--sharp pain  . Complication of anesthesia   . Fatigue    Extreme  . Mitral regurgitation    Mild  . Mitral valve prolapse   . Palpitations   . PONV (postoperative nausea and vomiting)     Past Surgical History:  Procedure Laterality Date  . EYE SURGERY Left    retinal repair   . TEE WITHOUT CARDIOVERSION N/A 12/08/2017   Procedure: TRANSESOPHAGEAL ECHOCARDIOGRAM (TEE);  Surgeon: Thayer Headings, MD;  Location: [facility] ENDOSCOPY;  Service: Cardiovascular;  Laterality: N/A;  . TEE WITHOUT CARDIOVERSION N/A 07/27/2018   Procedure: TRANSESOPHAGEAL ECHOCARDIOGRAM (TEE);  Surgeon: [name], MD;  Location: [facility] ENDOSCOPY;  Service: Cardiovascular;  Laterality: N/A;  . TONSILLECTOMY    . TONSILLECTOMY      Family History  Problem Relation Age of Onset  . Hypertension Mother   . Heart failure Mother   . Hypertension Father   . Skin cancer Father   . Heart attack Brother     Social History Social History   Tobacco Use  . Smoking status:  Never Smoker  . Smokeless tobacco: Never Used  Substance Use Topics  . Alcohol use: Never    Frequency: Never  . Drug use: Never    Prior to Admission medications   Medication Sig Start Date End Date Taking? Authorizing Provider  acetaminophen (TYLENOL) 500 MG tablet Take 1,000 mg by mouth every 6 (six) hours as needed (for pain).   Yes [provider]  ibuprofen (ADVIL,MOTRIN) 200 MG tablet Take 200 mg by mouth every 6 (six) hours as needed (for pain.).   Yes [provider]  metoprolol tartrate (LOPRESSOR) 25 MG tablet Take 1 tablet (25 mg total) by mouth 2 (two) times daily. 07/04/18  Yes [name], [name], MD  Multiple Vitamins-Minerals (AIRBORNE) CHEW Chew 1 tablet by mouth 2 (two) times daily as needed ([place]).   Yes [provider]  polyethylene glycol (MIRALAX / GLYCOLAX) packet Take 17 g by mouth daily.    Yes [provider]    No Known Allergies   Review of Systems:              General:                      normal appetite, decreased energy, no weight gain, no weight loss, no fever             Cardiac:                       no chest pain with exertion, no chest pain at rest, +SOB with exertion, no resting SOB, no PND, no orthopnea, + palpitations, + arrhythmia, no atrial fibrillation, + LE edema, no dizzy spells, no syncope             Respiratory:                 + exertional shortness of breath, no home oxygen, no productive cough, no dry cough, no bronchitis, no wheezing, no hemoptysis, no asthma, no pain with inspiration or cough, no sleep apnea, no CPAP at night             GI:                               no difficulty swallowing, no reflux, no frequent heartburn, no hiatal hernia, no abdominal pain, no constipation, no diarrhea, no hematochezia, no hematemesis, no melena             GU:                              no dysuria,  no frequency, no urinary tract infection, no hematuria, no kidney stones, no kidney disease              Vascular:  no pain suggestive of claudication, no pain in feet, no leg cramps, no varicose veins, no DVT, no non-healing foot ulcer             Neuro:                         no stroke, no TIA's, no seizures, no headaches, no temporary blindness one eye,  no slurred speech, no peripheral neuropathy, no chronic pain, no instability of gait, no memory/cognitive dysfunction             Musculoskeletal:         no arthritis, no joint swelling, no myalgias, no difficulty walking, normal mobility              Skin:                            no rash, no itching, no skin infections, no pressure sores or ulcerations             Psych:                         no anxiety, no depression, no nervousness, no unusual recent stress             Eyes:                           no blurry vision, no floaters, no recent vision changes, does not wears glasses or contacts             ENT:                            no hearing loss, no loose or painful teeth, no dentures, last saw dentist July 2019             Hematologic:               no easy bruising, no abnormal bleeding, no clotting disorder, no frequent epistaxis             Endocrine:                   no diabetes, does not check CBG's at home                           Physical Exam:              BP 110/78 (BP Location: Right Arm, Patient Position: Sitting, Cuff Size: Normal)   Pulse 61   Resp 16   Ht 5\' 8"  (1.727 m)   Wt 155 lb (70.3 kg)   SpO2 96% Comment: RA  BMI 23.57 kg/m              General:                        well-appearing             HEENT:                       Unremarkable              Neck:  no JVD, no bruits, no adenopathy              Chest:                          clear to auscultation, symmetrical breath sounds, no wheezes, no rhonchi              CV:                              RRR, grade III/VI late systolic murmur              Abdomen:                    soft, non-tender, no  masses              Extremities:                 warm, well-perfused, pulses diminished, no LE edema             Rectal/GU                   Deferred             Neuro:                         Grossly non-focal and symmetrical throughout             Skin:                            Clean and dry, no rashes, no breakdown   Diagnostic Tests:  Transesophageal Echocardiography  Patient: Kara Owens, Kara Owens MR #: [id number] Study Date: 12/08/2017 Gender: F Age: 65 Height: 170.2 cm Weight: 68.2 kg BSA: 1.8 m^2 Pt. Status: Room:  ATTENDING [name], M.D. PERFORMING [name], M.D. REFERRING [name], M.D. ADMITTING [name], Peggyann Juba [name], Jr REFERRING [name], Jr SONOGRAPHER Dance, Tiffany  cc:  ------------------------------------------------------------------- LV EF: 60% - 65%  ------------------------------------------------------------------- Indications: Mitral regurgitation 424.0.  ------------------------------------------------------------------- History: PMH: Angina pectoris. Mitral valve prolapse.  ------------------------------------------------------------------- Study Conclusions  - Left ventricle: Systolic function was normal. The estimated ejection fraction was in the range of 60% to 65%. - Aortic valve: No evidence of vegetation. - Mitral valve: Moderate prolapse, involving the anterior leaflet and the posterior leaflet. There was moderate regurgitation. - Left atrium: No evidence of thrombus in the atrial cavity or appendage. - Tricuspid valve: No evidence of vegetation. - Pulmonic valve: No evidence of vegetation.  ------------------------------------------------------------------- Study data: Study status: Routine. Consent: The risks, benefits, and alternatives to the procedure were explained to the patient and informed consent was obtained.  Procedure: The patient reported no pain pre or post test. Initial setup. The patient was brought to the laboratory. Surface ECG leads were monitored. Sedation. Conscious sedation was administered by cardiology staff. Transesophageal echocardiography. An adult multiplane transesophageal probe was inserted by the attending cardiologistwithout difficulty. Image quality was adequate. Intravenous contrast (agitated saline) was administered. Study completion: The patient tolerated the procedure well. There were no complications. Administered medications: Fentanyl, 71mcg, IV. Midazolam, 5mg , IV. Diagnostic transesophageal echocardiography. 2D and color Doppler. Birthdate: Patient birthdate: 1971-06-03. Age: Patient is 47 yr old. Sex: Gender: female. BMI: 23.5 kg/m^2. Blood pressure: 140/95 Patient status: Outpatient. Study date: Study date: 12/08/2017. Study time: 02:08 PM. Location: Endoscopy.  -------------------------------------------------------------------  -------------------------------------------------------------------  Left ventricle: Systolic function was normal. The estimated ejection fraction was in the range of 60% to 65%.  ------------------------------------------------------------------- Aortic valve: Structurally normal valve. Cusp separation was normal. No evidence of vegetation. Doppler: There was no regurgitation.  ------------------------------------------------------------------- Aorta: The aorta was normal, not dilated, and non-diseased.  ------------------------------------------------------------------- Mitral valve: There is no reversal of flow in the pulmonary veins Moderate prolapse, involving the anterior leaflet and the posterior leaflet. Doppler: There was moderate regurgitation.  ------------------------------------------------------------------- Left atrium: No evidence of thrombus in the atrial  cavity or appendage.  ------------------------------------------------------------------- Pulmonic valve: Structurally normal valve. Cusp separation was normal. No evidence of vegetation.  ------------------------------------------------------------------- Tricuspid valve: Structurally normal valve. Leaflet separation was normal. No evidence of vegetation. Doppler: There was trivial regurgitation.  ------------------------------------------------------------------- Post procedure conclusions Ascending Aorta:  - The aorta was normal, not dilated, and non-diseased.  ------------------------------------------------------------------- Prepared and Electronically Authenticated by  [name], M.D. 2018-12-14T18:32:14   HOLTER CARDIAC EVENT MONITOR   NSR - episodes of sinus brady and sinus tachycardia  Occasional episodes of Nonsustained VT.  These episodes of NSVT decreased later in the monitoring perior as we increased her metoprolol      MYOCARDIAL PERFUSION STRESS TEST   Nuclear stress EF: 59%.  Blood pressure demonstrated a normal response to exercise.  There was no ST segment deviation noted during stress.  Defect 1: There is a small defect of mild severity present in the mid anteroseptal and apical anterior location.  This is a low risk study.  The left ventricular ejection fraction is normal (55-65%).   Cardiac CTA  MEDICATIONS: Sub lingual nitro. 4mg  and lopressor 5mg  IV  TECHNIQUE: The patient was scanned on a Siemens 595 slice scanner. Gantry rotation speed was 250 msecs. Collimation was 0.6 mm. A 100 kV prospective scan was triggered in the ascending thoracic aorta at 35-75% of the R-R interval. Average HR during the scan was 60 bpm. The 3D data set was interpreted on a dedicated work station using MPR, MIP and VRT modes. A total of 80cc of contrast was used.  FINDINGS: Non-cardiac: See separate report from  [facility].  Calcium Score: 0 Agatston units.  Coronary Arteries: Right dominant with no anomalies  LM: No plaque or stenosis.  LAD system: No plaque or stenosis.  Circumflex system: No plaque or stenosis.  RCA system: No plaque or stenosis.  IMPRESSION: 1. Coronary artery calcium score 0 Agatston units, suggesting low risk for future cardiac events.  2. No significant coronary disease noted.  Dalton Mclean   Electronically Signed By: Loralie Champagne M.D. On: 06/05/2018 16:53    Transesophageal Echocardiography  Patient: Kara Owens, Kara Owens MR #: [id number] Study Date: 07/27/2018 Gender: F Age: 89 Height: 172.7 cm Weight: 71.4 kg BSA: 1.86 m^2 Pt. Status: Room:  ATTENDING [name], M.D. PERFORMING [name], M.D. SONOGRAPHER Darlina Sicilian, RDCS ADMITTING [name], Peggyann Juba [name], Jr REFERRING [name], Jr  cc:  ------------------------------------------------------------------- LV EF: 60% - 65%  ------------------------------------------------------------------- Indications: MVD [non-rheumatic] 424.0. Severe valvular disease  ------------------------------------------------------------------- History: PMH: Shortness of Breath. Mitral valve prolapse.  ------------------------------------------------------------------- Study Conclusions  - Left ventricle: The cavity size was normal. Wall thickness was normal. Systolic function was normal. The estimated ejection fraction was in the range of 60% to 65%. - Aortic valve: No evidence of vegetation. There was trivial regurgitation. - Mitral valve: Moderate prolapse, involving the anterior leaflet and the posterior leaflet. There was severe regurgitation. - Left atrium: No evidence of thrombus in the atrial cavity or appendage.  Impressions:  -  Normal LV function Moderate MVP  involving both the anterior and posterior leaflts. 3-D images were obtained of the MV . Severe mitral regurgitation Her mitral regurgitation has worsened since her previous TEE in Dec. 2018.  ------------------------------------------------------------------- Study data: Study status: Routine. Consent: The risks, benefits, and alternatives to the procedure were explained to the patient and informed consent was obtained. Procedure: The patient reported no pain pre or post test. Initial setup. The patient was brought to the laboratory. Surface ECG leads were monitored. Sedation. Conscious sedation was administered by cardiology staff. Transesophageal echocardiography. Topical anesthesia was obtained using viscous lidocaine. A transesophageal probe was inserted by the attending cardiologist. Image quality was adequate. Study completion: The patient tolerated the procedure well. There were no complications. Administered medications: Fentanyl, 17mcg. Midazolam, 4mg . Diagnostic transesophageal echocardiography. 2D and color Doppler. Birthdate: Patient birthdate: [birth year]. Age: Patient is 47 yr old. Sex: Gender: female. BMI: 23.9 kg/m^2. Blood pressure: 102/70 Patient status: Inpatient. Study date: Study date: 07/27/2018. Study time: 08:05 AM. Location: Endoscopy.  -------------------------------------------------------------------  ------------------------------------------------------------------- Left ventricle: The cavity size was normal. Wall thickness was normal. Systolic function was normal. The estimated ejection fraction was in the range of 60% to 65%.  ------------------------------------------------------------------- Aortic valve: Structurally normal valve. Cusp separation was normal. No evidence of vegetation. Doppler: There was  trivial regurgitation.  ------------------------------------------------------------------- Aorta: The aorta was normal, not dilated, and non-diseased.  ------------------------------------------------------------------- Mitral valve: The mitral regurgitation has worsened since her previous TEE in Dec. 2018. There is reversal of flow in the pulmonary veins . Moderate prolapse, involving the anterior leaflet and the posterior leaflet. Doppler: There was severe regurgitation.  ------------------------------------------------------------------- Left atrium: No evidence of thrombus in the atrial cavity or appendage.  ------------------------------------------------------------------- Pulmonic valve: The valve appears to be grossly normal. Doppler: There was mild regurgitation.  ------------------------------------------------------------------- Pericardium: There was no pericardial effusion.  ------------------------------------------------------------------- Post procedure conclusions Ascending Aorta:  - The aorta was normal, not dilated, and non-diseased.  ------------------------------------------------------------------- Prepared and Electronically Authenticated by  [name], M.D. 2019-08-02T16:00:03   CT ANGIOGRAPHY CHEST, ABDOMEN AND PELVIS  TECHNIQUE: Multidetector CT imaging through the chest, abdomen and pelvis was performed using the standard protocol during bolus administration of intravenous contrast. Multiplanar reconstructed images and MIPs were obtained and reviewed to evaluate the vascular anatomy.  CONTRAST: 18mL ISOVUE-370 IOPAMIDOL (ISOVUE-370) INJECTION 76%  COMPARISON: MRA chest and heart 07/02/2018; prior CT scan of the chest 06/04/2018  FINDINGS: CTA CHEST FINDINGS  Cardiovascular: Normal caliber aorta with conventional 3 vessel arch anatomy. No evidence of aneurysm or dissection. The main pulmonary artery is  normal in caliber. While the heart is normal in size, there is left ventricular dilatation.  Mediastinum/Nodes: Unremarkable CT appearance of the thyroid gland. No suspicious mediastinal or hilar adenopathy. No soft tissue mediastinal mass. The thoracic esophagus is unremarkable.  Lungs/Pleura: Lungs are clear. No pleural effusion or pneumothorax.  Musculoskeletal: No acute fracture or aggressive appearing lytic or blastic osseous lesion.  Review of the MIP images confirms the above findings.  CTA ABDOMEN AND PELVIS FINDINGS  VASCULAR  Aorta: Normal caliber aorta without aneurysm, dissection, vasculitis or significant stenosis.  Celiac: Patent without evidence of aneurysm, dissection, vasculitis or significant stenosis.  SMA: Patent without evidence of aneurysm, dissection, vasculitis or significant stenosis.  Renals: Both main renal arteries are patent without evidence of aneurysm, dissection, vasculitis, fibromuscular dysplasia or significant stenosis. Tiny left accessory renal artery to the upper pole.  IMA: Patent without evidence of aneurysm, dissection, vasculitis or significant stenosis.  Inflow: Patent  without evidence of aneurysm, dissection, vasculitis or significant stenosis.  Veins: No obvious venous abnormality within the limitations of this arterial phase study.  Review of the MIP images confirms the above findings.  NON-VASCULAR  Hepatobiliary: Normal hepatic contour and morphology. No discrete hepatic lesions. Gas containing calcification within the gallbladder neck consistent with cholelithiasis. No intra or extrahepatic biliary ductal dilatation.  Pancreas: Unremarkable. No pancreatic ductal dilatation or surrounding inflammatory changes.  Spleen: Normal in size without focal abnormality.  Adrenals/Urinary Tract: Adrenal glands are unremarkable. Kidneys are normal, without renal calculi, focal lesion, or  hydronephrosis. Bladder is unremarkable.  Stomach/Bowel: Stomach is within normal limits. Appendix appears normal. No evidence of bowel wall thickening, distention, or inflammatory changes.  Lymphatic: No suspicious lymphadenopathy.  Reproductive: Fibroid uterus. No adnexal masses.  Other: No abdominal wall hernia or abnormality. No abdominopelvic ascites.  Musculoskeletal: No acute fracture or aggressive appearing lytic or blastic osseous lesion.  Review of the MIP images confirms the above findings.  IMPRESSION: 1. Mild left ventricular dilatation. 2. No evidence of arterial aneurysm, dissection or other acute or significant abnormality. 3. Cholelithiasis. 4. Suspect uterine fibroids.  Signed,  Criselda Peaches, MD  [facility]  [facility]   Electronically Signed By: Jacqulynn Cadet M.D. On: 08/21/2018 16:52    Impression:  Patient has mitral valve prolapsewithstage D severe symptomatic primary mitral regurgitation associated with frequent palpitations, episodes of nonsustained ventricular tachycardia on recent cardiac event monitor, and exertional shortness of breath and fatigue consistent with chronic diastolic congestive heart failure, New York Heart Association functional class II. I have personally reviewed the patient's recent transesophageal echocardiogram, cardiac CT angiogram, and CT angiogram of the aorta an iliac vessels. TEE reveals anatomical findings consistent with Barlow's type myxomatous degenerative disease with large redundant leaflets,bileaflet prolapse, elongation of the posterior leaflet, and severe mitral regurgitation. Left ventricular size and systolic function remain normal. There does not appear to be large amount of calcification in the leaflets, mitral annulus, or subvalvular apparatus. I agree the patient needs mitral valve repair. Although the repair may be  somewhat complex I feel there remains a high likelihood of successful repair with low associated operative risk. Cardiac gated CT angiogram reveals normal coronaries. Patient appears to be good candidate for minimally invasive approach for surgery.  There are no contraindications to peripheral arterial cannulation for surgery.   Plan:  The patientand her husband wereagain counseled regarding the indications, risks and potential benefits of mitral valve repair. The rationale for elective surgery has been explained, including a comparison between surgery and continued medical therapy with close follow-up. The likelihood of successful and durable valve repair has been discussed with particular reference to the findings of their recent echocardiogram. Based upon these findings and previous experience, I have quoted them a greater than 90percent likelihood of successful valve repair. Alternative surgical approaches have been discussed including a comparison between conventional sternotomy and minimally-invasive techniques. The relative risks and benefits of each have been reviewed as they pertain to the patient's specific circumstances, and all of their questions have been addressed. Expectations for the patient's postoperative convalescence have been discussed. The patient understands and accepts all potential risks of surgery including but not limited to risk of death, stroke or other neurologic complication, myocardial infarction, congestive heart failure, respiratory failure, renal failure, bleeding requiring transfusion and/or reexploration, arrhythmia, infection or other wound complications, pneumonia, pleural and/or pericardial effusion, pulmonary embolus, aortic dissection or other major vascular complication, or delayed complications related to  valve repair or replacement including but not limited to structural valve deterioration and failure, thrombosis, embolization, endocarditis, or  paravalvular leak.  Specific risks potentially related to the minimally-invasive approach were discussed at length, including but not limited to risk of conversion to full or partial sternotomy, aortic dissection or other major vascular complication, unilateral acute lung injury or pulmonary edema, phrenic nerve dysfunction or paralysis, rib fracture, chronic pain, lung hernia, or lymphocele. All of their questions have been answered.     Valentina Gu. Roxy Manns, MD 09/17/2018 2:03 PM

## 2018-09-18 MED ORDER — MAGNESIUM SULFATE 50 % IJ SOLN
40.0000 meq | INTRAMUSCULAR | Status: DC
  Filled 2018-09-18: qty 9.85

## 2018-09-18 MED ORDER — KENNESTONE BLOOD CARDIOPLEGIA VIAL
13.0000 mL | Freq: Once | Status: DC
  Filled 2018-09-18: qty 13

## 2018-09-18 MED ORDER — SODIUM CHLORIDE 0.9 % IV SOLN
INTRAVENOUS | Status: DC
  Filled 2018-09-18: qty 30

## 2018-09-18 MED ORDER — EPINEPHRINE PF 1 MG/ML IJ SOLN
0.0000 ug/min | INTRAVENOUS | Status: DC
  Filled 2018-09-18: qty 4

## 2018-09-18 MED ORDER — MILRINONE LACTATE IN DEXTROSE 20-5 MG/100ML-% IV SOLN
0.3000 ug/kg/min | INTRAVENOUS | Status: DC
  Filled 2018-09-18: qty 100

## 2018-09-18 MED ORDER — VANCOMYCIN HCL 1000 MG IV SOLR
INTRAVENOUS | Status: DC
  Filled 2018-09-18: qty 1000

## 2018-09-18 MED ORDER — PHENYLEPHRINE HCL-NACL 20-0.9 MG/250ML-% IV SOLN
30.0000 ug/min | INTRAVENOUS | Status: AC
  Administered 2018-09-19: 20 ug/min via INTRAVENOUS
  Filled 2018-09-18: qty 250

## 2018-09-18 MED ORDER — TRANEXAMIC ACID (OHS) BOLUS VIA INFUSION
15.0000 mg/kg | INTRAVENOUS | Status: AC
  Administered 2018-09-19: 1062 mg via INTRAVENOUS
  Filled 2018-09-18: qty 1062

## 2018-09-18 MED ORDER — VANCOMYCIN HCL 10 G IV SOLR
1250.0000 mg | INTRAVENOUS | Status: AC
  Administered 2018-09-19: 1250 mg via INTRAVENOUS
  Filled 2018-09-18 (×2): qty 1250

## 2018-09-18 MED ORDER — DEXMEDETOMIDINE HCL IN NACL 400 MCG/100ML IV SOLN
0.1000 ug/kg/h | INTRAVENOUS | Status: AC
  Administered 2018-09-19: .5 ug/kg/h via INTRAVENOUS
  Filled 2018-09-18: qty 100

## 2018-09-18 MED ORDER — SODIUM CHLORIDE 0.9 % IV SOLN
INTRAVENOUS | Status: AC
  Administered 2018-09-19: 1 [IU]/h via INTRAVENOUS
  Filled 2018-09-18: qty 1

## 2018-09-18 MED ORDER — POTASSIUM CHLORIDE 2 MEQ/ML IV SOLN
80.0000 meq | INTRAVENOUS | Status: DC
  Filled 2018-09-18: qty 40

## 2018-09-18 MED ORDER — TRANEXAMIC ACID 1000 MG/10ML IV SOLN
1.5000 mg/kg/h | INTRAVENOUS | Status: AC
  Administered 2018-09-19: 1.5 mg/kg/h via INTRAVENOUS
  Filled 2018-09-18: qty 25

## 2018-09-18 MED ORDER — TRANEXAMIC ACID (OHS) PUMP PRIME SOLUTION
2.0000 mg/kg | INTRAVENOUS | Status: DC
  Filled 2018-09-18: qty 1.42

## 2018-09-18 MED ORDER — SODIUM CHLORIDE 0.9 % IV SOLN
1.5000 g | INTRAVENOUS | Status: AC
  Administered 2018-09-19: 1.5 g via INTRAVENOUS
  Filled 2018-09-18: qty 1.5

## 2018-09-18 MED ORDER — KENNESTONE BLOOD CARDIOPLEGIA (KBC) MANNITOL SYRINGE (20%, 32ML)
32.0000 mL | Freq: Once | INTRAVENOUS | Status: DC
  Filled 2018-09-18 (×2): qty 32

## 2018-09-18 MED ORDER — CHLORHEXIDINE GLUCONATE 0.12 % MT SOLN
15.0000 mL | Freq: Once | OROMUCOSAL | Status: AC
  Administered 2018-09-19: 15 mL via OROMUCOSAL
  Filled 2018-09-18: qty 15

## 2018-09-18 MED ORDER — GLUTARALDEHYDE 0.625% SOAKING SOLUTION
TOPICAL | Status: DC
  Filled 2018-09-18: qty 50

## 2018-09-18 MED ORDER — KENNESTONE BLOOD CARDIOPLEGIA (KBC) MANNITOL SYRINGE (20%, 32ML)
32.0000 mL | Freq: Once | INTRAVENOUS | Status: DC
  Filled 2018-09-18 (×3): qty 32

## 2018-09-18 MED ORDER — SODIUM CHLORIDE 0.9 % IV SOLN
750.0000 mg | INTRAVENOUS | Status: DC
  Filled 2018-09-18 (×2): qty 750

## 2018-09-18 MED ORDER — PLASMA-LYTE 148 IV SOLN
INTRAVENOUS | Status: AC
  Administered 2018-09-19: 500 mL
  Filled 2018-09-18: qty 2.5

## 2018-09-18 MED ORDER — METOPROLOL TARTRATE 12.5 MG HALF TABLET: 12.5000 mg | ORAL_TABLET | Freq: Once | ORAL | Status: DC

## 2018-09-18 MED ORDER — DOPAMINE-DEXTROSE 3.2-5 MG/ML-% IV SOLN
0.0000 ug/kg/min | INTRAVENOUS | Status: DC
  Filled 2018-09-18: qty 250

## 2018-09-18 MED ORDER — NITROGLYCERIN IN D5W 200-5 MCG/ML-% IV SOLN
2.0000 ug/min | INTRAVENOUS | Status: DC
  Filled 2018-09-18: qty 250

## 2018-09-18 NOTE — Progress Notes (Signed)
Anesthesia Chart Review:  Case:  [id number] Date/Time:  09/19/18 0715   Procedures:      MINIMALLY INVASIVE MITRAL VALVE REPAIR (MVR) (Right Chest) - GLUTARALDEHYDE     TRANSESOPHAGEAL ECHOCARDIOGRAM (TEE) (N/A )   Anesthesia type:  General   Pre-op diagnosis:  MR   Location:  [facility] OR ROOM 15 / [place] OR   Surgeon:  [name], MD      DISCUSSION: Patient is a 47 year old female scheduled for the above procedure.   History includes never smoker, post-operative N/v, MVP with severe MR, palpitations (NSVT 04/2018 event monitor, Rx: b-blocker), fatigue, chest pain (w/u: severe MR; no significant CAD with Ca score 0 05/2018 coronary CT).    If no acute changes then I anticipate that she can proceed as planned. She is for urine pregnancy test on the day of surgery.   VS: BP (!) 115/55   Pulse (!) 56   Temp 36.7 C   Resp 18   Ht 5\' 8"  (1.727 m)   Wt 71.1 kg   LMP 07/17/2018   SpO2 100%   BMI 23.84 kg/m   PROVIDERS: [name], MD is PCP [name], MD is cardiologist.   LABS: Labs reviewed: Acceptable for surgery. (all labs ordered are listed, but only abnormal results are displayed)  Labs Reviewed  BLOOD GAS, ARTERIAL - Abnormal; Notable for the following components:      Result Value   pO2, Arterial 110 (*)    All other components within normal limits  COMPREHENSIVE METABOLIC PANEL - Abnormal; Notable for the following components:   CO2 21 (*)    All other components within normal limits  URINALYSIS, ROUTINE W REFLEX MICROSCOPIC - Abnormal; Notable for the following components:   Color, Urine COLORLESS (*)    Specific Gravity, Urine 1.003 (*)    Hgb urine dipstick SMALL (*)    Bacteria, UA RARE (*)    All other components within normal limits  SURGICAL PCR SCREEN  APTT  CBC  HEMOGLOBIN A1C  PROTIME-INR  TYPE AND SCREEN  ABO/RH   PFTs 09/17/18: FVC 3.37 (83%), FEV1 2.65 (82%), DLCO unc 21.84 (73%).   IMAGES: CXR 09/17/18: IMPRESSION: 1. Pectus  deformity. 2.  No evidence for acute cardiopulmonary abnormality.  CTA chest/abd/pelvis 08/21/18: IMPRESSION: 1. Mild left ventricular dilatation. 2. No evidence of arterial aneurysm, dissection or other acute or significant abnormality. 3. Cholelithiasis. 4. Suspect uterine fibroids.   EKG: 09/17/18: SB at 55 bpm.   CV: TEE 07/27/18: Study Conclusions - Left ventricle: The cavity size was normal. Wall thickness was   normal. Systolic function was normal. The estimated ejection   fraction was in the range of 60% to 65%. - Aortic valve: No evidence of vegetation. There was trivial   regurgitation. - Mitral valve: Moderate prolapse, involving the anterior leaflet   and the posterior leaflet. There was severe regurgitation. - Left atrium: No evidence of thrombus in the atrial cavity or   appendage. Impressions: - Normal LV function   Moderate MVP involving both the anterior and posterior leaflts.   3-D images were obtained of the MV .   Severe mitral regurgitation   Her mitral regurgitation has worsened since her previous TEE in   Dec. 2018.  MR Cardiac 07/02/18: IMPRESSION: 1.  Mildly dilated LV with EF 66%. 2.  Normal RV size and systolic function. 3. Bileaflet mitral valve prolapse, posterior>anterior, with the appearance of severe, anteriorly-directed mitral regurgitation. Unfortunately, flow sequences to quantify MR  were not done. 4. No myocardial LGE, so no definitive evidence for prior MI, infiltrative disease, or myocarditis.  CT cardiac 06/04/18: IMPRESSION: 1. Coronary artery calcium score 0 Agatston units, suggesting low risk for future cardiac events. 2.  No significant coronary disease noted.  Event monitor 04/25/18-05/14/18:  NSR - episodes of sinus brady and sinus tachycardia  Occasional episodes of Nonsustained VT.  These episodes of NSVT decreased later in the monitoring perior as we increased her metoprolol  Carotid U/S 09/17/18: Final  Interpretation: Right Carotid: The extracranial vessels were near-normal with only minimal wall        thickening or plaque. Left Carotid: The extracranial vessels were near-normal with only minimal wall       thickening or plaque. Vertebrals: Bilateral vertebral arteries demonstrate antegrade flow. Subclavians: Normal flow hemodynamics were seen in bilateral subclavian       arteries.   Past Medical History:  Diagnosis Date  . Chest pain    Left sided--sharp pain  . Complication of anesthesia   . Fatigue    Extreme  . Mitral regurgitation    Mild  . Mitral valve prolapse   . Palpitations   . PONV (postoperative nausea and vomiting)     Past Surgical History:  Procedure Laterality Date  . EYE SURGERY Left    retinal repair   . TEE WITHOUT CARDIOVERSION N/A 12/08/2017   Procedure: TRANSESOPHAGEAL ECHOCARDIOGRAM (TEE);  Surgeon: [name], MD;  Location: [facility];  Service: Cardiovascular;  Laterality: N/A;  . TEE WITHOUT CARDIOVERSION N/A 07/27/2018   Procedure: TRANSESOPHAGEAL ECHOCARDIOGRAM (TEE);  Surgeon: [name], MD;  Location: [facility] ENDOSCOPY;  Service: Cardiovascular;  Laterality: N/A;  . TONSILLECTOMY    . TONSILLECTOMY      MEDICATIONS: . acetaminophen (TYLENOL) 500 MG tablet  . ibuprofen (ADVIL,MOTRIN) 200 MG tablet  . metoprolol tartrate (LOPRESSOR) 25 MG tablet  . Multiple Vitamins-Minerals (AIRBORNE) CHEW  . polyethylene glycol (MIRALAX / GLYCOLAX) packet   No current facility-administered medications for this encounter.    Derrill Memo ON 09/19/2018] cefUROXime (ZINACEF) 1.5 g in sodium chloride 0.9 % 100 mL IVPB  . [START ON 09/19/2018] cefUROXime (ZINACEF) 750 mg in sodium chloride 0.9 % 100 mL IVPB  . [START ON 09/19/2018] chlorhexidine (PERIDEX) 0.12 % solution 15 mL  . [START ON 09/19/2018] dexmedetomidine (PRECEDEX) 400 MCG/100ML (4 mcg/mL) infusion  . [START ON 09/19/2018] DOPamine (INTROPIN) 800 mg in dextrose 5 % 250  mL (3.2 mg/mL) infusion  . [START ON 09/19/2018] EPINEPHrine (ADRENALIN) 4 mg in dextrose 5 % 250 mL (0.016 mg/mL) infusion  . [START ON 09/19/2018] glutaraldehyde 0.625% cardiac soaking solution  . [START ON 09/19/2018] heparin 2,500 Units, papaverine 30 mg in electrolyte-148 (PLASMALYTE-148) 500 mL irrigation  . [START ON 09/19/2018] heparin 30,000 units/NS 1000 mL solution for CELLSAVER  . [START ON 09/19/2018] insulin regular (NOVOLIN R,HUMULIN R) 100 Units in sodium chloride 0.9 % 100 mL (1 Units/mL) infusion  . [START ON 09/19/2018] [facility] Blood Cardioplegia (KBC) lidocaine 2% Syringe (42mL)  . [START ON 09/19/2018] [facility] Blood Cardioplegia (KBC) lidocaine 2% Syringe (52mL)  . [START ON 09/19/2018] [facility] Blood Cardioplegia (KBC) mannitol 20% Syringe (54mL)  . [START ON 09/19/2018] [facility] Blood Cardioplegia (KBC) mannitol 20% Syringe (54mL)  . [START ON 09/19/2018] magnesium sulfate (IV Push/IM) injection 40 mEq  . metoprolol tartrate (LOPRESSOR) tablet 12.5 mg  . [START ON 09/19/2018] milrinone (PRIMACOR) 20 MG/100 ML (0.2 mg/mL) infusion  . [START ON 09/19/2018] nitroGLYCERIN 50 mg in dextrose  5 % 250 mL (0.2 mg/mL) infusion  . [START ON 09/19/2018] phenylephrine (NEOSYNEPHRINE) 20-0.9 MG/250ML-% infusion  . [START ON 09/19/2018] potassium chloride injection 80 mEq  . [START ON 09/19/2018] tranexamic acid (CYKLOKAPRON) 2,500 mg in sodium chloride 0.9 % 250 mL (10 mg/mL) infusion  . [START ON 09/19/2018] tranexamic acid (CYKLOKAPRON) bolus via infusion - over 30 minutes 1,062 mg  . [START ON 09/19/2018] tranexamic acid (CYKLOKAPRON) pump prime solution 142 mg  . [START ON 09/19/2018] vancomycin (VANCOCIN) 1,250 mg in sodium chloride 0.9 % 250 mL IVPB    [facility] Short Stay Center/Anesthesiology Phone [phone] 09/18/2018 12:37 PM

## 2018-09-19 ENCOUNTER — Inpatient Hospital Stay (HOSPITAL_COMMUNITY): Payer: 59 | Admitting: Anesthesiology

## 2018-09-19 ENCOUNTER — Inpatient Hospital Stay (HOSPITAL_COMMUNITY): Payer: 59

## 2018-09-19 ENCOUNTER — Encounter (HOSPITAL_COMMUNITY): Payer: Self-pay | Admitting: *Deleted

## 2018-09-19 ENCOUNTER — Inpatient Hospital Stay (HOSPITAL_COMMUNITY): Payer: 59 | Admitting: Vascular Surgery

## 2018-09-19 ENCOUNTER — Encounter (HOSPITAL_COMMUNITY)
Admission: RE | Disposition: A | Discharge status: Home / Self Care | Payer: Self-pay | Source: Home / Self Care | Attending: Thoracic Surgery (Cardiothoracic Vascular Surgery)

## 2018-09-19 ENCOUNTER — Inpatient Hospital Stay (HOSPITAL_COMMUNITY)
Admission: RE | Admit: 2018-09-19 | Discharge: 2018-09-24 | DRG: 220 | Disposition: A | Discharge status: Home / Self Care | Payer: 59 | Attending: Thoracic Surgery (Cardiothoracic Vascular Surgery) | Admitting: Thoracic Surgery (Cardiothoracic Vascular Surgery)

## 2018-09-19 ENCOUNTER — Other Ambulatory Visit: Payer: Self-pay

## 2018-09-19 DIAGNOSIS — Z808 Family history of malignant neoplasm of other organs or systems: Secondary | ICD-10-CM

## 2018-09-19 DIAGNOSIS — R0989 Other specified symptoms and signs involving the circulatory and respiratory systems: Secondary | ICD-10-CM | POA: Diagnosis not present

## 2018-09-19 DIAGNOSIS — Z8249 Family history of ischemic heart disease and other diseases of the circulatory system: Secondary | ICD-10-CM

## 2018-09-19 DIAGNOSIS — Z952 Presence of prosthetic heart valve: Secondary | ICD-10-CM | POA: Diagnosis not present

## 2018-09-19 DIAGNOSIS — D509 Iron deficiency anemia, unspecified: Secondary | ICD-10-CM | POA: Diagnosis present

## 2018-09-19 DIAGNOSIS — Z85828 Personal history of other malignant neoplasm of skin: Secondary | ICD-10-CM | POA: Diagnosis not present

## 2018-09-19 DIAGNOSIS — Z79899 Other long term (current) drug therapy: Secondary | ICD-10-CM

## 2018-09-19 DIAGNOSIS — I472 Ventricular tachycardia: Secondary | ICD-10-CM | POA: Diagnosis not present

## 2018-09-19 DIAGNOSIS — R112 Nausea with vomiting, unspecified: Secondary | ICD-10-CM | POA: Diagnosis not present

## 2018-09-19 DIAGNOSIS — I341 Nonrheumatic mitral (valve) prolapse: Secondary | ICD-10-CM | POA: Diagnosis not present

## 2018-09-19 DIAGNOSIS — D62 Acute posthemorrhagic anemia: Secondary | ICD-10-CM | POA: Diagnosis not present

## 2018-09-19 DIAGNOSIS — D6959 Other secondary thrombocytopenia: Secondary | ICD-10-CM | POA: Diagnosis not present

## 2018-09-19 DIAGNOSIS — J9811 Atelectasis: Secondary | ICD-10-CM

## 2018-09-19 DIAGNOSIS — I480 Paroxysmal atrial fibrillation: Secondary | ICD-10-CM | POA: Diagnosis not present

## 2018-09-19 DIAGNOSIS — J939 Pneumothorax, unspecified: Secondary | ICD-10-CM

## 2018-09-19 DIAGNOSIS — I081 Rheumatic disorders of both mitral and tricuspid valves: Secondary | ICD-10-CM | POA: Diagnosis not present

## 2018-09-19 DIAGNOSIS — I493 Ventricular premature depolarization: Secondary | ICD-10-CM | POA: Diagnosis not present

## 2018-09-19 DIAGNOSIS — I371 Nonrheumatic pulmonary valve insufficiency: Secondary | ICD-10-CM | POA: Diagnosis not present

## 2018-09-19 DIAGNOSIS — Z9089 Acquired absence of other organs: Secondary | ICD-10-CM | POA: Diagnosis not present

## 2018-09-19 DIAGNOSIS — J90 Pleural effusion, not elsewhere classified: Secondary | ICD-10-CM | POA: Diagnosis not present

## 2018-09-19 DIAGNOSIS — I340 Nonrheumatic mitral (valve) insufficiency: Secondary | ICD-10-CM | POA: Diagnosis not present

## 2018-09-19 DIAGNOSIS — Z9889 Other specified postprocedural states: Secondary | ICD-10-CM

## 2018-09-19 HISTORY — DX: Other specified postprocedural states: Z98.890

## 2018-09-19 HISTORY — PX: TEE WITHOUT CARDIOVERSION: SHX5443

## 2018-09-19 HISTORY — PX: MITRAL VALVE REPAIR: SHX2039

## 2018-09-19 LAB — POCT I-STAT, CHEM 8
BUN: 7 mg/dL (ref 6–20)
BUN: 6 mg/dL (ref 6–20)
BUN: 4 mg/dL — AB (ref 6–20)
BUN: 5 mg/dL — ABNORMAL LOW (ref 6–20)
BUN: 3 mg/dL — ABNORMAL LOW (ref 6–20)
BUN: 5 mg/dL — ABNORMAL LOW (ref 6–20)
CALCIUM ION: 1.24 mmol/L (ref 1.15–1.40)
CALCIUM ION: 0.96 mmol/L — AB (ref 1.15–1.40)
CALCIUM ION: 1.04 mmol/L — AB (ref 1.15–1.40)
CHLORIDE: 105 mmol/L (ref 98–111)
CREATININE: 0.4 mg/dL — AB (ref 0.44–1.00)
CREATININE: 0.4 mg/dL — AB (ref 0.44–1.00)
CREATININE: 0.3 mg/dL — AB (ref 0.44–1.00)
CREATININE: 0.3 mg/dL — AB (ref 0.44–1.00)
CREATININE: 0.4 mg/dL — AB (ref 0.44–1.00)
Calcium, Ion: 1.22 mmol/L (ref 1.15–1.40)
Calcium, Ion: 0.9 mmol/L — ABNORMAL LOW (ref 1.15–1.40)
Calcium, Ion: 1 mmol/L — ABNORMAL LOW (ref 1.15–1.40)
Chloride: 104 mmol/L (ref 98–111)
Chloride: 105 mmol/L (ref 98–111)
Chloride: 101 mmol/L (ref 98–111)
Chloride: 102 mmol/L (ref 98–111)
Chloride: 107 mmol/L (ref 98–111)
Creatinine, Ser: 0.3 mg/dL — ABNORMAL LOW (ref 0.44–1.00)
GLUCOSE: 156 mg/dL — AB (ref 70–99)
Glucose, Bld: 108 mg/dL — ABNORMAL HIGH (ref 70–99)
Glucose, Bld: 126 mg/dL — ABNORMAL HIGH (ref 70–99)
Glucose, Bld: 108 mg/dL — ABNORMAL HIGH (ref 70–99)
Glucose, Bld: 106 mg/dL — ABNORMAL HIGH (ref 70–99)
Glucose, Bld: 122 mg/dL — ABNORMAL HIGH (ref 70–99)
HCT: 35 % — ABNORMAL LOW (ref 36.0–46.0)
HCT: 32 % — ABNORMAL LOW (ref 36.0–46.0)
HCT: 26 % — ABNORMAL LOW (ref 36.0–46.0)
HCT: 26 % — ABNORMAL LOW (ref 36.0–46.0)
HEMATOCRIT: 29 % — AB (ref 36.0–46.0)
HEMATOCRIT: 26 % — AB (ref 36.0–46.0)
HEMOGLOBIN: 10.9 g/dL — AB (ref 12.0–15.0)
HEMOGLOBIN: 9.9 g/dL — AB (ref 12.0–15.0)
HEMOGLOBIN: 8.8 g/dL — AB (ref 12.0–15.0)
HEMOGLOBIN: 8.8 g/dL — AB (ref 12.0–15.0)
HEMOGLOBIN: 8.8 g/dL — AB (ref 12.0–15.0)
Hemoglobin: 11.9 g/dL — ABNORMAL LOW (ref 12.0–15.0)
POTASSIUM: 3.4 mmol/L — AB (ref 3.5–5.1)
POTASSIUM: 3.9 mmol/L (ref 3.5–5.1)
POTASSIUM: 3.6 mmol/L (ref 3.5–5.1)
Potassium: 3.7 mmol/L (ref 3.5–5.1)
Potassium: 3.5 mmol/L (ref 3.5–5.1)
Potassium: 4.4 mmol/L (ref 3.5–5.1)
Sodium: 138 mmol/L (ref 135–145)
Sodium: 136 mmol/L (ref 135–145)
Sodium: 139 mmol/L (ref 135–145)
Sodium: 133 mmol/L — ABNORMAL LOW (ref 135–145)
Sodium: 137 mmol/L (ref 135–145)
Sodium: 139 mmol/L (ref 135–145)
TCO2: 26 mmol/L (ref 22–32)
TCO2: 25 mmol/L (ref 22–32)
TCO2: 22 mmol/L (ref 22–32)
TCO2: 23 mmol/L (ref 22–32)
TCO2: 21 mmol/L — ABNORMAL LOW (ref 22–32)
TCO2: 24 mmol/L (ref 22–32)

## 2018-09-19 LAB — CBC
HEMATOCRIT: 32.6 % — AB (ref 36.0–46.0)
HEMATOCRIT: 29 % — AB (ref 36.0–46.0)
HEMOGLOBIN: 10.6 g/dL — AB (ref 12.0–15.0)
HEMOGLOBIN: 9.6 g/dL — AB (ref 12.0–15.0)
MCH: 31.3 pg (ref 26.0–34.0)
MCH: 31.4 pg (ref 26.0–34.0)
MCHC: 32.5 g/dL (ref 30.0–36.0)
MCHC: 33.1 g/dL (ref 30.0–36.0)
MCV: 96.2 fL (ref 78.0–100.0)
MCV: 94.8 fL (ref 78.0–100.0)
Platelets: 148 10*3/uL — ABNORMAL LOW (ref 150–400)
Platelets: 144 10*3/uL — ABNORMAL LOW (ref 150–400)
RBC: 3.39 MIL/uL — AB (ref 3.87–5.11)
RBC: 3.06 MIL/uL — AB (ref 3.87–5.11)
RDW: 13.2 % (ref 11.5–15.5)
RDW: 13.4 % (ref 11.5–15.5)
WBC: 24.4 10*3/uL — ABNORMAL HIGH (ref 4.0–10.5)
WBC: 21.6 10*3/uL — ABNORMAL HIGH (ref 4.0–10.5)

## 2018-09-19 LAB — PROTIME-INR
INR: 1.48
Prothrombin Time: 17.8 seconds — ABNORMAL HIGH (ref 11.4–15.2)

## 2018-09-19 LAB — POCT I-STAT 3, ART BLOOD GAS (G3+)
ACID-BASE DEFICIT: 2 mmol/L (ref 0.0–2.0)
ACID-BASE DEFICIT: 6 mmol/L — AB (ref 0.0–2.0)
ACID-BASE DEFICIT: 3 mmol/L — AB (ref 0.0–2.0)
Acid-base deficit: 4 mmol/L — ABNORMAL HIGH (ref 0.0–2.0)
BICARBONATE: 22.8 mmol/L (ref 20.0–28.0)
BICARBONATE: 22.8 mmol/L (ref 20.0–28.0)
Bicarbonate: 20.7 mmol/L (ref 20.0–28.0)
Bicarbonate: 21.5 mmol/L (ref 20.0–28.0)
O2 SAT: 100 %
O2 SAT: 99 %
O2 Saturation: 100 %
O2 Saturation: 98 %
PCO2 ART: 37.5 mmHg (ref 32.0–48.0)
PCO2 ART: 50.6 mmHg — AB (ref 32.0–48.0)
PH ART: 7.351 (ref 7.350–7.450)
PO2 ART: 417 mmHg — AB (ref 83.0–108.0)
TCO2: 24 mmol/L (ref 22–32)
TCO2: 22 mmol/L (ref 22–32)
TCO2: 23 mmol/L (ref 22–32)
TCO2: 24 mmol/L (ref 22–32)
pCO2 arterial: 38.2 mmHg (ref 32.0–48.0)
pCO2 arterial: 45.6 mmHg (ref 32.0–48.0)
pH, Arterial: 7.384 (ref 7.350–7.450)
pH, Arterial: 7.23 — ABNORMAL LOW (ref 7.350–7.450)
pH, Arterial: 7.308 — ABNORMAL LOW (ref 7.350–7.450)
pO2, Arterial: 372 mmHg — ABNORMAL HIGH (ref 83.0–108.0)
pO2, Arterial: 160 mmHg — ABNORMAL HIGH (ref 83.0–108.0)
pO2, Arterial: 118 mmHg — ABNORMAL HIGH (ref 83.0–108.0)

## 2018-09-19 LAB — GLUCOSE, CAPILLARY
GLUCOSE-CAPILLARY: 125 mg/dL — AB (ref 70–99)
GLUCOSE-CAPILLARY: 99 mg/dL (ref 70–99)
Glucose-Capillary: 114 mg/dL — ABNORMAL HIGH (ref 70–99)
Glucose-Capillary: 118 mg/dL — ABNORMAL HIGH (ref 70–99)
Glucose-Capillary: 115 mg/dL — ABNORMAL HIGH (ref 70–99)
Glucose-Capillary: 118 mg/dL — ABNORMAL HIGH (ref 70–99)
Glucose-Capillary: 121 mg/dL — ABNORMAL HIGH (ref 70–99)

## 2018-09-19 LAB — CREATININE, SERUM: CREATININE: 0.54 mg/dL (ref 0.44–1.00)

## 2018-09-19 LAB — PLATELET COUNT: PLATELETS: 147 10*3/uL — AB (ref 150–400)

## 2018-09-19 LAB — POCT I-STAT 4, (NA,K, GLUC, HGB,HCT)
GLUCOSE: 107 mg/dL — AB (ref 70–99)
HEMATOCRIT: 31 % — AB (ref 36.0–46.0)
HEMOGLOBIN: 10.5 g/dL — AB (ref 12.0–15.0)
POTASSIUM: 3.9 mmol/L (ref 3.5–5.1)
Sodium: 141 mmol/L (ref 135–145)

## 2018-09-19 LAB — APTT: aPTT: 36 seconds (ref 24–36)

## 2018-09-19 LAB — MAGNESIUM: MAGNESIUM: 3.3 mg/dL — AB (ref 1.7–2.4)

## 2018-09-19 LAB — HEMOGLOBIN AND HEMATOCRIT, BLOOD
HCT: 27.1 % — ABNORMAL LOW (ref 36.0–46.0)
Hemoglobin: 8.9 g/dL — ABNORMAL LOW (ref 12.0–15.0)

## 2018-09-19 LAB — POCT PREGNANCY, URINE: Preg Test, Ur: NEGATIVE

## 2018-09-19 SURGERY — REPAIR, MITRAL VALVE, MINIMALLY INVASIVE
Anesthesia: General | Site: Chest | Laterality: Right

## 2018-09-19 MED ORDER — VANCOMYCIN HCL 1000 MG IV SOLR
INTRAVENOUS | Status: DC | PRN
  Administered 2018-09-19: 1000 mL

## 2018-09-19 MED ORDER — ACETAMINOPHEN 500 MG PO TABS
1000.0000 mg | ORAL_TABLET | Freq: Four times a day (QID) | ORAL | Status: DC
  Administered 2018-09-19 – 2018-09-24 (×16): 1000 mg via ORAL
  Filled 2018-09-19 (×16): qty 2

## 2018-09-19 MED ORDER — ROCURONIUM BROMIDE 50 MG/5ML IV SOSY
PREFILLED_SYRINGE | INTRAVENOUS | Status: AC
  Filled 2018-09-19: qty 25

## 2018-09-19 MED ORDER — ORAL CARE MOUTH RINSE
15.0000 mL | Freq: Two times a day (BID) | OROMUCOSAL | Status: DC
  Administered 2018-09-19 – 2018-09-23 (×4): 15 mL via OROMUCOSAL

## 2018-09-19 MED ORDER — METOPROLOL TARTRATE 5 MG/5ML IV SOLN: 2.5000 mg | INTRAVENOUS | Status: DC | PRN

## 2018-09-19 MED ORDER — SUGAMMADEX SODIUM 500 MG/5ML IV SOLN
INTRAVENOUS | Status: DC | PRN
  Administered 2018-09-19: 200 mg via INTRAVENOUS
  Administered 2018-09-19: 300 mg via INTRAVENOUS

## 2018-09-19 MED ORDER — LACTATED RINGERS IV SOLN: INTRAVENOUS | Status: DC

## 2018-09-19 MED ORDER — DOCUSATE SODIUM 100 MG PO CAPS
200.0000 mg | ORAL_CAPSULE | Freq: Every day | ORAL | Status: DC
  Administered 2018-09-20 – 2018-09-24 (×3): 200 mg via ORAL
  Filled 2018-09-19 (×4): qty 2

## 2018-09-19 MED ORDER — SUGAMMADEX SODIUM 500 MG/5ML IV SOLN
INTRAVENOUS | Status: AC
  Filled 2018-09-19: qty 5

## 2018-09-19 MED ORDER — PANTOPRAZOLE SODIUM 40 MG PO TBEC
40.0000 mg | DELAYED_RELEASE_TABLET | Freq: Every day | ORAL | Status: DC
  Administered 2018-09-21 – 2018-09-24 (×3): 40 mg via ORAL
  Filled 2018-09-19 (×4): qty 1

## 2018-09-19 MED ORDER — NITROGLYCERIN IN D5W 200-5 MCG/ML-% IV SOLN: 0.0000 ug/min | INTRAVENOUS | Status: DC

## 2018-09-19 MED ORDER — MIDAZOLAM HCL 10 MG/2ML IJ SOLN
INTRAMUSCULAR | Status: AC
  Filled 2018-09-19: qty 2

## 2018-09-19 MED ORDER — PHENYLEPHRINE HCL-NACL 20-0.9 MG/250ML-% IV SOLN
0.0000 ug/min | INTRAVENOUS | Status: DC
  Administered 2018-09-19: 40 ug/min via INTRAVENOUS
  Filled 2018-09-19: qty 250

## 2018-09-19 MED ORDER — MORPHINE SULFATE (PF) 2 MG/ML IV SOLN
1.0000 mg | INTRAVENOUS | Status: DC | PRN
  Administered 2018-09-19 (×2): 2 mg via INTRAVENOUS
  Filled 2018-09-19: qty 1

## 2018-09-19 MED ORDER — ASPIRIN 81 MG PO CHEW: 324.0000 mg | CHEWABLE_TABLET | Freq: Every day | ORAL | Status: DC

## 2018-09-19 MED ORDER — METOPROLOL TARTRATE 25 MG/10 ML ORAL SUSPENSION: 12.5000 mg | Freq: Two times a day (BID) | ORAL | Status: DC

## 2018-09-19 MED ORDER — INSULIN REGULAR BOLUS VIA INFUSION
0.0000 [IU] | Freq: Three times a day (TID) | INTRAVENOUS | Status: DC
  Filled 2018-09-19: qty 10

## 2018-09-19 MED ORDER — LACTATED RINGERS IV SOLN
INTRAVENOUS | Status: DC | PRN
  Administered 2018-09-19 (×2): via INTRAVENOUS

## 2018-09-19 MED ORDER — SODIUM CHLORIDE 0.9% FLUSH: 3.0000 mL | INTRAVENOUS | Status: DC | PRN

## 2018-09-19 MED ORDER — DEXMEDETOMIDINE HCL IN NACL 200 MCG/50ML IV SOLN
0.0000 ug/kg/h | INTRAVENOUS | Status: DC
  Filled 2018-09-19: qty 50

## 2018-09-19 MED ORDER — CHLORHEXIDINE GLUCONATE 0.12 % MT SOLN
15.0000 mL | OROMUCOSAL | Status: AC
  Administered 2018-09-19: 15 mL via OROMUCOSAL

## 2018-09-19 MED ORDER — CHLORHEXIDINE GLUCONATE 4 % EX LIQD: 30.0000 mL | CUTANEOUS | Status: DC

## 2018-09-19 MED ORDER — ONDANSETRON HCL 4 MG/2ML IJ SOLN
INTRAMUSCULAR | Status: AC
  Filled 2018-09-19: qty 2

## 2018-09-19 MED ORDER — BUPIVACAINE 0.5 % ON-Q PUMP SINGLE CATH 400 ML
400.0000 mL | INJECTION | Status: DC
  Filled 2018-09-19: qty 400

## 2018-09-19 MED ORDER — ONDANSETRON HCL 4 MG/2ML IJ SOLN
INTRAMUSCULAR | Status: DC | PRN
  Administered 2018-09-19: 4 mg via INTRAVENOUS

## 2018-09-19 MED ORDER — TRAMADOL HCL 50 MG PO TABS
50.0000 mg | ORAL_TABLET | ORAL | Status: DC | PRN
  Administered 2018-09-21: 100 mg via ORAL
  Filled 2018-09-19: qty 2

## 2018-09-19 MED ORDER — ALBUMIN HUMAN 5 % IV SOLN
250.0000 mL | INTRAVENOUS | Status: AC | PRN
  Administered 2018-09-19: 12.5 g via INTRAVENOUS

## 2018-09-19 MED ORDER — PROTAMINE SULFATE 10 MG/ML IV SOLN
INTRAVENOUS | Status: DC | PRN
  Administered 2018-09-19 (×5): 50 mg via INTRAVENOUS

## 2018-09-19 MED ORDER — PROPOFOL 10 MG/ML IV BOLUS
INTRAVENOUS | Status: DC | PRN
  Administered 2018-09-19: 80 mg via INTRAVENOUS

## 2018-09-19 MED ORDER — LACTATED RINGERS IV SOLN: 500.0000 mL | Freq: Once | INTRAVENOUS | Status: DC | PRN

## 2018-09-19 MED ORDER — SODIUM CHLORIDE 0.9 % IV SOLN: 250.0000 mL | INTRAVENOUS | Status: DC

## 2018-09-19 MED ORDER — SODIUM CHLORIDE 0.9 % IV SOLN
INTRAVENOUS | Status: DC
  Filled 2018-09-19: qty 1

## 2018-09-19 MED ORDER — FENTANYL CITRATE (PF) 250 MCG/5ML IJ SOLN
INTRAMUSCULAR | Status: AC
  Filled 2018-09-19: qty 25

## 2018-09-19 MED ORDER — ACETAMINOPHEN 160 MG/5ML PO SOLN: 650.0000 mg | Freq: Once | ORAL | Status: AC

## 2018-09-19 MED ORDER — OXYCODONE HCL 5 MG PO TABS
5.0000 mg | ORAL_TABLET | ORAL | Status: DC | PRN
  Administered 2018-09-19 (×2): 5 mg via ORAL
  Administered 2018-09-20 – 2018-09-22 (×3): 10 mg via ORAL
  Filled 2018-09-19: qty 2
  Filled 2018-09-19: qty 1
  Filled 2018-09-19: qty 2
  Filled 2018-09-19: qty 1
  Filled 2018-09-19: qty 2

## 2018-09-19 MED ORDER — SODIUM CHLORIDE 0.9 % IV SOLN
1.5000 g | Freq: Two times a day (BID) | INTRAVENOUS | Status: AC
  Administered 2018-09-19 – 2018-09-21 (×4): 1.5 g via INTRAVENOUS
  Filled 2018-09-19 (×4): qty 1.5

## 2018-09-19 MED ORDER — SODIUM BICARBONATE 8.4 % IV SOLN
50.0000 meq | Freq: Once | INTRAVENOUS | Status: AC
  Administered 2018-09-19: 50 meq via INTRAVENOUS

## 2018-09-19 MED ORDER — ROCURONIUM BROMIDE 10 MG/ML (PF) SYRINGE
PREFILLED_SYRINGE | INTRAVENOUS | Status: DC | PRN
  Administered 2018-09-19 (×3): 50 mg via INTRAVENOUS

## 2018-09-19 MED ORDER — PROPOFOL 10 MG/ML IV BOLUS
INTRAVENOUS | Status: AC
  Filled 2018-09-19: qty 20

## 2018-09-19 MED ORDER — BUPIVACAINE HCL (PF) 0.5 % IJ SOLN
INTRAMUSCULAR | Status: DC | PRN
  Administered 2018-09-19: 10 mL

## 2018-09-19 MED ORDER — SODIUM CHLORIDE 0.45 % IV SOLN
INTRAVENOUS | Status: DC | PRN
  Administered 2018-09-19: 14:00:00 via INTRAVENOUS

## 2018-09-19 MED ORDER — ACETAMINOPHEN 160 MG/5ML PO SOLN: 1000.0000 mg | Freq: Four times a day (QID) | ORAL | Status: DC

## 2018-09-19 MED ORDER — SODIUM CHLORIDE 0.9 % IR SOLN
Status: DC | PRN
  Administered 2018-09-19: 4000 mL

## 2018-09-19 MED ORDER — VANCOMYCIN HCL IN DEXTROSE 1-5 GM/200ML-% IV SOLN
1000.0000 mg | Freq: Once | INTRAVENOUS | Status: AC
  Administered 2018-09-19: 1000 mg via INTRAVENOUS
  Filled 2018-09-19 (×3): qty 200

## 2018-09-19 MED ORDER — MORPHINE SULFATE (PF) 2 MG/ML IV SOLN
1.0000 mg | INTRAVENOUS | Status: DC | PRN
  Filled 2018-09-19: qty 1

## 2018-09-19 MED ORDER — LACTATED RINGERS IV SOLN
INTRAVENOUS | Status: DC | PRN
  Administered 2018-09-19: 08:00:00 via INTRAVENOUS

## 2018-09-19 MED ORDER — ACETAMINOPHEN 650 MG RE SUPP
650.0000 mg | Freq: Once | RECTAL | Status: AC
  Administered 2018-09-19: 650 mg via RECTAL

## 2018-09-19 MED ORDER — SODIUM CHLORIDE 0.9 % IV SOLN
INTRAVENOUS | Status: AC
  Administered 2018-09-19: 14:00:00 via INTRAVENOUS

## 2018-09-19 MED ORDER — ASPIRIN EC 325 MG PO TBEC: 325.0000 mg | DELAYED_RELEASE_TABLET | Freq: Every day | ORAL | Status: DC

## 2018-09-19 MED ORDER — FAMOTIDINE IN NACL 20-0.9 MG/50ML-% IV SOLN
20.0000 mg | Freq: Two times a day (BID) | INTRAVENOUS | Status: DC
  Administered 2018-09-19: 20 mg via INTRAVENOUS

## 2018-09-19 MED ORDER — 0.9 % SODIUM CHLORIDE (POUR BTL) OPTIME
TOPICAL | Status: DC | PRN
  Administered 2018-09-19: 500 mL

## 2018-09-19 MED ORDER — METOPROLOL TARTRATE 12.5 MG HALF TABLET: 12.5000 mg | ORAL_TABLET | Freq: Two times a day (BID) | ORAL | Status: DC

## 2018-09-19 MED ORDER — HEPARIN SODIUM (PORCINE) 1000 UNIT/ML IJ SOLN
INTRAMUSCULAR | Status: DC | PRN
  Administered 2018-09-19: 25000 [IU] via INTRAVENOUS

## 2018-09-19 MED ORDER — BUPIVACAINE HCL (PF) 0.5 % IJ SOLN
INTRAMUSCULAR | Status: AC
  Filled 2018-09-19: qty 10

## 2018-09-19 MED ORDER — MAGNESIUM SULFATE 4 GM/100ML IV SOLN
4.0000 g | Freq: Once | INTRAVENOUS | Status: AC
  Administered 2018-09-19: 4 g via INTRAVENOUS
  Filled 2018-09-19: qty 100

## 2018-09-19 MED ORDER — BISACODYL 5 MG PO TBEC
10.0000 mg | DELAYED_RELEASE_TABLET | Freq: Every day | ORAL | Status: DC
  Administered 2018-09-20 – 2018-09-23 (×2): 10 mg via ORAL
  Filled 2018-09-19 (×3): qty 2

## 2018-09-19 MED ORDER — MIDAZOLAM HCL 5 MG/5ML IJ SOLN
INTRAMUSCULAR | Status: DC | PRN
  Administered 2018-09-19 (×2): 2 mg via INTRAVENOUS

## 2018-09-19 MED ORDER — HEPARIN SODIUM (PORCINE) 1000 UNIT/ML IJ SOLN
INTRAMUSCULAR | Status: AC
  Filled 2018-09-19: qty 1

## 2018-09-19 MED ORDER — ONDANSETRON HCL 4 MG/2ML IJ SOLN
4.0000 mg | Freq: Four times a day (QID) | INTRAMUSCULAR | Status: DC | PRN
  Administered 2018-09-19 – 2018-09-23 (×5): 4 mg via INTRAVENOUS
  Filled 2018-09-19 (×6): qty 2

## 2018-09-19 MED ORDER — SODIUM CHLORIDE 0.9% FLUSH
3.0000 mL | Freq: Two times a day (BID) | INTRAVENOUS | Status: DC
  Administered 2018-09-20 – 2018-09-23 (×6): 3 mL via INTRAVENOUS

## 2018-09-19 MED ORDER — SODIUM CHLORIDE 0.9 % IV SOLN: INTRAVENOUS | Status: DC

## 2018-09-19 MED ORDER — PHENYLEPHRINE 40 MCG/ML (10ML) SYRINGE FOR IV PUSH (FOR BLOOD PRESSURE SUPPORT)
PREFILLED_SYRINGE | INTRAVENOUS | Status: DC | PRN
  Administered 2018-09-19: 120 ug via INTRAVENOUS
  Administered 2018-09-19: 80 ug via INTRAVENOUS

## 2018-09-19 MED ORDER — DEXAMETHASONE SODIUM PHOSPHATE 10 MG/ML IJ SOLN
INTRAMUSCULAR | Status: AC
  Filled 2018-09-19: qty 1

## 2018-09-19 MED ORDER — METOCLOPRAMIDE HCL 5 MG/ML IJ SOLN
10.0000 mg | Freq: Four times a day (QID) | INTRAMUSCULAR | Status: AC
  Administered 2018-09-19 – 2018-09-20 (×3): 10 mg via INTRAVENOUS
  Filled 2018-09-19 (×3): qty 2

## 2018-09-19 MED ORDER — MIDAZOLAM HCL 2 MG/2ML IJ SOLN: 2.0000 mg | INTRAMUSCULAR | Status: DC | PRN

## 2018-09-19 MED ORDER — ALBUMIN HUMAN 5 % IV SOLN
INTRAVENOUS | Status: DC | PRN
  Administered 2018-09-19 (×2): via INTRAVENOUS

## 2018-09-19 MED ORDER — BUPIVACAINE 0.5 % ON-Q PUMP SINGLE CATH 400 ML
INJECTION | Status: AC | PRN
  Administered 2018-09-19: 400 mL

## 2018-09-19 MED ORDER — PROTAMINE SULFATE 10 MG/ML IV SOLN
INTRAVENOUS | Status: AC
  Filled 2018-09-19: qty 25

## 2018-09-19 MED ORDER — LACTATED RINGERS IV SOLN
INTRAVENOUS | Status: DC
  Administered 2018-09-19: 18:00:00 via INTRAVENOUS

## 2018-09-19 MED ORDER — DEXAMETHASONE SODIUM PHOSPHATE 10 MG/ML IJ SOLN
INTRAMUSCULAR | Status: DC | PRN
  Administered 2018-09-19: 10 mg via INTRAVENOUS

## 2018-09-19 MED ORDER — SODIUM CHLORIDE 0.9 % IV SOLN
750.0000 mg | INTRAVENOUS | Status: AC
  Administered 2018-09-19: 750 mg via INTRAVENOUS
  Filled 2018-09-19: qty 750

## 2018-09-19 MED ORDER — BISACODYL 10 MG RE SUPP: 10.0000 mg | Freq: Every day | RECTAL | Status: DC

## 2018-09-19 MED ORDER — FENTANYL CITRATE (PF) 250 MCG/5ML IJ SOLN
INTRAMUSCULAR | Status: DC | PRN
  Administered 2018-09-19 (×3): 100 ug via INTRAVENOUS
  Administered 2018-09-19: 50 ug via INTRAVENOUS
  Administered 2018-09-19: 100 ug via INTRAVENOUS
  Administered 2018-09-19: 50 ug via INTRAVENOUS

## 2018-09-19 MED ORDER — POTASSIUM CHLORIDE 10 MEQ/50ML IV SOLN
10.0000 meq | INTRAVENOUS | Status: AC
  Administered 2018-09-19 (×2): 10 meq via INTRAVENOUS

## 2018-09-19 SURGICAL SUPPLY — 92 items
ADAPTER CARDIO PERF ANTE/RETRO (ADAPTER) ×3 IMPLANT
BAG DECANTER FOR FLEXI CONT (MISCELLANEOUS) ×6 IMPLANT
BLADE SURG 11 STRL SS (BLADE) ×3 IMPLANT
CANISTER SUCT 3000ML PPV (MISCELLANEOUS) ×6 IMPLANT
CANNULA FEM VENOUS REMOTE 22FR (CANNULA) ×3 IMPLANT
CANNULA FEMORAL ART 14 SM (MISCELLANEOUS) ×3 IMPLANT
CANNULA GUNDRY RCSP 15FR (MISCELLANEOUS) ×3 IMPLANT
CANNULA OPTISITE PERFUSION 16F (CANNULA) IMPLANT
CANNULA OPTISITE PERFUSION 18F (CANNULA) ×3 IMPLANT
CANNULA SUMP PERICARDIAL (CANNULA) ×6 IMPLANT
CATH KIT ON Q 5IN SLV (PAIN MANAGEMENT) IMPLANT
CATH KIT ON-Q SILVERSOAK 5IN (CATHETERS) ×3 IMPLANT
CELLS DAT CNTRL 66122 CELL SVR (MISCELLANEOUS) ×2 IMPLANT
CONN ST 1/4X3/8  BEN (MISCELLANEOUS) ×2
CONN ST 1/4X3/8 BEN (MISCELLANEOUS) ×4 IMPLANT
CONNECTOR 1/2X3/8X1/2 3 WAY (MISCELLANEOUS) ×1
CONNECTOR 1/2X3/8X1/2 3WAY (MISCELLANEOUS) ×2 IMPLANT
CONT SPEC 4OZ CLIKSEAL STRL BL (MISCELLANEOUS) ×3 IMPLANT
COVER BACK TABLE 24X17X13 BIG (DRAPES) ×3 IMPLANT
CRADLE DONUT ADULT HEAD (MISCELLANEOUS) ×3 IMPLANT
DERMABOND ADVANCED (GAUZE/BANDAGES/DRESSINGS) ×2
DERMABOND ADVANCED .7 DNX12 (GAUZE/BANDAGES/DRESSINGS) ×4 IMPLANT
DEVICE PMI PUNCTURE CLOSURE (MISCELLANEOUS) ×3 IMPLANT
DEVICE SUT CK QUICK LOAD MINI (Prosthesis & Implant Heart) ×36 IMPLANT
DEVICE TROCAR PUNCTURE CLOSURE (ENDOMECHANICALS) ×3 IMPLANT
DRAIN CHANNEL 28F RND 3/8 FF (WOUND CARE) ×6 IMPLANT
DRAPE BILATERAL SPLIT (DRAPES) ×3 IMPLANT
DRAPE C-ARM 42X72 X-RAY (DRAPES) ×3 IMPLANT
DRAPE CV SPLIT W-CLR ANES SCRN (DRAPES) ×3 IMPLANT
DRAPE INCISE IOBAN 66X45 STRL (DRAPES) ×9 IMPLANT
DRAPE SLUSH/WARMER DISC (DRAPES) ×3 IMPLANT
DRSG AQUACEL AG ADV 3.5X 6 (GAUZE/BANDAGES/DRESSINGS) ×3 IMPLANT
DRSG COVADERM 4X8 (GAUZE/BANDAGES/DRESSINGS) ×3 IMPLANT
ELECT BLADE 6.5 EXT (BLADE) ×3 IMPLANT
ELECT REM PT RETURN 9FT ADLT (ELECTROSURGICAL) ×6
ELECTRODE REM PT RTRN 9FT ADLT (ELECTROSURGICAL) ×4 IMPLANT
FELT TEFLON 1X6 (MISCELLANEOUS) ×6 IMPLANT
FEMORAL VENOUS CANN RAP (CANNULA) IMPLANT
GAUZE SPONGE 4X4 12PLY STRL (GAUZE/BANDAGES/DRESSINGS) ×3 IMPLANT
GAUZE SPONGE 4X4 12PLY STRL LF (GAUZE/BANDAGES/DRESSINGS) ×3 IMPLANT
GLOVE ORTHO TXT STRL SZ7.5 (GLOVE) ×9 IMPLANT
GOWN STRL REUS W/ TWL LRG LVL3 (GOWN DISPOSABLE) ×8 IMPLANT
GOWN STRL REUS W/TWL LRG LVL3 (GOWN DISPOSABLE) ×4
KIT BASIN OR (CUSTOM PROCEDURE TRAY) ×3 IMPLANT
KIT DILATOR VASC 18G NDL (KITS) ×3 IMPLANT
KIT DRAINAGE VACCUM ASSIST (KITS) ×3 IMPLANT
KIT SUCTION CATH 14FR (SUCTIONS) ×3 IMPLANT
KIT SUT CK MINI COMBO 4X17 (Prosthesis & Implant Heart) ×18 IMPLANT
KIT TURNOVER KIT B (KITS) ×3 IMPLANT
LEAD PACING MYOCARDI (MISCELLANEOUS) ×3 IMPLANT
LINE VENT (MISCELLANEOUS) ×3 IMPLANT
NEEDLE AORTIC ROOT 14G 7F (CATHETERS) ×3 IMPLANT
NS IRRIG 1000ML POUR BTL (IV SOLUTION) ×15 IMPLANT
PACK OPEN HEART (CUSTOM PROCEDURE TRAY) ×3 IMPLANT
PAD ARMBOARD 7.5X6 YLW CONV (MISCELLANEOUS) ×6 IMPLANT
PAD ELECT DEFIB RADIOL ZOLL (MISCELLANEOUS) ×3 IMPLANT
RING MITRAL MEMO 4D 32 (Prosthesis & Implant Heart) ×3 IMPLANT
RTRCTR WOUND ALEXIS 18CM MED (MISCELLANEOUS) ×3
SET CANNULATION TOURNIQUET (MISCELLANEOUS) ×3 IMPLANT
SET CARDIOPLEGIA MPS 5001102 (MISCELLANEOUS) ×3 IMPLANT
SET IRRIG TUBING LAPAROSCOPIC (IRRIGATION / IRRIGATOR) ×3 IMPLANT
SOLUTION ANTI FOG 6CC (MISCELLANEOUS) ×3 IMPLANT
SUT BONE WAX W31G (SUTURE) ×3 IMPLANT
SUT E-PACK MINIMALLY INVASIVE (SUTURE) ×3 IMPLANT
SUT ETHIBOND 2 0 SH (SUTURE) ×3 IMPLANT
SUT ETHIBOND 2-0 RB-1 WHT (SUTURE) ×12 IMPLANT
SUT ETHIBOND X763 2 0 SH 1 (SUTURE) ×3 IMPLANT
SUT GORETEX CV 4 TH 22 36 (SUTURE) ×3 IMPLANT
SUT GORETEX CV4 TH-18 (SUTURE) ×6 IMPLANT
SUT PROLENE 3 0 SH1 36 (SUTURE) ×12 IMPLANT
SUT PTFE CHORD X 20MM (SUTURE) ×6 IMPLANT
SUT SILK  1 MH (SUTURE) ×2
SUT SILK 1 MH (SUTURE) ×4 IMPLANT
SUT SILK 2 0 SH CR/8 (SUTURE) IMPLANT
SUT SILK 3 0 SH CR/8 (SUTURE) IMPLANT
SUT VIC AB 2-0 CTX 36 (SUTURE) IMPLANT
SUT VIC AB 3-0 SH 8-18 (SUTURE) IMPLANT
SUT VICRYL 2 TP 1 (SUTURE) IMPLANT
SYR 10ML LL (SYRINGE) ×3 IMPLANT
SYSTEM SAHARA CHEST DRAIN ATS (WOUND CARE) ×3 IMPLANT
TAPE CLOTH SURG 4X10 WHT LF (GAUZE/BANDAGES/DRESSINGS) ×3 IMPLANT
TOWEL GREEN STERILE (TOWEL DISPOSABLE) ×3 IMPLANT
TOWEL GREEN STERILE FF (TOWEL DISPOSABLE) ×3 IMPLANT
TRAY FOLEY SLVR 16FR TEMP STAT (SET/KITS/TRAYS/PACK) ×3 IMPLANT
TROCAR XCEL BLADELESS 5X75MML (TROCAR) ×3 IMPLANT
TROCAR XCEL NON-BLD 11X100MML (ENDOMECHANICALS) ×6 IMPLANT
TUBE SUCT INTRACARD DLP 20F (MISCELLANEOUS) ×3 IMPLANT
TUNNELER SHEATH ON-Q 11GX8 DSP (PAIN MANAGEMENT) ×3 IMPLANT
TUNNELER SHEATH ON-Q 16GX12 DP (PAIN MANAGEMENT) IMPLANT
UNDERPAD 30X30 (UNDERPADS AND DIAPERS) ×3 IMPLANT
WATER STERILE IRR 1000ML POUR (IV SOLUTION) ×6 IMPLANT
WIRE .035 3MM-J 145CM (WIRE) ×3 IMPLANT

## 2018-09-19 NOTE — Anesthesia Procedure Notes (Signed)
Central Venous Catheter Insertion Performed by: Roberts Gaudy, MD, anesthesiologist Start/End9/25/2019 6:55 AM, 09/19/2018 7:05 AM Patient location: Pre-op. Preanesthetic checklist: patient identified, IV checked, site marked, risks and benefits discussed, surgical consent, monitors and equipment checked, pre-op evaluation, timeout performed and anesthesia consent Hand hygiene performed  and maximum sterile barriers used  PA cath was placed.Swan type:thermodilution Procedure performed without using ultrasound guided technique. Attempts: 1 Following insertion, line sutured, dressing applied and Biopatch. Patient tolerated the procedure well with no immediate complications.

## 2018-09-19 NOTE — Anesthesia Postprocedure Evaluation (Signed)
Anesthesia Post Note  Patient: Kara Owens  Procedure(s) Performed: MINIMALLY INVASIVE MITRAL VALVE REPAIR (MVR). 50mm MEMO 4D RING. (Right Chest) TRANSESOPHAGEAL ECHOCARDIOGRAM (TEE) (N/A )     Patient location during evaluation: SICU Anesthesia Type: General Level of consciousness: awake and alert Pain management: pain level controlled Vital Signs Assessment: post-procedure vital signs reviewed and stable Respiratory status: spontaneous breathing, nonlabored ventilation and patient connected to nasal cannula oxygen Cardiovascular status: stable and blood pressure returned to baseline Postop Assessment: no apparent nausea or vomiting Anesthetic complications: no    Last Vitals:  Vitals:   09/19/18 2200 09/19/18 2215  BP:    Pulse: 85 83  Resp: 16 17  Temp:    SpO2: 100% 100%    Last Pain:  Vitals:   09/19/18 2200  TempSrc:   PainSc: 6                  Kara Owens

## 2018-09-19 NOTE — Progress Notes (Signed)
Swan cath pulled @ 2131. No ectopy. Catheter tip noted in tact. VS stable, Pt laying flat.

## 2018-09-19 NOTE — Op Note (Signed)
CARDIOTHORACIC SURGERY OPERATIVE NOTE  Date of Procedure:  09/19/2018  Preoperative Diagnosis: Severe Mitral Regurgitation  Postoperative Diagnosis: Same  Procedure:    Minimally-Invasive Mitral Valve Repair  Complex valvuloplasty including artificial Gore-tex neochord placement x10  Sorin Memo 4D Ring Annuloplasty (size 70mm, catalog # [id number], serial # [id number])    Surgeon: Valentina Gu. 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  Patient states that she was first noted to have a heart murmur on routine physical exam at least 20 years ago. She has been followed by Dr. 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 TEE performed in December 2018 revealed normal left ventricular function with bileaflet prolapse and moderate mitral regurgitation. Although the patient does not exercise on a regular basis, she has remained asymptomatic until recently.  In April of this year the patient began to experience frequent palpitations. She describes episodes where her heart pounds for this long as 15 to 20 minutes. She has developed exertional shortness of breath. She notes that if she goes up a  flight of stairs she is winded by the time she gets to the top. She was seen in follow-up by Dr. [name] and had a stress Myoview exam was felt to be low risk with a slight anterior defect thought to be related to breast artifact. Subsequent cardiac gated coronary CT angiogram revealed normal coronary arteries with coronary calcium score of 0. Patient underwent Holter event monitor and was found to have episodes of nonsustained ventricular tachycardia. Metoprolol was initiated but the patient began to experience worsening fatigue. Patient was subsequently seen in follow-up by Dr. [name] and underwent repeat transesophageal echocardiogram on July 27, 2018. TEE confirmed the presence of bileaflet prolapse with severe mitral regurgitation. Left ventricular systolic function remain normal. Cardiothoracic surgical consultation was requested.  The patient has been seen in consultation and counseled at length regarding the indications, risks and potential benefits of surgery.  All questions have been answered, and the patient provides full informed consent for the operation as described.    DETAILS OF THE OPERATIVE PROCEDURE  Preparation:  The patient is brought to the operating room on the above mentioned date and central monitoring was established by the anesthesia team including placement of Swan-Ganz catheter through the left internal jugular vein.  A radial arterial line is placed. The patient is placed in the supine position on the operating table.  Intravenous antibiotics are administered. General endotracheal anesthesia is induced uneventfully. The patient is initially intubated using a dual lumen endotracheal tube.  A Foley catheter is placed.  Baseline transesophageal echocardiogram was performed.  Findings were notable for myxomatous degenerative disease of the mitral valve with bileaflet prolapse.  There was severe prolapse involving the middle scallop of the posterior leaflet.  There  are  multiple elongated chordae tendinae but there were no ruptured cords.  The mitral annulus was dilated.  There was normal left ventricular size and systolic function.  The aortic valve appeared normal.  Right ventricular size and function was normal.  There was trivial tricuspid regurgitation.  A soft roll is placed behind the patient's left scapula and the neck gently extended and turned to the left.   The patient's right neck, chest, abdomen, both groins, and both lower extremities are prepared and draped in a sterile manner. A time out procedure is performed.  Surgical Approach:  A right miniature anterolateral thoracotomy incision is performed. The incision is placed just lateral to and superior to the right nipple. The pectoralis major muscle is retracted medially and completely preserved. The right pleural space is entered through the 4th intercostal space. A soft tissue retractor is placed.  Two 11 mm ports are placed through separate stab incisions inferiorly. The right pleural space is insufflated continuously with carbon dioxide gas through the posterior port during the remainder of the operation.  A pledgeted sutures placed through the dome of the right hemidiaphragm and retracted inferiorly to facilitate exposure.  A longitudinal incision is made in the pericardium 3 cm anterior to the phrenic nerve and silk traction sutures are placed on either side of the incision for exposure.   Extracorporeal Cardiopulmonary Bypass and Myocardial Protection:  A small incision is made in the right inguinal crease and the anterior surface of the right common femoral artery and right common femoral vein are identified.  The patient is placed in Trendelenburg position. The right internal jugular vein is cannulated with Seldinger technique and a guidewire advanced into the right atrium. The patient is heparinized systemically. The right internal jugular vein is cannulated with a 14 [place] pediatric femoral  venous cannula. Pursestring sutures are placed on the anterior surface of the right common femoral vein and right common femoral artery. The right common femoral vein is cannulated with the Seldinger technique and a guidewire is advanced under transesophageal echocardiogram guidance through the right atrium. The femoral vein is cannulated with a long 22 French femoral venous cannula. The right common femoral artery is cannulated with Seldinger technique and a flexible guidewire is advanced until it can be appreciated intraluminally in the descending thoracic aorta on transesophageal echocardiogram. The femoral artery is cannulated with an 18 French femoral arterial cannula.  Adequate heparinization is verified.     The entire pre-bypass portion of the operation was notable for stable hemodynamics.  Cardiopulmonary bypass was begun.  Vacuum assist venous drainage is utilized. The incision in the pericardium is extended in both directions. Venous drainage and exposure are notably excellent.  An antegrade cardioplegia cannula is placed in the ascending aorta.    The patient is cooled to 28C systemic temperature.  The aortic cross clamp is applied and cardioplegia is delivered in an antegrade fashion through the aortic root using modified [place] cold blood cardioplegia ([facility] blood cardioplegia protocol).   The initial cardioplegic arrest is rapid with early diastolic arrest.   Myocardial protection was felt to be excellent.   Mitral Valve Repair:  A left atriotomy incision was performed through the interatrial groove and extended partially across the back wall of the left atrium after opening the oblique sinus inferiorly.  The mitral valve is exposed using a self-retaining retractor.  The mitral valve was inspected and notable for forme fruste variant of Barlow's type myxomatous degenerative disease.  The mitral valve was  relatively large with bileaflet prolapse.  There was severe prolapse involving  the middle scallop of the posterior leaflet.  Posterior leaflet was somewhat tall.  There were multiple distorted elongated primary chordae tendinae but there were no ruptured cords.  Artificial neochord placement was performed using Chord-X multi-strand CV-4 Goretex pre-measured loops.  The appropriate cord length was measured from corresponding normal length primary cords from the P3 segment of the posterior leaflet. The papillary muscle suture of the first Chord-X multi-strand suture was placed through the head of the posterior papillary muscle in a horizontal mattress fashion and tied over Teflon felt pledgets. Each of the three pre-measured loops were then reimplanted into the free margin of the P2 segment of the posterior leaflet on the posterior side of midline.  The papillary muscle suture of a second Chord-X multi-strand suture was placed through the head of the anterior papillary muscle in a horizontal mattress fashion and tied over Teflon felt pledgets. Two of the three pre-measured loops were then reimplanted into the free margin of the P2 segment of the posterior leaflet on the anterior side of midline.   Interrupted 2-0 Ethibond horizontal mattress sutures are placed circumferentially around the entire mitral valve annulus.  The valve was tested with saline and appeared competent even without ring annuloplasty complete. The valve was sized to a 32 mm annuloplasty ring, based upon the transverse distance between the left and right commissures and the height of the anterior leaflet, corresponding to a size just slightly larger than the overall surface area of the anterior leaflet.  A Sorin Memo 4D annuloplasty ring (size 32 mm, catalog #[id number], serial [id number]) was secured in place uneventfully. All ring sutures were secured using a Cor-knot device.    The valve was tested with saline and appeared competent. There is no residual leak. There was a broad, symmetrical line of coaptation of the  anterior and posterior leaflet which was confirmed using the blue ink test.  Rewarming is begun.   Procedure Completion:  The atriotomy was closed using a 2-layer closure of running 3-0 Prolene suture after placing a sump drain across the mitral valve to serve as a left ventricular vent.  One final dose of warm "reanimation dose" cardioplegia was administered through the aortic root.  The aortic cross clamp was removed after a total cross clamp time of 85 minutes.  Epicardial pacing wires are fixed to the inferior wall of the right ventricule and to the right atrial appendage. The patient is rewarmed to 37C temperature. The left ventricular vent is removed.  The patient is ventilated and flow volumes turndown while the mitral valve repair is inspected using transesophageal echocardiogram. The valve repair appears intact with no residual leak. The antegrade cardioplegia cannula is now removed. The patient is weaned and disconnected from cardiopulmonary bypass.  The patient's rhythm at separation from bypass was sinus.  The patient was weaned from bypass without any inotropic support. Total cardiopulmonary bypass time for the operation was 125 minutes.  Followup transesophageal echocardiogram performed after separation from bypass revealed a well-seated annuloplasty ring in the mitral position with a normal functioning mitral valve. There was no residual leak.  Left ventricular function was unchanged from preoperatively.  The mean gradient across the mitral valve was estimated to be 3 mmHg.  The femoral arterial and venous cannulae were removed uneventfully. There was a palpable pulse in the distal right common femoral artery after removal of the cannula. Protamine was administered to reverse the anticoagulation. The right internal  jugular cannula was removed and manual pressure held on the neck for 15 minutes.  Single lung ventilation was begun. The atriotomy closure was inspected for hemostasis. The  pericardial sac was drained using a 28 French Bard drain placed through the anterior port incision.  The right pleural space is irrigated with saline solution and inspected for hemostasis.   The On-Q pain management system is utilized for postoperative analgesia.  A single lumen catheter is passed through the subcutaneous tissues from the anterior chest wall to the posterior port incision.  The catheter was then passed through the port incision into the pleural space and tunneled into the subpleural space posteriorly to cover the second through the sixth intercostal nerve roots.  The catheter was flushed with 0.5% bupivacaine solution and ultimately connected to a continuous infusion pump.  The right pleural space was drained using a 28 French Bard drain placed through the posterior port incision. The miniature thoracotomy incision was closed in multiple layers in routine fashion. The right groin incision was inspected for hemostasis and closed in multiple layers in routine fashion.  The post-bypass portion of the operation was notable for stable rhythm and hemodynamics.  No blood products were administered during the operation.   Disposition:  The patient tolerated the procedure well.  The patient was extubated in the operating room and subsequently transported to the surgical intensive care unit in stable condition. There were no intraoperative complications. All sponge instrument and needle counts are verified correct at completion of the operation.     Valentina Gu. Roxy Manns MD 09/19/2018 12:34 PM

## 2018-09-19 NOTE — Progress Notes (Signed)
Patient interviewed in the preop area. Patient able to confirm name, DOB, procedure, surgeon, NKDA, no metal in body, npo status and no pain. CRNA and family at patient bedside.   Leatha Gilding, RN

## 2018-09-19 NOTE — Anesthesia Procedure Notes (Signed)
Procedure Name: Intubation Date/Time: 09/19/2018 8:25 AM Performed by: Teressa Lower., CRNA Pre-anesthesia Checklist: Patient identified, Emergency Drugs available, Suction available and Patient being monitored Patient Re-evaluated:Patient Re-evaluated prior to induction Oxygen Delivery Method: Circle system utilized Preoxygenation: Pre-oxygenation with 100% oxygen Induction Type: IV induction Ventilation: Mask ventilation without difficulty Laryngoscope Size: Mac and 3 Grade View: Grade I Tube type: Oral Endobronchial tube: Left, Double lumen EBT, EBT position confirmed by fiberoptic bronchoscope and EBT position confirmed by auscultation and 37 Fr Number of attempts: 1 Airway Equipment and Method: Stylet and Oral airway Placement Confirmation: ETT inserted through vocal cords under direct vision,  positive ETCO2 and breath sounds checked- equal and bilateral Secured at: 31 cm Tube secured with: Tape Dental Injury: Teeth and Oropharynx as per pre-operative assessment

## 2018-09-19 NOTE — Anesthesia Procedure Notes (Signed)
Arterial Line Insertion Start/End9/25/2019 6:40 AM, 09/19/2018 6:46 AM Performed by: Teressa Lower., CRNA, CRNA  Patient location: Pre-op. Preanesthetic checklist: patient identified, IV checked, site marked, risks and benefits discussed, surgical consent, monitors and equipment checked, pre-op evaluation, timeout performed and anesthesia consent Lidocaine 1% used for infiltration Right, radial was placed Catheter size: 20 G Hand hygiene performed , maximum sterile barriers used  and Seldinger technique used Allen's test indicative of satisfactory collateral circulation Attempts: 1 Procedure performed without using ultrasound guided technique. Following insertion, dressing applied and Biopatch. Post procedure assessment: normal and unchanged  Patient tolerated the procedure well with no immediate complications.

## 2018-09-19 NOTE — Anesthesia Preprocedure Evaluation (Signed)
Anesthesia Evaluation  Patient identified by MRN, date of birth, ID band Patient awake    Reviewed: Allergy & Precautions, NPO status , Patient's Chart, lab work & pertinent test results  Airway Mallampati: II  TM Distance: >3 FB Neck ROM: Full    Dental  (+) Teeth Intact, Dental Advisory Given   Pulmonary    breath sounds clear to auscultation       Cardiovascular  Rhythm:Regular Rate:Normal + Systolic murmurs    Neuro/Psych    GI/Hepatic   Endo/Other    Renal/GU      Musculoskeletal   Abdominal   Peds  Hematology   Anesthesia Other Findings   Reproductive/Obstetrics                             Anesthesia Physical Anesthesia Plan  ASA: III  Anesthesia Plan: General   Post-op Pain Management:    Induction: Intravenous  PONV Risk Score and Plan: Ondansetron and Dexamethasone  Airway Management Planned: Double Lumen EBT  Additional Equipment: PA Cath, 3D TEE, Arterial line and Ultrasound Guidance Line Placement  Intra-op Plan:   Post-operative Plan: Possible Post-op intubation/ventilation  Informed Consent: I have reviewed the patients History and Physical, chart, labs and discussed the procedure including the risks, benefits and alternatives for the proposed anesthesia with the patient or authorized representative who has indicated his/her understanding and acceptance.   Dental advisory given  Plan Discussed with: CRNA and Anesthesiologist  Anesthesia Plan Comments:         Anesthesia Quick Evaluation

## 2018-09-19 NOTE — Transfer of Care (Signed)
Immediate Anesthesia Transfer of Care Note  Patient: Bleu Moisan  Procedure(s) Performed: MINIMALLY INVASIVE MITRAL VALVE REPAIR (MVR). 76mm MEMO 4D RING. (Right Chest) TRANSESOPHAGEAL ECHOCARDIOGRAM (TEE) (N/A )  Patient Location: SICU  Anesthesia Type:General  Level of Consciousness: awake, alert  and oriented  Airway & Oxygen Therapy: Patient Spontanous Breathing and Patient connected to face mask oxygen  Post-op Assessment: Report given to RN and Post -op Vital signs reviewed and stable  Post vital signs: Reviewed and stable  Last Vitals:  Vitals Value Taken Time  BP    Temp    Pulse    Resp    SpO2      Last Pain:  Vitals:   09/19/18 0604  TempSrc: Oral  PainSc: 0-No pain      Patients Stated Pain Goal: 3 (88/71/95 9747)  Complications: No apparent anesthesia complications

## 2018-09-19 NOTE — Brief Op Note (Signed)
09/19/2018  12:32 PM  PATIENT:  Kara Owens  47 y.o. female  PRE-OPERATIVE DIAGNOSIS:  MR  POST-OPERATIVE DIAGNOSIS:  MR  PROCEDURE:  Procedure(s) with comments:  MINIMALLY INVASIVE MITRAL VALVE REPAIR -Annuloplasty with a 54mm Sorin Memo 4D Ring -Placement of NeoChords x 10 with Chord X   TRANSESOPHAGEAL ECHOCARDIOGRAM (TEE) (N/A)  SURGEON:  Surgeon(s) and Role:    [name], MD - Primary  PHYSICIAN ASSISTANT: Erin Barrett PA-C  ANESTHESIA:   general  EBL:  450 mL   BLOOD ADMINISTERED: CELLSAVER  DRAINS: Right Pleural Chest Tubes, On-Q pain pump   LOCAL MEDICATIONS USED:  BUPIVICAINE   SPECIMEN:  No Specimen  DISPOSITION OF SPECIMEN:  N/A  COUNTS:  YES  TOURNIQUET:  * No tourniquets in log *  DICTATION: .Dragon Dictation  PLAN OF CARE: Admit to inpatient   PATIENT DISPOSITION:  ICU - intubated and hemodynamically stable.   Delay start of Pharmacological VTE agent (>24hrs) due to surgical blood loss or risk of bleeding: yes

## 2018-09-19 NOTE — Progress Notes (Signed)
Patient ID: Kara Owens, female   DOB: 01/25/1971, 47 y.o.   MRN: [id number] EVENING ROUNDS NOTE :     [address]       [place] [address]             [phone]                 Day of Surgery Procedure(s) (LRB): MINIMALLY INVASIVE MITRAL VALVE REPAIR (MVR). 21mm MEMO 4D RING. (Right) TRANSESOPHAGEAL ECHOCARDIOGRAM (TEE) (N/A)  Total Length of Stay:  LOS: 0 days  BP 112/69   Pulse 85   Temp 99.7 F (37.6 C)   Resp 18   Ht 5\' 8"  (1.727 m)   Wt 70.8 kg   SpO2 100%   BMI 23.72 kg/m   .Intake/Output      09/25 0701 - 09/26 0700   I.V. (mL/kg) 3270.1 (46.2)   Blood 250   IV Piggyback 906.6   Total Intake(mL/kg) 4426.7 (62.5)   Urine (mL/kg/hr) 3045 (3.5)   Blood 450   Chest Tube 370   Total Output 3865   Net +561.7         . sodium chloride 20 mL/hr at 09/19/18 1900  . sodium chloride Stopped (09/19/18 1416)  . [START ON 09/20/2018] sodium chloride    . sodium chloride    . albumin human 12.5 g (09/19/18 1847)  . cefUROXime (ZINACEF)  IV Stopped (09/19/18 1840)  . dexmedetomidine (PRECEDEX) IV infusion Stopped (09/19/18 1315)  . famotidine (PEPCID) IV Stopped (09/19/18 1522)  . insulin (NOVOLIN-R) infusion 1.2 mL/hr at 09/19/18 1900  . lactated ringers    . lactated ringers    . lactated ringers Stopped (09/19/18 1848)  . nitroGLYCERIN Stopped (09/19/18 1315)  . phenylephrine (NEO-SYNEPHRINE) Adult infusion 40 mcg/min (09/19/18 1900)  . vancomycin       Lab Results  Component Value Date   WBC 24.4 (H) 09/19/2018   HGB 10.6 (L) 09/19/2018   HCT 32.6 (L) 09/19/2018   PLT 148 (L) 09/19/2018   GLUCOSE 106 (H) 09/19/2018   CHOL 175 09/12/2008   TRIG 40 09/12/2008   HDL 57.8 09/12/2008   LDLCALC 109 (H) 09/12/2008   ALT 17 09/17/2018   AST 19 09/17/2018   NA 137 09/19/2018   K 3.6 09/19/2018   CL 105 09/19/2018   CREATININE 0.30 (L) 09/19/2018   BUN 3 (L) 09/19/2018   CO2 21 (L) 09/17/2018   TSH 1.290 04/30/2018   INR 1.48  09/19/2018   HGBA1C 5.5 09/17/2018   Nausea since extubated , start reglan  Low chest tube output   Grace Isaac MD  Beeper [phone] Office [phone] 09/19/2018 7:17 PM

## 2018-09-19 NOTE — Anesthesia Procedure Notes (Signed)
Central Venous Catheter Insertion Performed by: Roberts Gaudy, MD, anesthesiologist Start/End9/25/2019 6:55 AM, 09/19/2018 7:05 AM Patient location: Pre-op. Preanesthetic checklist: patient identified, IV checked, site marked, risks and benefits discussed, surgical consent, monitors and equipment checked, pre-op evaluation, timeout performed and anesthesia consent Lidocaine 1% used for infiltration and patient sedated Hand hygiene performed  and maximum sterile barriers used  Catheter size: 8.5 Fr Sheath introducer Procedure performed using ultrasound guided technique. Ultrasound Notes:anatomy identified, needle tip was noted to be adjacent to the nerve/plexus identified, no ultrasound evidence of intravascular and/or intraneural injection and image(s) printed for medical record Attempts: 1 Following insertion, line sutured and dressing applied. Post procedure assessment: blood return through all ports, free fluid flow and no air  Patient tolerated the procedure well with no immediate complications.

## 2018-09-19 NOTE — Progress Notes (Signed)
  Echocardiogram Echocardiogram Transesophageal has been performed.  Adri Schloss L Androw 09/19/2018, 8:46 AM

## 2018-09-19 NOTE — Interval H&P Note (Signed)
History and Physical Interval Note:  09/19/2018 5:54 AM  Kara Owens  has presented today for surgery, with the diagnosis of MR  The various methods of treatment have been discussed with the patient and family. After consideration of risks, benefits and other options for treatment, the patient has consented to  Procedure(s) with comments: [place] (MVR) (Right) - GLUTARALDEHYDE TRANSESOPHAGEAL ECHOCARDIOGRAM (TEE) (N/A) as a surgical intervention .  The patient's history has been reviewed, patient examined, no change in status, stable for surgery.  I have reviewed the patient's chart and labs.  Questions were answered to the patient's satisfaction.     [name]

## 2018-09-19 NOTE — Plan of Care (Signed)

## 2018-09-20 ENCOUNTER — Inpatient Hospital Stay (HOSPITAL_COMMUNITY): Payer: 59

## 2018-09-20 ENCOUNTER — Encounter (HOSPITAL_COMMUNITY): Payer: Self-pay | Admitting: Thoracic Surgery (Cardiothoracic Vascular Surgery)

## 2018-09-20 LAB — CBC
HCT: 28.7 % — ABNORMAL LOW (ref 36.0–46.0)
HCT: 29.9 % — ABNORMAL LOW (ref 36.0–46.0)
HEMOGLOBIN: 9.4 g/dL — AB (ref 12.0–15.0)
Hemoglobin: 9.5 g/dL — ABNORMAL LOW (ref 12.0–15.0)
MCH: 31.6 pg (ref 26.0–34.0)
MCH: 30.9 pg (ref 26.0–34.0)
MCHC: 33.1 g/dL (ref 30.0–36.0)
MCHC: 31.4 g/dL (ref 30.0–36.0)
MCV: 95.3 fL (ref 78.0–100.0)
MCV: 98.4 fL (ref 78.0–100.0)
PLATELETS: 112 10*3/uL — AB (ref 150–400)
Platelets: 117 10*3/uL — ABNORMAL LOW (ref 150–400)
RBC: 3.01 MIL/uL — AB (ref 3.87–5.11)
RBC: 3.04 MIL/uL — ABNORMAL LOW (ref 3.87–5.11)
RDW: 13.4 % (ref 11.5–15.5)
RDW: 13.6 % (ref 11.5–15.5)
WBC: 14.8 10*3/uL — ABNORMAL HIGH (ref 4.0–10.5)
WBC: 15.5 10*3/uL — ABNORMAL HIGH (ref 4.0–10.5)

## 2018-09-20 LAB — BASIC METABOLIC PANEL
Anion gap: 5 (ref 5–15)
BUN: 5 mg/dL — ABNORMAL LOW (ref 6–20)
CHLORIDE: 109 mmol/L (ref 98–111)
CO2: 21 mmol/L — ABNORMAL LOW (ref 22–32)
CREATININE: 0.49 mg/dL (ref 0.44–1.00)
Calcium: 6.7 mg/dL — ABNORMAL LOW (ref 8.9–10.3)
Glucose, Bld: 116 mg/dL — ABNORMAL HIGH (ref 70–99)
POTASSIUM: 4 mmol/L (ref 3.5–5.1)
SODIUM: 135 mmol/L (ref 135–145)

## 2018-09-20 LAB — GLUCOSE, CAPILLARY
GLUCOSE-CAPILLARY: 103 mg/dL — AB (ref 70–99)
GLUCOSE-CAPILLARY: 104 mg/dL — AB (ref 70–99)
GLUCOSE-CAPILLARY: 111 mg/dL — AB (ref 70–99)
GLUCOSE-CAPILLARY: 98 mg/dL (ref 70–99)
GLUCOSE-CAPILLARY: 99 mg/dL (ref 70–99)
Glucose-Capillary: 103 mg/dL — ABNORMAL HIGH (ref 70–99)
Glucose-Capillary: 110 mg/dL — ABNORMAL HIGH (ref 70–99)
Glucose-Capillary: 110 mg/dL — ABNORMAL HIGH (ref 70–99)
Glucose-Capillary: 111 mg/dL — ABNORMAL HIGH (ref 70–99)
Glucose-Capillary: 112 mg/dL — ABNORMAL HIGH (ref 70–99)
Glucose-Capillary: 116 mg/dL — ABNORMAL HIGH (ref 70–99)
Glucose-Capillary: 125 mg/dL — ABNORMAL HIGH (ref 70–99)
Glucose-Capillary: 137 mg/dL — ABNORMAL HIGH (ref 70–99)
Glucose-Capillary: 162 mg/dL — ABNORMAL HIGH (ref 70–99)
Glucose-Capillary: 84 mg/dL (ref 70–99)

## 2018-09-20 LAB — MAGNESIUM
MAGNESIUM: 2.5 mg/dL — AB (ref 1.7–2.4)
MAGNESIUM: 2.4 mg/dL (ref 1.7–2.4)

## 2018-09-20 LAB — CREATININE, SERUM
CREATININE: 0.6 mg/dL (ref 0.44–1.00)
GFR calc Af Amer: >60 mL/min (ref >60)

## 2018-09-20 MED ORDER — INSULIN ASPART 100 UNIT/ML ~~LOC~~ SOLN: 0.0000 [IU] | SUBCUTANEOUS | Status: DC

## 2018-09-20 MED ORDER — POTASSIUM CHLORIDE CRYS ER 20 MEQ PO TBCR: 20.0000 meq | EXTENDED_RELEASE_TABLET | Freq: Every day | ORAL | Status: DC

## 2018-09-20 MED ORDER — METOCLOPRAMIDE HCL 5 MG/ML IJ SOLN
10.0000 mg | Freq: Four times a day (QID) | INTRAMUSCULAR | Status: AC
  Administered 2018-09-20 – 2018-09-22 (×7): 10 mg via INTRAVENOUS
  Filled 2018-09-20 (×7): qty 2

## 2018-09-20 MED ORDER — PROMETHAZINE HCL 25 MG/ML IJ SOLN: 12.5000 mg | Freq: Four times a day (QID) | INTRAMUSCULAR | Status: DC | PRN

## 2018-09-20 MED ORDER — POTASSIUM CHLORIDE 10 MEQ/50ML IV SOLN
10.0000 meq | INTRAVENOUS | Status: AC
  Administered 2018-09-20 (×2): 10 meq via INTRAVENOUS
  Filled 2018-09-20 (×2): qty 50

## 2018-09-20 MED ORDER — KETOROLAC TROMETHAMINE 15 MG/ML IJ SOLN
15.0000 mg | Freq: Four times a day (QID) | INTRAMUSCULAR | Status: AC
  Administered 2018-09-20 – 2018-09-21 (×5): 15 mg via INTRAVENOUS
  Filled 2018-09-20 (×5): qty 1

## 2018-09-20 MED ORDER — ASPIRIN EC 325 MG PO TBEC
325.0000 mg | DELAYED_RELEASE_TABLET | Freq: Every day | ORAL | Status: AC
  Administered 2018-09-20: 325 mg via ORAL
  Filled 2018-09-20: qty 1

## 2018-09-20 MED ORDER — ENOXAPARIN SODIUM 40 MG/0.4ML ~~LOC~~ SOLN: 40.0000 mg | Freq: Every day | SUBCUTANEOUS | Status: DC

## 2018-09-20 MED ORDER — SODIUM CHLORIDE 0.9 % IV SOLN
INTRAVENOUS | Status: DC | PRN
  Administered 2018-09-20: 1000 mL via INTRAVENOUS

## 2018-09-20 MED ORDER — PROMETHAZINE HCL 25 MG/ML IJ SOLN
12.5000 mg | Freq: Four times a day (QID) | INTRAMUSCULAR | Status: DC | PRN
  Administered 2018-09-20 – 2018-09-22 (×3): 12.5 mg via INTRAVENOUS
  Filled 2018-09-20 (×3): qty 1

## 2018-09-20 MED ORDER — MOVING RIGHT ALONG BOOK
Freq: Once | Status: AC
  Administered 2018-09-20: 08:00:00
  Filled 2018-09-20: qty 1

## 2018-09-20 MED ORDER — METOPROLOL TARTRATE 12.5 MG HALF TABLET
12.5000 mg | ORAL_TABLET | Freq: Two times a day (BID) | ORAL | Status: DC
  Administered 2018-09-20: 12.5 mg via ORAL
  Filled 2018-09-20: qty 1

## 2018-09-20 MED ORDER — FUROSEMIDE 40 MG PO TABS
40.0000 mg | ORAL_TABLET | Freq: Every day | ORAL | Status: AC
  Administered 2018-09-21 – 2018-09-23 (×3): 40 mg via ORAL
  Filled 2018-09-20: qty 1
  Filled 2018-09-20: qty 2
  Filled 2018-09-20: qty 1

## 2018-09-20 MED ORDER — WARFARIN - PHYSICIAN DOSING INPATIENT
Freq: Every day | Status: DC
  Administered 2018-09-20 – 2018-09-23 (×2)

## 2018-09-20 MED ORDER — FUROSEMIDE 10 MG/ML IJ SOLN
20.0000 mg | Freq: Two times a day (BID) | INTRAMUSCULAR | Status: DC
  Administered 2018-09-20: 20 mg via INTRAVENOUS
  Filled 2018-09-20: qty 2

## 2018-09-20 MED ORDER — WARFARIN SODIUM 2.5 MG PO TABS
2.5000 mg | ORAL_TABLET | Freq: Every day | ORAL | Status: DC
  Administered 2018-09-20 – 2018-09-23 (×4): 2.5 mg via ORAL
  Filled 2018-09-20 (×4): qty 1

## 2018-09-20 MED ORDER — ASPIRIN EC 81 MG PO TBEC
81.0000 mg | DELAYED_RELEASE_TABLET | Freq: Every day | ORAL | Status: DC
  Administered 2018-09-21 – 2018-09-24 (×4): 81 mg via ORAL
  Filled 2018-09-20 (×4): qty 1

## 2018-09-20 NOTE — Discharge Summary (Signed)
Physician Discharge Summary  Patient ID: Kara Owens MRN: [id number] DOB/AGE: [birth year] 49 y.o.  Admit date: 09/19/2018 Discharge date: 09/24/2018  Admission Diagnoses:  Patient Active Problem List   Diagnosis Date Noted  . MVP (mitral valve prolapse)   . Palpitations   . Mitral regurgitation   . Fatigue   . Chest pain   . LEG EDEMA 04/03/2009  . SYNCOPE 02/16/2009  . ANEMIA-IRON DEFICIENCY 09/18/2008  . Mitral valve prolapse 09/18/2008  . ASTHMATIC BRONCHITIS, ACUTE 09/12/2007  . PHARYNGITIS, ACUTE 09/07/2007  . SKIN CANCER, HX OF 09/07/2007   Discharge Diagnoses:   Patient Active Problem List   Diagnosis Date Noted  . S/P minimally invasive mitral valve repair 09/19/2018  . MVP (mitral valve prolapse)   . Palpitations   . Mitral regurgitation   . Fatigue   . Chest pain   . LEG EDEMA 04/03/2009  . SYNCOPE 02/16/2009  . ANEMIA-IRON DEFICIENCY 09/18/2008  . Mitral valve prolapse 09/18/2008  . ASTHMATIC BRONCHITIS, ACUTE 09/12/2007  . PHARYNGITIS, ACUTE 09/07/2007  . SKIN CANCER, HX OF 09/07/2007   Discharged Condition: good  History of Present Illness:  Kara Owens is a 47 yo white female with known history of mitral valve prolapse and mitral regurgitation.  Patient states that she was first noted to have a heart murmur on routine physical exam at least 20 years ago. She has been followed by Dr. 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 TEE performed in December 2018 revealed normal left ventricular function with bileaflet prolapse and moderate mitral regurgitation. Although the patient does not exercise on a regular basis, she has remained asymptomatic until recently.  In April of this year the patient began to experience frequent palpitations. She describes episodes where her heart  pounds for this long as 15 to 20 minutes. She has developed exertional shortness of breath. She notes that if she goes up a flight of stairs she is winded by the time she gets to the top. She was seen in follow-up by Dr. [name] and had a stress Myoview exam was felt to be low risk with a slight anterior defect thought to be related to breast artifact. Subsequent cardiac gated coronary CT angiogram revealed normal coronary arteries with coronary calcium score of 0. Patient underwent Holter event monitor and was found to have episodes of nonsustained ventricular tachycardia. Metoprolol was initiated but the patient began to experience worsening fatigue. Patient was subsequently seen in follow-up by Dr. [name] and underwent repeat transesophageal echocardiogram on July 27, 2018. TEE confirmed the presence of bileaflet prolapse with severe mitral regurgitation. Left ventricular systolic function remain normal. Cardiothoracic surgical consultation was requested.  She was evaluated by Dr. Roxy Manns at which time she admits to not exercising on a regular basis.  She does walk once or twice a week and has noticed a decrease in her exercise tolerance.   She gets short of breath with moderate exertion such as going up a flight of stairs. She denies resting shortness of breath, orthopnea, or PND. She has occasional mild lower extremity edema.  It was felt the patient should undergo surgical intervention on her Mitral Valve, via a Minimally invasive approach.  The risks and benefits of the procedure were explained to the patient and she was agreeable to proceed.  Hospital Course:   Kara Owens presented to [facility] [place] on 09/19/2018.  She was taken to the operating room and underwent a Minimally Invasive Mitral Valve Repair.  She tolerated the procedure without difficulty and was taken to the SICU in stable condition.  She was extubated the evening of surgery without difficulty.  During her stay in the SICU  the patient was weaned off Neo-synephrine as tolerated.  She had issues with nausea, vomiting and required additional anti-emetics with resolution of symptoms.  She was started on low dose coumadin for her Mitral Valve Repair.  She had expected post operative thrombocytopenia and Lovenox was discontinued.  She was maintaining NSR and was medically stable for transfer to the telemetry unit on 09/21/2018.  She continued to make progress.  She developed Atrial Fibrillation with RVR.  She was treated with IV Amiodarone with successful conversion to NSR.  She was transitioned to oral Amiodarone prior to discharge.  Her chest tubes and pacing wires were removed without difficulty.  Her INR is 1.48.  She will be discharged home on 4 mg coumadin daily with a goal INR range of 2.5-3.0.  She is ambulating independently .  Her incisions are healing without evidence of infection.  She is medically stable for discharge home today.   Significant Diagnostic Studies:   - Left ventricle: The cavity size was normal. Wall thickness was   normal. Systolic function was normal. The estimated ejection   fraction was in the range of 60% to 65%. - Aortic valve: No evidence of vegetation. There was trivial   regurgitation. - Mitral valve: Moderate prolapse, involving the anterior leaflet   and the posterior leaflet. There was severe regurgitation. - Left atrium: No evidence of thrombus in the atrial cavity or   appendage.  Impressions:  - Normal LV function   Moderate MVP involving both the anterior and posterior leaflts.   3-D images were obtained of the MV .   Severe mitral regurgitation   Her mitral regurgitation has worsened since her previous TEE in   Dec. 2018.  Treatments: surgery:    Minimally-Invasive Mitral Valve Repair             Complex valvuloplasty including artificial Gore-tex neochord placement x10             Sorin Memo 4D Ring Annuloplasty (size 19mm, catalog # [id number], serial #  [id number])  Discharge Exam: Blood pressure 113/78, pulse 88, temperature 98.2 F (36.8 C), temperature source Oral, resp. rate (!) 26, height 5\' 8"  (1.727 m), weight 69.3 kg, SpO2 99 %.  General appearance: alert, cooperative and no distress Heart: regular rate and rhythm Lungs: clear to auscultation bilaterally Abdomen: soft, non-tender; bowel sounds normal; no masses,  no organomegaly Extremities: edema trace Wound: clean and dry  Disposition: Home  Discharge Medications:  The patient has been discharged on:   1.Beta Blocker:  Yes [ x  ]                              No   [   ]                              If No, reason:  2.Ace Inhibitor/ARB: Yes [   ]                                     No  [  x  ]                                     If No, reason:labile BP  3.Statin:   Yes [   ]                  No  [x   ]                  If No, reason: No CAD  4.[place]:  Yes  [ x  ]                  No   [   ]                  If No, reason:     Discharge Instructions    Amb Referral to Cardiac Rehabilitation   Complete by:  As directed    Diagnosis:  Valve Repair   Valve:  Mitral     Allergies as of 09/24/2018   No Known Allergies     Medication List    STOP taking these medications   ibuprofen 200 MG tablet Commonly known as:  ADVIL,MOTRIN     TAKE these medications   acetaminophen 500 MG tablet Commonly known as:  TYLENOL Take 1,000 mg by mouth every 6 (six) hours as needed (for pain).   AIRBORNE Chew Chew 1 tablet by mouth 2 (two) times daily as needed ([place]).   amiodarone 200 MG tablet Commonly known as:  PACERONE Take 2 tablets (400 mg total) by mouth 2 (two) times daily. X 7 days, then decrease to 200 mg daily   aspirin 81 MG EC tablet Take 1 tablet (81 mg total) by mouth daily.   metoprolol tartrate 25 MG tablet Commonly known as:  LOPRESSOR Take 0.5 tablets (12.5 mg total) by mouth 2 (two) times daily. What changed:  how much to take    Oxycodone HCl 10 MG Tabs Take 1 tablet (10 mg total) by mouth every 4 (four) hours as needed for severe pain.   polyethylene glycol packet Commonly known as:  MIRALAX / GLYCOLAX Take 17 g by mouth daily.   promethazine 12.5 MG tablet Commonly known as:  PHENERGAN Take 1 tablet (12.5 mg total) by mouth every 6 (six) hours as needed for nausea or vomiting.   warfarin 4 MG tablet Commonly known as:  COUMADIN Take 1 tablet (4 mg total) by mouth daily at 6 PM.      Follow-up Information    [facility] [place] Follow up on 10/08/2018.   Specialty:  Cardiothoracic Surgery Why:  Appointment is at 3:30, please get CXR at 3:00 at [facility] located on first floor of our office building Contact information: [place], [place] Follow up on 09/28/2018.   Specialty:  Cardiology Why:  Please arrive 15 minutes early for your 10:30am coumadin clinic appointment to have your INR monitored.  Contact information: [address], [place] [phone]          Signed: Ellwood Handler 09/24/2018, 7:41 AM

## 2018-09-20 NOTE — Progress Notes (Signed)
Patient ID: Kara Owens, female   DOB: June 11, 1971, 47 y.o.   MRN: [id number] TCTS Evening Rounds:  Hemodynamically stable with low normal BP in sinus rhythm 80's. Got Lopressor this am but will hold for now until BP rises.  Continues to diurese well.  Glucose under good control  Chest tube output low.

## 2018-09-20 NOTE — Progress Notes (Signed)
Anesthesiology Follow-up:  Awake and alert, had nausea this morning none at present.  VS: T- 36.8 BP- 92/63 HR- 84 (SR) RR- 16 O2 Sat- 98% on RA  K-4.0 Na- 135 BUN/Cr- 5/0.60 glucose- 116 H/H- 9.4/29.9 platelets- 112,000   CXR; mild R. atelectasis, chest tube in good position, no pneumothorax.  47 year old female, one day S/P minimally invasive MV repair, extubated in OR at the end of surgery. Stable post-op course.  Roberts Gaudy

## 2018-09-20 NOTE — Addendum Note (Signed)
Addendum  created 09/20/18 2325 by Roberts Gaudy, MD   Pend clinical note

## 2018-09-20 NOTE — Progress Notes (Addendum)
[facility] DAILY ICU PROGRESS NOTE                   [address]            [place] [address]          [phone]   1 Day Post-Op Procedure(s) (LRB): MINIMALLY INVASIVE MITRAL VALVE REPAIR (MVR). 35mm MEMO 4D RING. (Right) TRANSESOPHAGEAL ECHOCARDIOGRAM (TEE) (N/A)  Total Length of Stay:  LOS: 1 day   Subjective:  Patient with nausea/vomiting this morning.  Objective: Vital signs in last 24 hours: Temp:  [97 F (36.1 C)-99.7 F (37.6 C)] 98.2 F (36.8 C) (09/26 0800) Pulse Rate:  [79-100] 85 (09/26 0800) Cardiac Rhythm: Normal sinus rhythm (09/25 2030) Resp:  [11-24] 14 (09/26 0800) BP: (87-112)/(64-72) 87/65 (09/26 0800) SpO2:  [97 %-100 %] 97 % (09/26 0800) Arterial Line BP: (100-137)/(52-76) 124/61 (09/26 0600) Weight:  [76.8 kg] 76.8 kg (09/26 0500)  Filed Weights   09/19/18 0604 09/20/18 0500  Weight: 70.8 kg 76.8 kg    Weight change: 6.039 kg   Hemodynamic parameters for last 24 hours: PAP: (13-31)/(6-21) 19/9 CO:  [5.3 L/min-5.8 L/min] 5.3 L/min CI:  [2.9 L/min/m2-3.2 L/min/m2] 2.9 L/min/m2  Intake/Output from previous day: 09/25 0701 - 09/26 0700 In: 4994.5 [I.V.:3477; Blood:250; IV Piggyback:1267.6] Out: 5053 [Urine:3985; Blood:450; Chest Tube:700]  Intake/Output this shift: Total I/O In: -  Out: 225 [Urine:175; Chest Tube:50]  Current Meds: Scheduled Meds: . acetaminophen  1,000 mg Oral Q6H  . aspirin EC  325 mg Oral Daily  . [START ON 09/21/2018] aspirin EC  81 mg Oral Daily  . bisacodyl  10 mg Oral Daily   Or  . bisacodyl  10 mg Rectal Daily  . docusate sodium  200 mg Oral Daily  . [START ON 09/21/2018] enoxaparin (LOVENOX) injection  40 mg Subcutaneous QHS  . furosemide  20 mg Intravenous BID  . [START ON 09/21/2018] furosemide  40 mg Oral Daily  . insulin aspart  0-24 Units Subcutaneous Q4H  . ketorolac  15 mg Intravenous Q6H  . mouth rinse  15 mL Mouth Rinse BID  . metoprolol tartrate  12.5 mg Oral BID  . [START ON  09/21/2018] pantoprazole  40 mg Oral Daily  . [START ON 09/21/2018] potassium chloride  20 mEq Oral Daily  . sodium chloride flush  3 mL Intravenous Q12H  . warfarin  2.5 mg Oral q1800  . Warfarin - Physician Dosing Inpatient   Does not apply q1800   Continuous Infusions: . sodium chloride    . albumin human 12.5 g (09/19/18 1847)  . cefUROXime (ZINACEF)  IV Stopped (09/20/18 0441)  . lactated ringers    . lactated ringers Stopped (09/19/18 1848)  . potassium chloride 10 mEq (09/20/18 0751)   PRN Meds:.albumin human, metoprolol tartrate, morphine injection, ondansetron (ZOFRAN) IV, oxyCODONE, promethazine, sodium chloride flush, traMADol  General appearance: alert, cooperative and no distress Heart: regular rate and rhythm Lungs: clear to auscultation bilaterally Abdomen: soft, non-tender; bowel sounds normal; no masses,  no organomegaly Extremities: edema trace Wound: clean and dry  Lab Results: CBC: Recent Labs    09/19/18 1914 09/19/18 1923 09/20/18 0404  WBC 21.6*  --  14.8*  HGB 9.6* 8.8* 9.5*  HCT 29.0* 26.0* 28.7*  PLT 144*  --  117*   BMET:  Recent Labs    09/17/18 1139  09/19/18 1923 09/20/18 0404  NA 136   < > 139 135  K 3.7   < >  4.4 4.0  CL 104   < > 107 109  CO2 21*  --   --  21*  GLUCOSE 92   < > 122* 116*  BUN 9   < > 5* 5*  CREATININE 0.66   < > 0.40* 0.49  CALCIUM 9.5  --   --  6.7*   < > = values in this interval not displayed.    CMET: Lab Results  Component Value Date   WBC 14.8 (H) 09/20/2018   HGB 9.5 (L) 09/20/2018   HCT 28.7 (L) 09/20/2018   PLT 117 (L) 09/20/2018   GLUCOSE 116 (H) 09/20/2018   CHOL 175 09/12/2008   TRIG 40 09/12/2008   HDL 57.8 09/12/2008   LDLCALC 109 (H) 09/12/2008   ALT 17 09/17/2018   AST 19 09/17/2018   NA 135 09/20/2018   K 4.0 09/20/2018   CL 109 09/20/2018   CREATININE 0.49 09/20/2018   BUN 5 (L) 09/20/2018   CO2 21 (L) 09/20/2018   TSH 1.290 04/30/2018   INR 1.48 09/19/2018   HGBA1C 5.5  09/17/2018      PT/INR:  Recent Labs    09/19/18 1323  LABPROT 17.8*  INR 1.48   Radiology: Dg Chest Port 1 View  Result Date: 09/19/2018 CLINICAL DATA:  Status post mitral valve surgery EXAM: PORTABLE CHEST 1 VIEW COMPARISON:  09/17/2018 FINDINGS: Right chest tube and pericardial drain are noted in satisfactory position. Swan-Ganz catheter is noted in the pulmonary outflow tract. Postsurgical changes consistent with the recent mitral valve surgery are noted. No definitive pneumothorax is seen. Mild atelectatic changes are noted in the right apex. IMPRESSION: Tubes and lines as described above. Mild right apical atelectasis.  No definitive pneumothorax is seen. Electronically Signed   By: Inez Catalina M.D.   On: 09/19/2018 13:55     Assessment/Plan: S/P Procedure(s) (LRB): MINIMALLY INVASIVE MITRAL VALVE REPAIR (MVR). 34mm MEMO 4D RING. (Right) TRANSESOPHAGEAL ECHOCARDIOGRAM (TEE) (N/A)  1. CV- NSR with PVCs under pacemaker-off Neo, Lopressor 12.5 mg BID ordered 2. Pulm- no air leak, CXR without effusions, pneumothorax, leave chest tubes in place 3. Renal- creatinine, weight is elevated, will start gentle diuresis today 4. GI- persistent nausea with emesis, despite Zofran, Reglan, will add Phenergen  5. Expected post operative blood loss anemia, mild at 9.5 6. Expected post operative thrombocytopenia,  Mild at 117 monitor 7. Dispo- patient stable, NSR with PVCs under pacer, biggest issues is nausea, will add phenergan, leave chest tubes in place, POD #1 progression orders 6.      Kara Owens 09/20/2018 8:39 AM    Chart reviewed, patient examined, agree with above. BP has been low normal in the 90's all day so will hold Lopressor for now. Diuresing well.

## 2018-09-21 ENCOUNTER — Inpatient Hospital Stay (HOSPITAL_COMMUNITY): Payer: 59

## 2018-09-21 LAB — GLUCOSE, CAPILLARY: Glucose-Capillary: 107 mg/dL — ABNORMAL HIGH (ref 70–99)

## 2018-09-21 LAB — CBC
HEMATOCRIT: 28.5 % — AB (ref 36.0–46.0)
Hemoglobin: 9.2 g/dL — ABNORMAL LOW (ref 12.0–15.0)
MCH: 31.4 pg (ref 26.0–34.0)
MCHC: 32.3 g/dL (ref 30.0–36.0)
MCV: 97.3 fL (ref 78.0–100.0)
Platelets: 93 10*3/uL — ABNORMAL LOW (ref 150–400)
RBC: 2.93 MIL/uL — ABNORMAL LOW (ref 3.87–5.11)
RDW: 13.7 % (ref 11.5–15.5)
WBC: 15.3 10*3/uL — ABNORMAL HIGH (ref 4.0–10.5)

## 2018-09-21 LAB — BASIC METABOLIC PANEL
Anion gap: 6 (ref 5–15)
BUN: 7 mg/dL (ref 6–20)
CALCIUM: 7.8 mg/dL — AB (ref 8.9–10.3)
CO2: 25 mmol/L (ref 22–32)
Chloride: 106 mmol/L (ref 98–111)
Creatinine, Ser: 0.59 mg/dL (ref 0.44–1.00)
GFR calc Af Amer: >60 mL/min (ref >60)
GLUCOSE: 123 mg/dL — AB (ref 70–99)
Potassium: 3.6 mmol/L (ref 3.5–5.1)
Sodium: 137 mmol/L (ref 135–145)

## 2018-09-21 LAB — PROTIME-INR
INR: 1.23
Prothrombin Time: 15.4 seconds — ABNORMAL HIGH (ref 11.4–15.2)

## 2018-09-21 MED ORDER — POTASSIUM CHLORIDE 10 MEQ/100ML IV SOLN
10.0000 meq | INTRAVENOUS | Status: AC
  Administered 2018-09-21 (×3): 10 meq via INTRAVENOUS
  Filled 2018-09-21 (×3): qty 100

## 2018-09-21 MED ORDER — POTASSIUM CHLORIDE 10 MEQ/50ML IV SOLN: 10.0000 meq | INTRAVENOUS | Status: DC

## 2018-09-21 MED ORDER — POTASSIUM CHLORIDE CRYS ER 20 MEQ PO TBCR
20.0000 meq | EXTENDED_RELEASE_TABLET | Freq: Every day | ORAL | Status: AC
  Administered 2018-09-22 – 2018-09-23 (×2): 20 meq via ORAL
  Filled 2018-09-21 (×2): qty 1

## 2018-09-21 MED ORDER — COUMADIN BOOK
Freq: Once | Status: AC
  Administered 2018-09-21: 11:00:00
  Filled 2018-09-21: qty 1

## 2018-09-21 MED ORDER — POLYETHYLENE GLYCOL 3350 17 G PO PACK
17.0000 g | PACK | Freq: Every day | ORAL | Status: DC | PRN
  Administered 2018-09-23: 17 g via ORAL
  Filled 2018-09-21: qty 1

## 2018-09-21 MED FILL — Sodium Chloride IV Soln 0.9%: INTRAVENOUS | Qty: 2000 | Status: AC

## 2018-09-21 MED FILL — Sodium Bicarbonate IV Soln 8.4%: INTRAVENOUS | Qty: 50 | Status: AC

## 2018-09-21 MED FILL — Mannitol IV Soln 20%: INTRAVENOUS | Qty: 500 | Status: AC

## 2018-09-21 MED FILL — Electrolyte-R (PH 7.4) Solution: INTRAVENOUS | Qty: 4000 | Status: AC

## 2018-09-21 MED FILL — Lidocaine HCl(Cardiac) IV PF Soln Pref Syr 100 MG/5ML (2%): INTRAVENOUS | Qty: 25 | Status: AC

## 2018-09-21 NOTE — Plan of Care (Signed)
  Problem: Activity: Goal: Risk for activity intolerance will decrease Outcome: Progressing   Problem: Cardiac: Goal: Will achieve and/or maintain hemodynamic stability Outcome: Progressing   Problem: Clinical Measurements: Goal: Postoperative complications will be avoided or minimized Outcome: Progressing   Problem: Respiratory: Goal: Respiratory status will improve Outcome: Progressing   Problem: Urinary Elimination: Goal: Ability to achieve and maintain adequate renal perfusion and functioning will improve Outcome: Progressing   Problem: Clinical Measurements: Goal: Ability to maintain clinical measurements within normal limits will improve Outcome: Progressing   Problem: Activity: Goal: Risk for activity intolerance will decrease Outcome: Progressing   Problem: Elimination: Goal: Will not experience complications related to urinary retention Outcome: Progressing   Problem: Pain Managment: Goal: General experience of comfort will improve Outcome: Progressing

## 2018-09-21 NOTE — Progress Notes (Signed)
2 Days Post-Op Procedure(s) (LRB): MINIMALLY INVASIVE MITRAL VALVE REPAIR (MVR). 67mm MEMO 4D RING. (Right) TRANSESOPHAGEAL ECHOCARDIOGRAM (TEE) (N/A) Subjective: C/o nausea, denies pain  Objective: Vital signs in last 24 hours: Temp:  [98.1 F (36.7 C)-98.4 F (36.9 C)] 98.4 F (36.9 C) (09/27 0431) Pulse Rate:  [79-92] 91 (09/27 0712) Cardiac Rhythm: Normal sinus rhythm (09/26 2000) Resp:  [8-24] 22 (09/27 0712) BP: (81-104)/(50-74) 104/66 (09/27 0712) SpO2:  [94 %-99 %] 99 % (09/27 0712) Weight:  [73.7 kg] 73.7 kg (09/27 0600)  Hemodynamic parameters for last 24 hours:    Intake/Output from previous day: 09/26 0701 - 09/27 0700 In: 1230.8 [P.O.:780; I.V.:118.5; IV Piggyback:332.3] Out: 2231 [Urine:1860; Stool:1; Chest Tube:370] Intake/Output this shift: Total I/O In: 59.8 [I.V.:4.5; IV Piggyback:55.3] Out: 0   General appearance: alert, cooperative and no distress Neurologic: intact Heart: regular rate and rhythm Lungs: clear to auscultation bilaterally Abdomen: normal findings: soft, non-tender  Lab Results: Recent Labs    09/20/18 1653 09/21/18 0259  WBC 15.5* 15.3*  HGB 9.4* 9.2*  HCT 29.9* 28.5*  PLT 112* 93*   BMET:  Recent Labs    09/20/18 0404 09/20/18 1653 09/21/18 0259  NA 135  --  137  K 4.0  --  3.6  CL 109  --  106  CO2 21*  --  25  GLUCOSE 116*  --  123*  BUN 5*  --  7  CREATININE 0.49 0.60 0.59  CALCIUM 6.7*  --  7.8*    PT/INR:  Recent Labs    09/21/18 0259  LABPROT 15.4*  INR 1.23   ABG    Component Value Date/Time   PHART 7.308 (L) 09/19/2018 1521   HCO3 22.8 09/19/2018 1521   TCO2 24 09/19/2018 1923   ACIDBASEDEF 3.0 (H) 09/19/2018 1521   O2SAT 98.0 09/19/2018 1521   CBG (last 3)  Recent Labs    09/20/18 2017 09/20/18 2335 09/21/18 0415  GLUCAP 125* 103* 107*    Assessment/Plan: S/P Procedure(s) (LRB): MINIMALLY INVASIVE MITRAL VALVE REPAIR (MVR). 30mm MEMO 4D RING. (Right) TRANSESOPHAGEAL ECHOCARDIOGRAM  (TEE) (N/A) Plan for transfer to step-down: see transfer orders  CV- stable in SR, low dose warfarin started  RESP- continue IS, keep CT in place today  RENAL-creatinine OK, supplement K  ENDO- CBG well controlled  Thrombocytopenia- PLT down to 93 K- will stop lovenox, follow  Ambulate   LOS: 2 days    Kara Owens 09/21/2018

## 2018-09-21 NOTE — Discharge Instructions (Signed)
Information on my medicine - Coumadin   (Warfarin)  Why was Coumadin prescribed for you? Coumadin was prescribed for you because you have a blood clot or a medical condition that can cause an increased risk of forming blood clots. Blood clots can cause serious health problems by blocking the flow of blood to the heart, lung, or brain. Coumadin can prevent harmful blood clots from forming. As a reminder your indication for Coumadin is:   Blood Clot Prevention After Heart Valve Surgery  What test will check on my response to Coumadin? While on Coumadin (warfarin) you will need to have an INR test regularly to ensure that your dose is keeping you in the desired range. The INR (international normalized ratio) number is calculated from the result of the laboratory test called prothrombin time (PT).  If an INR APPOINTMENT HAS NOT ALREADY BEEN MADE FOR YOU please schedule an appointment to have this lab work done by your health care provider within 7 days. Your INR goal is usually a number between:  2 to 3 or your provider may give you a more narrow range like 2-2.5.  Ask your health care provider during an office visit what your goal INR is.  What  do you need to  know  About  COUMADIN? Take Coumadin (warfarin) exactly as prescribed by your healthcare provider about the same time each day.  DO NOT stop taking without talking to the doctor who prescribed the medication.  Stopping without other blood clot prevention medication to take the place of Coumadin may increase your risk of developing a new clot or stroke.  Get refills before you run out.  What do you do if you miss a dose? If you miss a dose, take it as soon as you remember on the same day then continue your regularly scheduled regimen the next day.  Do not take two doses of Coumadin at the same time.  Important Safety Information A possible side effect of Coumadin (Warfarin) is an increased risk of bleeding. You should call your healthcare  provider right away if you experience any of the following: ? Bleeding from an injury or your nose that does not stop. ? Unusual colored urine (red or dark brown) or unusual colored stools (red or black). ? Unusual bruising for unknown reasons. ? A serious fall or if you hit your head (even if there is no bleeding).  Some foods or medicines interact with Coumadin (warfarin) and might alter your response to warfarin. To help avoid this: ? Eat a balanced diet, maintaining a consistent amount of Vitamin K. ? Notify your provider about major diet changes you plan to make. ? Avoid alcohol or limit your intake to 1 drink for women and 2 drinks for men per day. (1 drink is 5 oz. wine, 12 oz. beer, or 1.5 oz. liquor.)  Make sure that ANY health care provider who prescribes medication for you knows that you are taking Coumadin (warfarin).  Also make sure the healthcare provider who is monitoring your Coumadin knows when you have started a new medication including herbals and non-prescription products.  Coumadin (Warfarin)  Major Drug Interactions  Increased Warfarin Effect Decreased Warfarin Effect  Alcohol (large quantities) Antibiotics (esp. Septra/Bactrim, Flagyl, Cipro) Amiodarone (Cordarone) Aspirin (ASA) Cimetidine (Tagamet) Megestrol (Megace) NSAIDs (ibuprofen, naproxen, etc.) Piroxicam (Feldene) Propafenone (Rythmol SR) Propranolol (Inderal) Isoniazid (INH) Posaconazole (Noxafil) Barbiturates (Phenobarbital) Carbamazepine (Tegretol) Chlordiazepoxide (Librium) Cholestyramine (Questran) Griseofulvin Oral Contraceptives Rifampin Sucralfate (Carafate) Vitamin K   Coumadin (Warfarin) Major  Herbal Interactions  Increased Warfarin Effect Decreased Warfarin Effect  Garlic Ginseng Ginkgo biloba Coenzyme Q10 Green tea St. Johns wort    Coumadin (Warfarin) FOOD Interactions  Eat a consistent number of servings per week of foods HIGH in Vitamin K (1 serving =  cup)  Collards  (cooked, or boiled & drained) Kale (cooked, or boiled & drained) Mustard greens (cooked, or boiled & drained) Parsley *serving size only =  cup Spinach (cooked, or boiled & drained) Swiss chard (cooked, or boiled & drained) Turnip greens (cooked, or boiled & drained)  Eat a consistent number of servings per week of foods MEDIUM-HIGH in Vitamin K (1 serving = 1 cup)  Asparagus (cooked, or boiled & drained) Broccoli (cooked, boiled & drained, or raw & chopped) Brussel sprouts (cooked, or boiled & drained) *serving size only =  cup Lettuce, raw (green leaf, endive, romaine) Spinach, raw Turnip greens, raw & chopped   These websites have more information on Coumadin (warfarin):  FailFactory.se; VeganReport.com.au;     Discharge Instructions:  1. You may shower, please wash incisions daily with soap and water and keep dry.  If you wish to cover wounds with dressing you may do so but please keep clean and change daily.  No tub baths or swimming until incisions have completely healed.  If your incisions become red or develop any drainage please call our office at [phone]  2. No Driving until cleared by Dr. Guy Sandifer office and you are no longer using narcotic pain medications  3. Monitor your weight daily.. Please use the same scale and weigh at same time... If you gain 5-10 lbs in 48 hours with associated lower extremity swelling, please contact our office at [phone]  4. Fever of 101.5 for at least 24 hours with no source, please contact our office at [phone]  5. Activity- up as tolerated, please walk at least 3 times per day.  Avoid strenuous activity, no lifting, pushing, or pulling with your arms over 8-10 lbs for a minimum of 6 weeks  6. If any questions or concerns arise, please do not hesitate to contact our office at [phone]

## 2018-09-22 ENCOUNTER — Inpatient Hospital Stay (HOSPITAL_COMMUNITY): Payer: 59

## 2018-09-22 LAB — PROTIME-INR
INR: 1.29
Prothrombin Time: 16 seconds — ABNORMAL HIGH (ref 11.4–15.2)

## 2018-09-22 LAB — CBC
HCT: 27.2 % — ABNORMAL LOW (ref 36.0–46.0)
HEMOGLOBIN: 8.8 g/dL — AB (ref 12.0–15.0)
MCH: 31.3 pg (ref 26.0–34.0)
MCHC: 32.4 g/dL (ref 30.0–36.0)
MCV: 96.8 fL (ref 78.0–100.0)
Platelets: 109 10*3/uL — ABNORMAL LOW (ref 150–400)
RBC: 2.81 MIL/uL — ABNORMAL LOW (ref 3.87–5.11)
RDW: 13.6 % (ref 11.5–15.5)
WBC: 10.4 10*3/uL (ref 4.0–10.5)

## 2018-09-22 LAB — BASIC METABOLIC PANEL
ANION GAP: 7 (ref 5–15)
BUN: 6 mg/dL (ref 6–20)
CHLORIDE: 105 mmol/L (ref 98–111)
CO2: 25 mmol/L (ref 22–32)
Calcium: 8.3 mg/dL — ABNORMAL LOW (ref 8.9–10.3)
Creatinine, Ser: 0.52 mg/dL (ref 0.44–1.00)
GFR calc Af Amer: >60 mL/min (ref >60)
GLUCOSE: 101 mg/dL — AB (ref 70–99)
POTASSIUM: 3.6 mmol/L (ref 3.5–5.1)
Sodium: 137 mmol/L (ref 135–145)

## 2018-09-22 NOTE — Progress Notes (Signed)
Dc'ed chest tubes pt tolerated well

## 2018-09-22 NOTE — Progress Notes (Signed)
CARDIAC REHAB PHASE I   PRE:  Rate/Rhythm: 124 ST with PVCs  Pt helped out of bathroom, and helped pt clean up. Completed d/c ed with pt and family. Pt given cardiac surgery booklet. Reviewed restrictions and exercise guidelines. Encouraged continued IS use and walks. No further questions by pt or family.  Will send CRP II referral to [place].  [place], RN BSN 09/22/2018 1:12 PM

## 2018-09-22 NOTE — Progress Notes (Addendum)
      [address]       [place] [address]             [phone]      3 Days Post-Op Procedure(s) (LRB): MINIMALLY INVASIVE MITRAL VALVE REPAIR (MVR). 30mm MEMO 4D RING. (Right) TRANSESOPHAGEAL ECHOCARDIOGRAM (TEE) (N/A) Subjective: Nausea has improved today. She wants to get her chest tubes out so she can move around better.   Objective: Vital signs in last 24 hours: Temp:  [98.3 F (36.8 C)-98.7 F (37.1 C)] 98.3 F (36.8 C) (09/28 0415) Pulse Rate:  [88-96] 96 (09/28 0003) Cardiac Rhythm: Normal sinus rhythm (09/28 0700) Resp:  [16-22] 17 (09/28 0415) BP: (108-117)/(69-82) 117/82 (09/28 0415) SpO2:  [97 %-99 %] 97 % (09/28 0415) Weight:  [72.3 kg] 72.3 kg (09/28 0527)     Intake/Output from previous day: 09/27 0701 - 09/28 0700 In: 378.7 [P.O.:120; I.V.:7.2; IV Piggyback:251.5] Out: 291 [Urine:101; Chest Tube:190] Intake/Output this shift: No intake/output data recorded.  General appearance: alert, cooperative and no distress Heart: regular rate and rhythm, S1, S2 normal, no murmur, click, rub or gallop Lungs: clear to auscultation bilaterally Abdomen: soft, non-tender; bowel sounds normal; no masses,  no organomegaly Extremities: extremities normal, atraumatic, no cyanosis or edema Wound: clean and dry covered with sterile dressing  Lab Results: Recent Labs    09/21/18 0259 09/22/18 0508  WBC 15.3* 10.4  HGB 9.2* 8.8*  HCT 28.5* 27.2*  PLT 93* 109*   BMET:  Recent Labs    09/21/18 0259 09/22/18 0508  NA 137 137  K 3.6 3.6  CL 106 105  CO2 25 25  GLUCOSE 123* 101*  BUN 7 6  CREATININE 0.59 0.52  CALCIUM 7.8* 8.3*    PT/INR:  Recent Labs    09/22/18 0508  LABPROT 16.0*  INR 1.29   ABG    Component Value Date/Time   PHART 7.308 (L) 09/19/2018 1521   HCO3 22.8 09/19/2018 1521   TCO2 24 09/19/2018 1923   ACIDBASEDEF 3.0 (H) 09/19/2018 1521   O2SAT 98.0 09/19/2018 1521   CBG (last 3)  Recent Labs    09/20/18 2017  09/20/18 2335 09/21/18 0415  GLUCAP 125* 103* 107*    Assessment/Plan: S/P Procedure(s) (LRB): MINIMALLY INVASIVE MITRAL VALVE REPAIR (MVR). 24mm MEMO 4D RING. (Right) TRANSESOPHAGEAL ECHOCARDIOGRAM (TEE) (N/A)  1. CV-NSR in the 90s, BP well controlled. Tolerating ASA, holding metoprolol due to intermittentl low BP. Coumadin started- INR 1.29 2. Pulm-tolerating room air with good oxygen saturation. CT output 168ml/24 hours. CXR this morning is stable. May be a small right apical pneumothorax. Await official read. Intermittent air leak on exam with cough.   3. Renal-creatinine 0.52, electrolytes okay. Continue Lasix 40mg  daily.  4. H and H 8.8/27.2, expected acute blood loss anemia, thrombocytopenia-lovenox stopped  5. Endo-blood glucose well controlled 6. Nausea-much better today. Continue Reglan. Continue Phenergan PRN.   Plan: May be able to remove chest tubes today-await official chest xray read. D/C On-Q. Discontinue EPW-rhythm has been stable. Ambulate in the halls. Encouraged incentive spirometer use.     LOS: 3 days    Elgie Collard 09/22/2018 Patient seen and examined, agree with above. Nausea improved I do not see any air leak Dc chest tubes and On-Q(bulb is empty)  Remo Lipps C. Roxan Hockey, MD [facility] [phone]

## 2018-09-22 NOTE — Sepsis Progress Note (Signed)
dc'ed pacing wires pt. tolerated well 

## 2018-09-23 ENCOUNTER — Inpatient Hospital Stay (HOSPITAL_COMMUNITY): Payer: 59

## 2018-09-23 LAB — PROTIME-INR
INR: 1.48
Prothrombin Time: 17.8 seconds — ABNORMAL HIGH (ref 11.4–15.2)

## 2018-09-23 MED ORDER — AMIODARONE HCL 200 MG PO TABS
400.0000 mg | ORAL_TABLET | Freq: Two times a day (BID) | ORAL | Status: DC
  Administered 2018-09-23 – 2018-09-24 (×3): 400 mg via ORAL
  Filled 2018-09-23 (×3): qty 2

## 2018-09-23 MED ORDER — AMIODARONE LOAD VIA INFUSION
150.0000 mg | Freq: Once | INTRAVENOUS | Status: AC
  Administered 2018-09-23: 150 mg via INTRAVENOUS
  Filled 2018-09-23: qty 83.34

## 2018-09-23 MED ORDER — AMIODARONE HCL 200 MG PO TABS: 400.0000 mg | ORAL_TABLET | Freq: Two times a day (BID) | ORAL | Status: DC

## 2018-09-23 MED ORDER — AMIODARONE HCL IN DEXTROSE 360-4.14 MG/200ML-% IV SOLN
30.0000 mg/h | INTRAVENOUS | Status: DC
  Filled 2018-09-23: qty 200

## 2018-09-23 MED ORDER — METOPROLOL TARTRATE 12.5 MG HALF TABLET
12.5000 mg | ORAL_TABLET | Freq: Two times a day (BID) | ORAL | Status: DC
  Administered 2018-09-23 – 2018-09-24 (×3): 12.5 mg via ORAL
  Filled 2018-09-23 (×3): qty 1

## 2018-09-23 MED ORDER — ONDANSETRON 4 MG PO TBDP
4.0000 mg | ORAL_TABLET | Freq: Four times a day (QID) | ORAL | Status: DC | PRN
  Administered 2018-09-23: 4 mg via ORAL
  Filled 2018-09-23: qty 1

## 2018-09-23 MED ORDER — AMIODARONE HCL IN DEXTROSE 360-4.14 MG/200ML-% IV SOLN
60.0000 mg/h | INTRAVENOUS | Status: DC
  Administered 2018-09-23: 60 mg/h via INTRAVENOUS
  Filled 2018-09-23: qty 200

## 2018-09-23 NOTE — Progress Notes (Addendum)
      [address]       [place] [address]             [phone]      4 Days Post-Op Procedure(s) (LRB): MINIMALLY INVASIVE MITRAL VALVE REPAIR (MVR). 64mm MEMO 4D RING. (Right) TRANSESOPHAGEAL ECHOCARDIOGRAM (TEE) (N/A) Subjective: She is feeling okay this morning. She just has a small amount of incisional pain  Objective: Vital signs in last 24 hours: Temp:  [98.5 F (36.9 C)-99.4 F (37.4 C)] 98.6 F (37 C) (09/29 0511) Pulse Rate:  [99-107] 99 (09/29 0511) Cardiac Rhythm: Atrial fibrillation (09/29 0700) Resp:  [15-22] 18 (09/29 0511) BP: (93-151)/(65-94) 112/74 (09/29 0511) SpO2:  [95 %-99 %] 99 % (09/29 0511) Weight:  [69.8 kg] 69.8 kg (09/29 0511)    Intake/Output from previous day: 09/28 0701 - 09/29 0700 In: 2 [P.O.:840; I.V.:7] Out: 2600 [Urine:400; Chest Tube:2200] Intake/Output this shift: Total I/O In: 14.7 [I.V.:14.7] Out: -   General appearance: alert, cooperative and no distress Heart: sinus tachycardia Lungs: clear to auscultation bilaterally Abdomen: soft, non-tender; bowel sounds normal; no masses,  no organomegaly Extremities: extremities normal, atraumatic, no cyanosis or edema Wound: clean and dry  Lab Results: Recent Labs    09/21/18 0259 09/22/18 0508  WBC 15.3* 10.4  HGB 9.2* 8.8*  HCT 28.5* 27.2*  PLT 93* 109*   BMET:  Recent Labs    09/21/18 0259 09/22/18 0508  NA 137 137  K 3.6 3.6  CL 106 105  CO2 25 25  GLUCOSE 123* 101*  BUN 7 6  CREATININE 0.59 0.52  CALCIUM 7.8* 8.3*    PT/INR:  Recent Labs    09/23/18 0304  LABPROT 17.8*  INR 1.48   ABG    Component Value Date/Time   PHART 7.308 (L) 09/19/2018 1521   HCO3 22.8 09/19/2018 1521   TCO2 24 09/19/2018 1923   ACIDBASEDEF 3.0 (H) 09/19/2018 1521   O2SAT 98.0 09/19/2018 1521   CBG (last 3)  Recent Labs    09/20/18 2017 09/20/18 2335 09/21/18 0415  GLUCAP 125* 103* 107*    Assessment/Plan: S/P Procedure(s) (LRB): MINIMALLY  INVASIVE MITRAL VALVE REPAIR (MVR). 67mm MEMO 4D RING. (Right) TRANSESOPHAGEAL ECHOCARDIOGRAM (TEE) (N/A)  1. CV-afib with RVR this morning around 7am. She converted within 20 minutes of treatment with Amiodarone IV. I will change her to oral Amio 400mg  BID. Continue Metoprolol-will increase as BP allows.  2. Pulm-tolerating room air with good oxygen saturation. Chest tubes removed. Charting for chest tube drainage is inaccurate. Small right pneumo-slightly larger than yesterdays study. Encourage incentive spirometer. Will order another f/u CXR tomorrow.  3. Renal-creatinine 0.52, electrolytes okay.  4. H and H 8.8/27.2, expected acute blood loss anemia. 5. Continue coumadin-INR 1.48 this morning. 6. Endo-blood glucose well controlled.   Plan: Change Amio to oral. Continue incentive spirometer. Continue ambulation in the halls. If she remains in rhythm for 24 hours then she can be discharged tomorrow. CXR in the AM.     LOS: 4 days    Kara Owens 09/23/2018 Patient seen and examined, agree with above  Remo Lipps C. Roxan Hockey, MD [facility] [phone]

## 2018-09-24 ENCOUNTER — Inpatient Hospital Stay (HOSPITAL_COMMUNITY): Payer: 59

## 2018-09-24 LAB — PROTIME-INR
INR: 1.4
PROTHROMBIN TIME: 17 s — AB (ref 11.4–15.2)

## 2018-09-24 MED ORDER — METOPROLOL TARTRATE 25 MG PO TABS: 12.5000 mg | ORAL_TABLET | Freq: Two times a day (BID) | ORAL | Status: DC

## 2018-09-24 MED ORDER — AMIODARONE HCL 200 MG PO TABS: 400.0000 mg | ORAL_TABLET | Freq: Two times a day (BID) | ORAL | 1 refills | Status: DC

## 2018-09-24 MED ORDER — OXYCODONE HCL 10 MG PO TABS: 10.0000 mg | ORAL_TABLET | ORAL | 0 refills | Status: DC | PRN

## 2018-09-24 MED ORDER — ASPIRIN 81 MG PO TBEC: 81.0000 mg | DELAYED_RELEASE_TABLET | Freq: Every day | ORAL | Status: AC

## 2018-09-24 MED ORDER — PROMETHAZINE HCL 12.5 MG PO TABS: 12.5000 mg | ORAL_TABLET | Freq: Four times a day (QID) | ORAL | 0 refills | Status: DC | PRN

## 2018-09-24 MED ORDER — WARFARIN SODIUM 4 MG PO TABS: 4.0000 mg | ORAL_TABLET | Freq: Every day | ORAL | 3 refills | Status: DC

## 2018-09-24 MED FILL — Magnesium Sulfate Inj 50%: INTRAMUSCULAR | Qty: 10 | Status: AC

## 2018-09-24 MED FILL — Potassium Chloride Inj 2 mEq/ML: INTRAVENOUS | Qty: 40 | Status: AC

## 2018-09-24 MED FILL — Heparin Sodium (Porcine) Inj 1000 Unit/ML: INTRAMUSCULAR | Qty: 30 | Status: AC

## 2018-09-24 NOTE — Progress Notes (Signed)
Order received to discharge patient.  Telemetry monitor removed and CCMD notified.  PIV access removed.  Discharge instructions, follow up, medications and instructions for their use discussed with patient. 

## 2018-09-24 NOTE — Progress Notes (Signed)
CARDIAC REHAB PHASE I   Reviewed d/c ed with pt. Pt states no questions or concerns at this time. Pt referred to CRP II [place]. Waiting on MD so she can d/c.   [id number] Rufina Falco, RN BSN 09/24/2018 8:34 AM

## 2018-09-24 NOTE — Progress Notes (Addendum)
      [address]       [name] [address]             [phone]      5 Days Post-Op Procedure(s) (LRB): MINIMALLY INVASIVE MITRAL VALVE REPAIR (MVR). 50mm MEMO 4D RING. (Right) TRANSESOPHAGEAL ECHOCARDIOGRAM (TEE) (N/A)   Subjective:  Patient feeling much better.  She did not sleep well last night.  Her nausea has resolved.  + ambulation  + BM  Objective: Vital signs in last 24 hours: Temp:  [98.2 F (36.8 C)-99.5 F (37.5 C)] 98.2 F (36.8 C) (09/30 0442) Pulse Rate:  [88-100] 88 (09/30 0442) Cardiac Rhythm: Sinus tachycardia (09/29 2015) Resp:  [17-30] 26 (09/30 0442) BP: (94-113)/(66-78) 113/78 (09/30 0442) SpO2:  [98 %-99 %] 99 % (09/30 0442) Weight:  [69.3 kg] 69.3 kg (09/30 0442)  Intake/Output from previous day: 09/29 0701 - 09/30 0700 In: 1214.7 [P.O.:1200; I.V.:14.7] Out: 1951 [RPRXY:5859; Stool:1]  General appearance: alert, cooperative and no distress Heart: regular rate and rhythm Lungs: clear to auscultation bilaterally Abdomen: soft, non-tender; bowel sounds normal; no masses,  no organomegaly Extremities: edema trace Wound: clean and dry  Lab Results: Recent Labs    09/22/18 0508  WBC 10.4  HGB 8.8*  HCT 27.2*  PLT 109*   BMET:  Recent Labs    09/22/18 0508  NA 137  K 3.6  CL 105  CO2 25  GLUCOSE 101*  BUN 6  CREATININE 0.52  CALCIUM 8.3*    PT/INR:  Recent Labs    09/24/18 0252  LABPROT 17.0*  INR 1.40   ABG    Component Value Date/Time   PHART 7.308 (L) 09/19/2018 1521   HCO3 22.8 09/19/2018 1521   TCO2 24 09/19/2018 1923   ACIDBASEDEF 3.0 (H) 09/19/2018 1521   O2SAT 98.0 09/19/2018 1521   CBG (last 3)  No results for input(s): GLUCAP in the last 72 hours.  Assessment/Plan: S/P Procedure(s) (LRB): MINIMALLY INVASIVE MITRAL VALVE REPAIR (MVR). 71mm MEMO 4D RING. (Right) TRANSESOPHAGEAL ECHOCARDIOGRAM (TEE) (N/A)  1. CV- PAF, currently in NSR, Lopressor, Amiodarone 2. INR 1.40, dropped from  1.48 yesterday, will increase coumadin to 4 mg daily 3. Pulm- no acute issues, continue IS 4. Renal- creatinine has been stable, weight is below admission weight, has completed Lasix regimen 5. Dispo- patient stable, will d/c home today   LOS: 5 days    Erin Barrett 09/24/2018  I have seen and examined the patient and agree with the assessment and plan as outlined.  D/C home today  [name], MD 09/24/2018 8:55 AM

## 2018-09-28 ENCOUNTER — Ambulatory Visit (INDEPENDENT_AMBULATORY_CARE_PROVIDER_SITE_OTHER): Payer: 59 | Admitting: *Deleted

## 2018-09-28 DIAGNOSIS — I4891 Unspecified atrial fibrillation: Secondary | ICD-10-CM | POA: Insufficient documentation

## 2018-09-28 DIAGNOSIS — Z9889 Other specified postprocedural states: Secondary | ICD-10-CM

## 2018-09-28 DIAGNOSIS — Z5181 Encounter for therapeutic drug level monitoring: Secondary | ICD-10-CM | POA: Insufficient documentation

## 2018-09-28 LAB — POCT INR: INR: 3.4 — AB (ref 2.0–3.0)

## 2018-09-28 NOTE — Patient Instructions (Addendum)
Description   Skip today's dose, then start taking 1 tablet daily (4mg ) daily except 1/2 tablet on Sundays, Tuesdays and Thursdays. Recheck in one week. Call us with any medication changes or concerns to [facility] [phone], Main # [phone].     A full discussion of the nature of anticoagulants has been carried out.  A benefit risk analysis has been presented to the patient, so that they understand the justification for choosing anticoagulation at this time. The need for frequent and regular monitoring, precise dosage adjustment and compliance is stressed.  Side effects of potential bleeding are discussed.  The patient should avoid any OTC items containing aspirin or ibuprofen, and should avoid great swings in general diet.  Avoid alcohol consumption.  Call if any signs of abnormal bleeding.

## 2018-10-01 ENCOUNTER — Encounter: Payer: Self-pay | Admitting: Cardiovascular Disease

## 2018-10-05 ENCOUNTER — Ambulatory Visit (INDEPENDENT_AMBULATORY_CARE_PROVIDER_SITE_OTHER): Payer: 59 | Admitting: *Deleted

## 2018-10-05 DIAGNOSIS — Z9889 Other specified postprocedural states: Secondary | ICD-10-CM | POA: Diagnosis not present

## 2018-10-05 DIAGNOSIS — Z5181 Encounter for therapeutic drug level monitoring: Secondary | ICD-10-CM | POA: Diagnosis not present

## 2018-10-05 DIAGNOSIS — I4891 Unspecified atrial fibrillation: Secondary | ICD-10-CM | POA: Diagnosis not present

## 2018-10-05 LAB — POCT INR: INR: 2.5 (ref 2.0–3.0)

## 2018-10-05 NOTE — Patient Instructions (Signed)
Description   Continue  taking 1 tablet daily (4mg ) daily except 1/2 tablet on Sundays, Tuesdays and Thursdays. Recheck in one week. Call us with any medication changes or concerns to [facility] [phone], Main # [phone].

## 2018-10-08 ENCOUNTER — Ambulatory Visit
Admission: RE | Admit: 2018-10-08 | Discharge: 2018-10-08 | Disposition: A | Discharge status: Home / Self Care | Payer: 59 | Source: Ambulatory Visit | Attending: Thoracic Surgery (Cardiothoracic Vascular Surgery) | Admitting: Thoracic Surgery (Cardiothoracic Vascular Surgery)

## 2018-10-08 ENCOUNTER — Other Ambulatory Visit: Payer: Self-pay | Admitting: *Deleted

## 2018-10-08 ENCOUNTER — Ambulatory Visit (INDEPENDENT_AMBULATORY_CARE_PROVIDER_SITE_OTHER): Payer: Self-pay | Admitting: Physician Assistant

## 2018-10-08 VITALS — BP 104/64 | HR 100 | Resp 20 | Ht 68.0 in | Wt 156.0 lb

## 2018-10-08 DIAGNOSIS — Z9889 Other specified postprocedural states: Secondary | ICD-10-CM

## 2018-10-08 DIAGNOSIS — R0609 Other forms of dyspnea: Secondary | ICD-10-CM | POA: Diagnosis not present

## 2018-10-08 NOTE — Patient Instructions (Signed)
You may return to driving an automobile as long as you are no longer requiring oral narcotic pain relievers during the daytime.  It would be wise to start driving only short distances during the daylight and gradually increase from there as you feel comfortable.   Endocarditis is a potentially serious infection of heart valves or inside lining of the heart.  It occurs more commonly in patients with diseased heart valves (such as patient's with aortic or mitral valve disease) and in patients who have undergone heart valve repair or replacement.  Certain surgical and dental procedures may put you at risk, such as dental cleaning, other dental procedures, or any surgery involving the respiratory, urinary, gastrointestinal tract, gallbladder or prostate gland.   To minimize your chances for develooping endocarditis, maintain good oral health and seek prompt medical attention for any infections involving the mouth, teeth, gums, skin or urinary tract.    Always notify your doctor or dentist about your underlying heart valve condition before having any invasive procedures. You will need to take antibiotics before certain procedures, including all routine dental cleanings or other dental procedures.  Your cardiologist or dentist should prescribe these antibiotics for you to be taken ahead of time.   You may continue to gradually increase your physical activity as tolerated.  Refrain from any heavy lifting or strenuous use of your arms and shoulders until at least 8 weeks from the time of your surgery, and avoid activities that cause increased pain in your chest on the side of your surgical incision.  Otherwise you may continue to increase activities without any particular limitations.  Increase the intensity and duration of physical activity gradually.  

## 2018-10-08 NOTE — Progress Notes (Signed)
HPI:  Patient returns for routine postoperative follow-up having undergone Minimally Invasive Mitral Valve Repair on 09/19/2018 The patient's early postoperative recovery while in the hospital was notable for Atrial Fibrillation.  She converted to NSR with IV Amiodarone.  Since hospital discharge the patient reports she is doing pretty well.  She continues to have some pain along her incision site.  Her right chest is completely numb.  She also states that she is getting dizziness with standing and taking a few steps.  She is not currently on diuretic therapy.  She is eating and drinking adequately.  She is ambulating without minimal difficulty.  Her incisions are healing without evidence of infection.  Current Outpatient Medications  Medication Sig Dispense Refill  . acetaminophen (TYLENOL) 500 MG tablet Take 1,000 mg by mouth every 6 (six) hours as needed (for pain).    Marland Kitchen amiodarone (PACERONE) 200 MG tablet Take 2 tablets (400 mg total) by mouth 2 (two) times daily. X 7 days, then decrease to 200 mg daily 90 tablet 1  . aspirin EC 81 MG EC tablet Take 1 tablet (81 mg total) by mouth daily.    . metoprolol tartrate (LOPRESSOR) 25 MG tablet Take 0.5 tablets (12.5 mg total) by mouth 2 (two) times daily.    . Multiple Vitamins-Minerals (AIRBORNE) CHEW Chew 1 tablet by mouth 2 (two) times daily as needed (FOR IMMUNE HEALTH).    Marland Kitchen polyethylene glycol (MIRALAX / GLYCOLAX) packet Take 17 g by mouth daily.     Marland Kitchen warfarin (COUMADIN) 4 MG tablet Take 1 tablet (4 mg total) by mouth daily at 6 PM. (Patient taking differently: Take 4 mg by mouth daily at 6 PM. Currently taking 4 mg every other day and alternating with  2 mg every other day) 30 tablet 3   No current facility-administered medications for this visit.     Physical Exam:  BP 104/64   Pulse 100   Resp 20   Ht 5\' 8"  (1.727 m)   Wt 156 lb (70.8 kg)   SpO2 97% Comment: RA  BMI 23.72 kg/m   Gen: no apparent distress Heart: RRR, mild  tachycardia Lungs: CTA bilaterally Incisions well healed  Diagnostic Tests:  CXR; no pleural effusions, pneumothorax  A/P:  1. CV- NSR, + hypotensive- will decrease Lopressor to 12.5 mg daily, if continues to have issues, may be able to try Midodrine.  Will continue Amiodarone 200 mg daily, once bottle is empty, patient can discontinue 2. INR is therapeutic, will continue coumadin for 3 months, will likely be able to discontinue at follow up visit with Dr. Roxy Manns 3. Activity- increase ambulation as tolerated, continue to avoid strenuous activity on right side for several weeks, educated on importance of endocarditis and prophylactic antibiotics prior to dental procedures 4. RTC in 3 months with Dr. Roxy Manns, follow up with Cardiology as scheduled   Ellwood Handler, PA-C [facility] [phone]

## 2018-10-12 ENCOUNTER — Ambulatory Visit (INDEPENDENT_AMBULATORY_CARE_PROVIDER_SITE_OTHER): Payer: 59 | Admitting: *Deleted

## 2018-10-12 DIAGNOSIS — I4891 Unspecified atrial fibrillation: Secondary | ICD-10-CM | POA: Diagnosis not present

## 2018-10-12 DIAGNOSIS — Z5181 Encounter for therapeutic drug level monitoring: Secondary | ICD-10-CM

## 2018-10-12 DIAGNOSIS — Z9889 Other specified postprocedural states: Secondary | ICD-10-CM

## 2018-10-12 LAB — POCT INR: INR: 2.4 (ref 2.0–3.0)

## 2018-10-12 NOTE — Patient Instructions (Signed)
Description   Today take 1.5 tablets then start taking 1 tablet daily daily except 1/2 tablet on Sundays and Thursdays. Recheck in one week. Call us with any medication changes or concerns to [facility] [phone], Main # [phone].

## 2018-10-15 ENCOUNTER — Ambulatory Visit: Payer: 59 | Admitting: Cardiovascular Disease

## 2018-10-15 ENCOUNTER — Encounter: Payer: Self-pay | Admitting: Cardiovascular Disease

## 2018-10-15 VITALS — BP 98/78 | HR 90 | Ht 68.0 in | Wt 158.0 lb

## 2018-10-15 DIAGNOSIS — Z9889 Other specified postprocedural states: Secondary | ICD-10-CM | POA: Diagnosis not present

## 2018-10-15 DIAGNOSIS — I480 Paroxysmal atrial fibrillation: Secondary | ICD-10-CM

## 2018-10-15 MED ORDER — CEPHALEXIN 500 MG PO CAPS: 500.0000 mg | ORAL_CAPSULE | Freq: Two times a day (BID) | ORAL | 0 refills | Status: AC

## 2018-10-15 NOTE — Progress Notes (Signed)
Cardiology Office Note:    Date:  10/15/2018   ID:  Kara Owens, DOB 04-06-1971, MRN [id number]  PCP:  [name], MD  Cardiologist:  [name], MD    Referring MD: [name], MD   Chief Complaint  Patient presents with  . Mitral Valve Prolapse        Kara Owens is a 47 y.o. female with a hx of mitral valve prolapse.  I last saw Kara Owens in 2016.  He has a history of mitral valve prolapse with mild mitral regurgitation.  Mother [name] ) had an ablation last year. Brother had an MI at age 64. ( non smoker, HTN, obese)  Had labs at [name] [facility] office last week.  Had some pleuretic CP last week,    Was given prednisone which helped No cough, cold, no hemoptysis,  No fever, Pain was worse at nihgt.   Trying to exercise  Works in [employer].     Labs from Primary MD   Total chol = 187 Trigs = 70 HDL = 82 LDL = 91   February 23, 2018 Seen with husband  BP was elevated at the TEE  Brought her BP log , all readings were great  Is not exercising regularly   June 08, 2018:  Son is seen back today for follow-up of her mitral valve prolapse.  She has worn an event monitor and was found to have episodes of nonsustained ventricular tachycardia.  We started her on metoprolol 25 mg twice a day which seemed to help with the episodes of nonsustained VT.  We tried increasing the dose to 25 mg 3 times a day but she does  not tolerate this vey well at all due to profound fatigue   She had a stress Myoview study on May 8 which revealed an anterior defect that was thought to be due to breast artifact.    Coronary CT angiogram revealed normal coronary arteries.  She had a coronary calcium score of 0.  Had an episode of chest tightness.   Occurs after the palpitations. Last for a few minutes. Marland Kitchen   Resolves with deep breathing .  July 10, 2018:  Seen with husband , Kara Owens She has a little bit more energy now that she has reduce the metoprolol from 25 mg 3  times daily to 25 mg twice daily.  She still has palpitations He thinks the palpitations may have worsened slightly since reducing the dose of metoprolol.  Oct. 21, 2019  Kara Owens is back - had MV repiar on sept. 25,  Had PAF - is on amio  Past Medical History:  Diagnosis Date  . Chest pain    Left sided--sharp pain  . Complication of anesthesia   . Fatigue    Extreme  . Mitral regurgitation    Mild  . Mitral valve prolapse   . Palpitations   . PONV (postoperative nausea and vomiting)   . S/P minimally invasive mitral valve repair 09/19/2018   Artificial Gore-tex neochord placement x10 with 32 mm Sorin Memo 4D ring annuloplasty via right mini thoracotomy approach    Past Surgical History:  Procedure Laterality Date  . EYE SURGERY Left    retinal repair   . MITRAL VALVE REPAIR Right 09/19/2018   Procedure: MINIMALLY INVASIVE MITRAL VALVE REPAIR (MVR). 74mm MEMO 4D RING.;  Surgeon: [name], MD;  Location: [place];  Service: Open Heart Surgery;  Laterality: Right;  GLUTARALDEHYDE  . TEE WITHOUT CARDIOVERSION  N/A 12/08/2017   Procedure: TRANSESOPHAGEAL ECHOCARDIOGRAM (TEE);  Surgeon: [name], MD;  Location: [place];  Service: Cardiovascular;  Laterality: N/A;  . TEE WITHOUT CARDIOVERSION N/A 07/27/2018   Procedure: TRANSESOPHAGEAL ECHOCARDIOGRAM (TEE);  Surgeon: [name], MD;  Location: [facility] ENDOSCOPY;  Service: Cardiovascular;  Laterality: N/A;  . TEE WITHOUT CARDIOVERSION N/A 09/19/2018   Procedure: TRANSESOPHAGEAL ECHOCARDIOGRAM (TEE);  Surgeon: [name], MD;  Location: [place];  Service: Open Heart Surgery;  Laterality: N/A;  . TONSILLECTOMY    . TONSILLECTOMY      Current Medications: Current Meds  Medication Sig  . acetaminophen (TYLENOL) 500 MG tablet Take 1,000 mg by mouth every 6 (six) hours as needed (for pain).  Marland Kitchen amiodarone (PACERONE) 200 MG tablet Take 200 mg by mouth daily.  Marland Kitchen aspirin EC 81 MG EC tablet Take 1 tablet (81 mg total) by  mouth daily.  . Multiple Vitamins-Minerals (AIRBORNE) CHEW Chew 1 tablet by mouth 2 (two) times daily as needed (FOR IMMUNE HEALTH).  Marland Kitchen polyethylene glycol (MIRALAX / GLYCOLAX) packet Take 17 g by mouth daily.   Marland Kitchen warfarin (COUMADIN) 4 MG tablet Take 4 mg by mouth as directed.  . [DISCONTINUED] metoprolol tartrate (LOPRESSOR) 25 MG tablet Take 12.5 mg by mouth daily.     Allergies:   Patient has no known allergies.   Social History   Socioeconomic History  . Marital status: Married    Spouse name: Not on file  . Number of children: Not on file  . Years of education: Not on file  . Highest education level: Not on file  Occupational History  . Not on file  Social Needs  . Financial resource strain: Not on file  . Food insecurity:    Worry: Not on file    Inability: Not on file  . Transportation needs:    Medical: Not on file    Non-medical: Not on file  Tobacco Use  . Smoking status: Never Smoker  . Smokeless tobacco: Never Used  Substance and Sexual Activity  . Alcohol use: Never    Frequency: Never  . Drug use: Never  . Sexual activity: Not on file  Lifestyle  . Physical activity:    Days per week: Not on file    Minutes per session: Not on file  . Stress: Not on file  Relationships  . Social connections:    Talks on phone: Not on file    Gets together: Not on file    Attends religious service: Not on file    Active member of club or organization: Not on file    Attends meetings of clubs or organizations: Not on file    Relationship status: Not on file  Other Topics Concern  . Not on file  Social History Narrative  . Not on file     Family History: The patient's family history includes Heart attack in her brother; Heart failure in her mother; Hypertension in her father and mother; Skin cancer in her father. ROS:   Please see the history of present illness.     All other systems reviewed and are negative.  EKGs/Labs/Other Studies Reviewed:    The following  studies were reviewed today:   EKG:     Recent Labs: 04/30/2018: TSH 1.290 09/17/2018: ALT 17 09/20/2018: Magnesium 2.4 09/22/2018: BUN 6; Creatinine, Ser 0.52; Hemoglobin 8.8; Platelets 109; Potassium 3.6; Sodium 137  Recent Lipid Panel    Component Value Date/Time   CHOL 175 09/12/2008 0913  TRIG 40 09/12/2008 0913   HDL 57.8 09/12/2008 0913   CHOLHDL 3.0 CALC 09/12/2008 0913   VLDL 8 09/12/2008 0913   LDLCALC 109 (H) 09/12/2008 0913    Physical Exam: Blood pressure 98/78, pulse 90, height 5\' 8"  (1.727 m), weight 158 lb (71.7 kg), SpO2 98 %.  GEN:  Well nourished, well developed in no acute distress HEENT: Normal NECK: No JVD; No carotid bruits LYMPHATICS: No lymphadenopathy CARDIAC: Irreg.initially.   Later the HR was very regular .       The lateral aspect of the main surgical wound above the left breast  has a small monofilament   suture extruding from the skin. The area was cleaned with betadine .  The suture was pulled out without any difficulty .     This is associated with a small pustule. The lateral  thoracostomy site has a small absorbable suture coming from the wound .   This was  bathed with betadine and snipped at the skin and rebathed with betadine.  RESPIRATORY:  Clear to auscultation without rales, wheezing or rhonchi  ABDOMEN: Soft, non-tender, non-distended MUSCULOSKELETAL:  No edema; No deformity  SKIN: Warm and dry NEUROLOGIC:  Alert and oriented x 3  ECG :  Oct. 21, 2019:   Sinus brady at 48.   No arrhythmais    ASSESSMENT:    1. History of mitral valve repair   2. PAF (paroxysmal atrial fibrillation) (HCC)    PLAN:    In order of problems listed above:    2.  Mitral regurgitation:   S/p MV repair.  Her valve sounds great.  She had 2 areas along her incisions where there was some suture coming from the skin.  These sites were based with Betadine and snipped off at the skin.  There was a small pustule associated with the lateral aspect of her main  incision.  We will give her Keflex 500 mg twice a day for 1 week.  She is to keep an eye on that.  3. PAF: Kara Owens  has had a few episodes of heart rate irregularities.  She had a brief episode of paroxysmal atrial fib  while in the hospital.  She is on Coumadin and amiodarone 200 mg a day.  If she has arrhythmias that last for longer than 15 to 30 minutes of instructed her to come over to the office and get an EKG..     3.  .  Family history of cardiac disease :        Medication Adjustments/Labs and Tests Ordered: Current medicines are reviewed at length with the patient today.  Concerns regarding medicines are outlined above.  Orders Placed This Encounter  Procedures  . EKG 12-Lead   Meds ordered this encounter  Medications  . cephALEXin (KEFLEX) 500 MG capsule    Sig: Take 1 capsule (500 mg total) by mouth 2 (two) times daily for 7 days.    Dispense:  14 capsule    Refill:  0      Signed, [name], MD  10/15/2018 6:19 PM    [place]

## 2018-10-15 NOTE — Patient Instructions (Signed)
Medication Instructions:  Your physician has recommended you make the following change in your medication:   TAKE Keflex 500 mg twice daily x 7 days STOP Metoprolol  If you need a refill on your cardiac medications before your next appointment, please call your pharmacy.   Lab work: None Ordered  If you have labs (blood work) drawn today and your tests are completely normal, you will receive your results only by: Marland Kitchen MyChart Message (if you have MyChart) OR . A paper copy in the mail If you have any lab test that is abnormal or we need to change your treatment, we will call you to review the results.   Testing/Procedures: None Ordered   Follow-Up: **Call our office for an EKG if you have irregular heart rate  At [facility], you and your health needs are our priority.  As part of our continuing mission to provide you with exceptional heart care, we have created designated Provider Care Teams.  These Care Teams include your primary Cardiologist (physician) and Advanced Practice Providers (APPs -  Physician Assistants and Nurse Practitioners) who all work together to provide you with the care you need, when you need it. You will need a follow up appointment in:  2 months.  You may see [name], MD or one of the following Advanced Practice Providers on your designated Care Team: Richardson Dopp, PA-C [place], [place] . Daune Perch, NP

## 2018-10-16 ENCOUNTER — Telehealth (HOSPITAL_COMMUNITY): Payer: Self-pay

## 2018-10-16 NOTE — Telephone Encounter (Signed)
Pt insurance is active and benefits verified through [facility]. Co-pay $10.00, DED $200.00/$200.00 met, out of pocket $2,500.00/$988.89 met, co-insurance 0%. No pre-authorization. Passport, 10/16/18 @ 9:59AM, REF# [id number]  Will contact patient to see if she is interested in the Cardiac Rehab Program. If interested, patient will need to complete follow up appt. Once completed, patient will be contacted for scheduling upon review by the RN Navigator.

## 2018-10-18 ENCOUNTER — Telehealth (HOSPITAL_COMMUNITY): Payer: Self-pay

## 2018-10-18 NOTE — Telephone Encounter (Signed)
Attempted to call patient in regards to Cardiac Rehab - LM on VM 

## 2018-10-18 NOTE — Telephone Encounter (Signed)
Patient returned Cardiac Rehab phone call and is interested in participating in the Cardiac Rehab Program. Patient will come in for orientation on 12/18/18 @ 7:30AM and will attend the 6:45AM exercise class.  Mailed homework package.  Went over insurance, patient verbalized understanding.

## 2018-10-19 ENCOUNTER — Ambulatory Visit (INDEPENDENT_AMBULATORY_CARE_PROVIDER_SITE_OTHER): Payer: 59 | Admitting: Pharmacist

## 2018-10-19 DIAGNOSIS — Z9889 Other specified postprocedural states: Secondary | ICD-10-CM

## 2018-10-19 DIAGNOSIS — Z5181 Encounter for therapeutic drug level monitoring: Secondary | ICD-10-CM

## 2018-10-19 DIAGNOSIS — I4891 Unspecified atrial fibrillation: Secondary | ICD-10-CM

## 2018-10-19 LAB — POCT INR: INR: 3.2 — AB (ref 2.0–3.0)

## 2018-10-19 NOTE — Patient Instructions (Signed)
Description   Take only 1/2 tablet today then continue 1 tablet daily daily except 1/2 tablet on Sundays and Thursdays. Recheck in one week. Call us with any medication changes or concerns to [facility] Clinic [phone], Main # [phone].

## 2018-10-24 ENCOUNTER — Telehealth: Payer: Self-pay | Admitting: Nurse Practitioner

## 2018-10-24 NOTE — Telephone Encounter (Signed)
Called patient per request from Dr. [name] to assess the healing of site that he removed stitches from at ov on 10/21. She states the site looks good without redness or drainage. She has no complaints and thanked me for the call.

## 2018-10-25 ENCOUNTER — Telehealth: Payer: Self-pay

## 2018-10-25 NOTE — Telephone Encounter (Signed)
Ms. Kara Owens contacted the office with questions about her Amiodarone.  She stated it was discussed at her last appointment here with the PA, however, she could not remember what was said.  Per Junie Panning, PA at her last visit she was to stop her Amiodarone once the bottle she currently had was empty.  She also asked if she needed an ECHO before she sees Dr. Roxy Manns next year for her follow-up.  I advised that if an ECHO is needed her Cardiologist would order one.  She acknowledged receipt.  Patient stated she would contact her Cardiologist.

## 2018-10-26 ENCOUNTER — Ambulatory Visit (INDEPENDENT_AMBULATORY_CARE_PROVIDER_SITE_OTHER): Payer: 59 | Admitting: Pharmacist

## 2018-10-26 DIAGNOSIS — Z5181 Encounter for therapeutic drug level monitoring: Secondary | ICD-10-CM | POA: Diagnosis not present

## 2018-10-26 DIAGNOSIS — Z9889 Other specified postprocedural states: Secondary | ICD-10-CM | POA: Diagnosis not present

## 2018-10-26 DIAGNOSIS — I4891 Unspecified atrial fibrillation: Secondary | ICD-10-CM

## 2018-10-26 DIAGNOSIS — Z23 Encounter for immunization: Secondary | ICD-10-CM | POA: Diagnosis not present

## 2018-10-26 LAB — POCT INR: INR: 2.5 (ref 2.0–3.0)

## 2018-10-26 NOTE — Patient Instructions (Signed)
Description   Continue 1 tablet daily daily except 1/2 tablet on Sundays and Thursdays. Recheck in 10 days. Stopping amiodarone on 11/9. Call us with any medication changes or concerns to [facility]in Clinic [phone], Main # [phone].

## 2018-10-31 ENCOUNTER — Telehealth (HOSPITAL_COMMUNITY): Payer: Self-pay

## 2018-11-07 ENCOUNTER — Ambulatory Visit (INDEPENDENT_AMBULATORY_CARE_PROVIDER_SITE_OTHER): Payer: 59 | Admitting: *Deleted

## 2018-11-07 DIAGNOSIS — Z9889 Other specified postprocedural states: Secondary | ICD-10-CM | POA: Diagnosis not present

## 2018-11-07 DIAGNOSIS — I4891 Unspecified atrial fibrillation: Secondary | ICD-10-CM

## 2018-11-07 DIAGNOSIS — Z5181 Encounter for therapeutic drug level monitoring: Secondary | ICD-10-CM

## 2018-11-07 LAB — POCT INR: INR: 2.5 (ref 2.0–3.0)

## 2018-11-07 NOTE — Patient Instructions (Signed)
Description   Continue 1 tablet daily daily except 1/2 tablet on Sundays and Thursdays. Recheck in 10 days. Stopped amiodarone on 11/8. Call us with any medication changes or concerns to [facility]in Clinic [phone], Main # [phone].

## 2018-11-21 ENCOUNTER — Ambulatory Visit (INDEPENDENT_AMBULATORY_CARE_PROVIDER_SITE_OTHER): Payer: 59 | Admitting: *Deleted

## 2018-11-21 DIAGNOSIS — Z9889 Other specified postprocedural states: Secondary | ICD-10-CM | POA: Diagnosis not present

## 2018-11-21 DIAGNOSIS — I4891 Unspecified atrial fibrillation: Secondary | ICD-10-CM

## 2018-11-21 DIAGNOSIS — Z5181 Encounter for therapeutic drug level monitoring: Secondary | ICD-10-CM

## 2018-11-21 LAB — POCT INR: INR: 2.9 (ref 2.0–3.0)

## 2018-11-21 NOTE — Patient Instructions (Signed)
Description   Continue 1 tablet daily daily except 1/2 tablet on Sundays and Thursdays. Recheck in 3 weeks with MD appt. Stopped amiodarone on 11/8. Call us with any medication changes or concerns to [facility] Clinic [phone], Main # [phone].

## 2018-11-28 DIAGNOSIS — L573 Poikiloderma of Civatte: Secondary | ICD-10-CM | POA: Diagnosis not present

## 2018-11-28 DIAGNOSIS — Z85828 Personal history of other malignant neoplasm of skin: Secondary | ICD-10-CM | POA: Diagnosis not present

## 2018-11-28 DIAGNOSIS — B078 Other viral warts: Secondary | ICD-10-CM | POA: Diagnosis not present

## 2018-12-10 ENCOUNTER — Telehealth (HOSPITAL_COMMUNITY): Payer: Self-pay | Admitting: Pharmacy Technician

## 2018-12-11 NOTE — Telephone Encounter (Signed)
Cardiac Rehab Medication Review by a Pharmacist  Does the patient  feel that his/her medications are working for him/her?  yes  Has the patient been experiencing any side effects to the medications prescribed?  no  Does the patient measure his/her own blood pressure or blood glucose at home?  yes   Does the patient have any problems obtaining medications due to transportation or finances?   no  Understanding of regimen: fair Understanding of indications: fair Potential of compliance: fair  Pharmacist comments:  Reports checking blood pressure twice weekly with most readings 100/60s. Patient reports some dizziness but rarely.   Brendolyn Patty, PharmD PGY1 Pharmacy Resident Phone [phone]  12/11/2018   1:55 PM

## 2018-12-12 ENCOUNTER — Ambulatory Visit (INDEPENDENT_AMBULATORY_CARE_PROVIDER_SITE_OTHER): Payer: 59 | Admitting: Cardiovascular Disease

## 2018-12-12 ENCOUNTER — Telehealth: Payer: Self-pay | Admitting: Cardiovascular Disease

## 2018-12-12 ENCOUNTER — Encounter: Payer: Self-pay | Admitting: Cardiovascular Disease

## 2018-12-12 ENCOUNTER — Ambulatory Visit (INDEPENDENT_AMBULATORY_CARE_PROVIDER_SITE_OTHER): Payer: 59 | Admitting: *Deleted

## 2018-12-12 VITALS — BP 122/90 | HR 90 | Ht 68.0 in | Wt 161.0 lb

## 2018-12-12 DIAGNOSIS — Z9889 Other specified postprocedural states: Secondary | ICD-10-CM | POA: Diagnosis not present

## 2018-12-12 DIAGNOSIS — I4891 Unspecified atrial fibrillation: Secondary | ICD-10-CM

## 2018-12-12 DIAGNOSIS — Z5181 Encounter for therapeutic drug level monitoring: Secondary | ICD-10-CM | POA: Diagnosis not present

## 2018-12-12 DIAGNOSIS — R0602 Shortness of breath: Secondary | ICD-10-CM

## 2018-12-12 LAB — POCT INR: INR: 1.3 — AB (ref 2.0–3.0)

## 2018-12-12 MED ORDER — AMOXICILLIN 500 MG PO TABS: ORAL_TABLET | ORAL | 2 refills | Status: DC

## 2018-12-12 NOTE — Patient Instructions (Addendum)
Description   Today take 1.5 tablets and tomorrow take 1 tablet then continue 1 tablet daily daily except 1/2 tablet on Sundays and Thursdays. Recheck in 1 week. Stopped amiodarone on 11/8. Call us with any medication changes or concerns to Coumadin Clinic [phone], Main # [phone].

## 2018-12-12 NOTE — Telephone Encounter (Signed)
12-12-18. At check-out patient stated she  wanted to call in to schedule follow up with [name], MD./rval

## 2018-12-12 NOTE — Progress Notes (Signed)
Cardiology Office Note:    Date:  12/12/2018   ID:  Kara Owens, DOB [birth year], MRN [id number]  PCP:  Kara Stains, MD  Cardiologist:  Kara Moores, MD    Referring MD: Kara Stains, MD   Chief Complaint  Patient presents with  . Mitral Valve Prolapse        Kara Owens is a 47 y.o. female with a hx of mitral valve prolapse.  I last saw Kara Owens in 2016.  He has a history of mitral valve prolapse with mild mitral regurgitation.  Mother Kara Owens ) had an ablation last year. Brother had an MI at age 60. ( non smoker, HTN, obese)  Had labs at [name] [facility] office last week.  Had some pleuretic CP last week,    Was given prednisone which helped No cough, cold, no hemoptysis,  No fever, Pain was worse at nihgt.   Trying to exercise  Works in [employer].     Labs from Primary MD   Total chol = 187 Trigs = 70 HDL = 82 LDL = 91   February 23, 2018 Seen with husband  BP was elevated at the TEE  Brought her BP log , all readings were great  Is not exercising regularly   June 08, 2018:  Son is seen back today for follow-up of her mitral valve prolapse.  She has worn an event monitor and was found to have episodes of nonsustained ventricular tachycardia.  We started her on metoprolol 25 mg twice a day which seemed to help with the episodes of nonsustained VT.  We tried increasing the dose to 25 mg 3 times a day but she does  not tolerate this vey well at all due to profound fatigue   She had a stress Myoview study on May 8 which revealed an anterior defect that was thought to be due to breast artifact.    Coronary CT angiogram revealed normal coronary arteries.  She had a coronary calcium score of 0.  Had an episode of chest tightness.   Occurs after the palpitations. Last for a few minutes. Marland Kitchen   Resolves with deep breathing .  July 10, 2018:  Seen with husband , Kara Owens She has a little bit more energy now that she has reduce the metoprolol from 25 mg 3  times daily to 25 mg twice daily.  She still has palpitations He thinks the palpitations may have worsened slightly since reducing the dose of metoprolol.  Oct. 21, 2019  Kara Owens is back - had MV repiar on sept. 25,  Had PAF - is on amio  December 12, 2018:  Kara Owens is seen today for follow up for MV repair   - with Kara Owens Husband.  Still very sore Walks on weekend   Legs started swelling last week .   Has Several neurologic issues that are not clear.  She is having some dizziness.  She also has had some loss of memory at times.  She also has some hand shaking.  We discussed the fact that she would need to go to see a neurologist if these continued.  Past Medical History:  Diagnosis Date  . Chest pain    Left sided--sharp pain  . Complication of anesthesia   . Fatigue    Extreme  . Mitral regurgitation    Mild  . Mitral valve prolapse   . Palpitations   . PONV (postoperative nausea and vomiting)   . S/P minimally invasive mitral valve  repair 09/19/2018   Artificial Gore-tex neochord placement x10 with 32 mm Sorin Memo 4D ring annuloplasty via right mini thoracotomy approach    Past Surgical History:  Procedure Laterality Date  . EYE SURGERY Left    retinal repair   . MITRAL VALVE REPAIR Right 09/19/2018   Procedure: MINIMALLY INVASIVE MITRAL VALVE REPAIR (MVR). 40mm MEMO 4D RING.;  Surgeon: [name], MD;  Location: [place];  Service: Open Heart Surgery;  Laterality: Right;  GLUTARALDEHYDE  . TEE WITHOUT CARDIOVERSION N/A 12/08/2017   Procedure: TRANSESOPHAGEAL ECHOCARDIOGRAM (TEE);  Surgeon: [name], MD;  Location: [place];  Service: Cardiovascular;  Laterality: N/A;  . TEE WITHOUT CARDIOVERSION N/A 07/27/2018   Procedure: TRANSESOPHAGEAL ECHOCARDIOGRAM (TEE);  Surgeon: [name], MD;  [facility] END[facility]COPY;  Service: Cardiovascular;  Laterality: N/A;  . TEE WITHOUT CARDIOVERSION N/A 09/19/2018   Procedure: TRANSESOPHAGEAL ECHOCARDIOGRAM (TEE);  Surgeon:  [name], MD;  Location: [place];  Service: Open Heart Surgery;  Laterality: N/A;  . TONSILLECTOMY    . TONSILLECTOMY      Current Medications: Current Meds  Medication Sig  . acetaminophen (TYLENOL) 500 MG tablet Take 1,000 mg by mouth every 6 (six) hours as needed (for pain).  Marland Kitchen aspirin EC 81 MG EC tablet Take 1 tablet (81 mg total) by mouth daily.  . Multiple Vitamins-Minerals (AIRBORNE) CHEW Chew 1 tablet by mouth 2 (two) times daily as needed (FOR IMMUNE HEALTH).  Marland Kitchen polyethylene glycol (MIRALAX / GLYCOLAX) packet Take 17 g by mouth daily.   Marland Kitchen warfarin (COUMADIN) 4 MG tablet Take 4 mg by mouth as directed.     Allergies:   Patient has no known allergies.   Social History   Socioeconomic History  . Marital status: Married    Spouse name: Not on file  . Number of children: Not on file  . Years of education: Not on file  . Highest education level: Not on file  Occupational History  . Not on file  Social Needs  . Financial resource strain: Not on file  . Food insecurity:    Worry: Not on file    Inability: Not on file  . Transportation needs:    Medical: Not on file    Non-medical: Not on file  Tobacco Use  . Smoking status: Never Smoker  . Smokeless tobacco: Never Used  Substance and Sexual Activity  . Alcohol use: Never    Frequency: Never  . Drug use: Never  . Sexual activity: Not on file  Lifestyle  . Physical activity:    Days per week: Not on file    Minutes per session: Not on file  . Stress: Not on file  Relationships  . Social connections:    Talks on phone: Not on file    Gets together: Not on file    Attends religious service: Not on file    Active member of club or organization: Not on file    Attends meetings of clubs or organizations: Not on file    Relationship status: Not on file  Other Topics Concern  . Not on file  Social History Narrative  . Not on file     Family History: The patient's family history includes Heart attack in her  brother; Heart failure in her mother; Hypertension in her father and mother; Skin cancer in her father. ROS:   Please see the history of present illness.     All other systems reviewed and are negative.  EKGs/Labs/Other Studies Reviewed:    The following studies were reviewed today:   EKG:     Recent Labs: 04/30/2018: TSH 1.290 09/17/2018: ALT 17 09/20/2018: Magnesium 2.4 09/22/2018: BUN 6; Creatinine, Ser 0.52; Hemoglobin 8.8; Platelets 109; Potassium 3.6; Sodium 137  Recent Lipid Panel    Component Value Date/Time   CHOL 175 09/12/2008 0913   TRIG 40 09/12/2008 0913   HDL 57.8 09/12/2008 0913   CHOLHDL 3.0 CALC 09/12/2008 0913   VLDL 8 09/12/2008 0913   LDLCALC 109 (H) 09/12/2008 0913    Physical Exam: Blood pressure 122/90, pulse 90, height 5\' 8"  (1.727 m), weight 161 lb (73 kg), SpO2 99 %.  GEN:  Well nourished, well developed in no acute distress HEENT: Normal NECK: No JVD; No carotid bruits LYMPHATICS: No lymphadenopathy CARDIAC: RRR , soft systolic murmur  Right thoroax incision is healing,  Still numb  RESPIRATORY:  Clear to auscultation without rales, wheezing or rhonchi  ABDOMEN: Soft, non-tender, non-distended MUSCULOSKELETAL:  No edema; No deformity  SKIN: Warm and dry NEUROLOGIC:  Alert and oriented x 3  ECG :      ASSESSMENT:    1. S/P mitral valve repair   2. SOB (shortness of breath)    PLAN:    1.  Status post mitral valve repair: Son is doing very well.  We will continue with her same medications.  We will give her a prescription for amoxicillin 2 g to take 1 hour prior to dental work as needed.  contniue ASA 81 mg a day for now.   Will defer to Dr. Roxy Manns .   She is having little leg swelling.  I like to do an echocardiogram to assess her postoperative valvular condition.  I think She will continue to heal up.  She will be starting cardiac rehab soon.  I will see her again in 4 to 6 weeks for follow-up visit.  3. PAF:  We have stopped the  amiodarone.  I will leave it up to Dr. Roxy Manns to stop the Coumadin but think that her Coumadin could be stopped at this point.   She is not had any recurrent episodes of atrial fibrillation.      Medication Adjustments/Labs and Tests Ordered: Current medicines are reviewed at length with the patient today.  Concerns regarding medicines are outlined above.  Orders Placed This Encounter  Procedures  . ECHOCARDIOGRAM COMPLETE   Meds ordered this encounter  Medications  . amoxicillin (AMOXIL) 500 MG tablet    Sig: Take 2 grams 1 hour prior to dental work    Dispense:  4 tablet    Refill:  2      Signed, Kara Moores, MD  12/12/2018 3:51 PM    [place]

## 2018-12-12 NOTE — Patient Instructions (Signed)
Medication Instructions:  Your physician has recommended you make the following change in your medication:  TAKE Amoxicillin 2000 mg (2 g) 1 hour prior to dental work  If you need a refill on your cardiac medications before your next appointment, please call your pharmacy.    Lab work: None Ordered   Testing/Procedures: Your physician has requested that you have an echocardiogram. Echocardiography is a painless test that uses sound waves to create images of your heart. It provides your doctor with information about the size and shape of your heart and how well your heart's chambers and valves are working. This procedure takes approximately one hour. There are no restrictions for this procedure.    Follow-Up: At [facility], you and your health needs are our priority.  As part of our continuing mission to provide you with exceptional heart care, we have created designated Provider Care Teams.  These Care Teams include your primary Cardiologist (physician) and Advanced Practice Providers (APPs -  Physician Assistants and Nurse Practitioners) who all work together to provide you with the care you need, when you need it. You will need a follow up appointment in:  6 weeks.  You may see [name], MD or one of the following Advanced Practice Providers on your designated Care Team: Richardson Dopp, PA-C [place], [place] . Daune Perch, NP

## 2018-12-13 IMAGING — MR MR CARD MORPHOLOGY WO/W CM
10 of 12 series · 14 of 16 positions shown · IV contrast (Contrast agent)
Comparison: none

CLINICAL DATA: VT

EXAM:
CARDIAC MRI
TECHNIQUE: The patient was scanned on a 1.5 Tesla GE magnet. A dedicated
cardiac coil was used. Functional imaging was done using Fiesta
sequences. [DATE], and 4 chamber views were done to assess for RWMA's.
Modified Jingling rule using a short axis stack was used to
calculate an ejection fraction on a dedicated work station using
Circle software. The patient received 30 cc of Multihance. After 10
minutes inversion recovery sequences were used to assess for
infiltration and scar tissue.

[Series 6: bSSFP · oblique · 8.0mm · 1.61mm/px · 2 of 300 slices shown (1 of 5)]
[im 1/300]
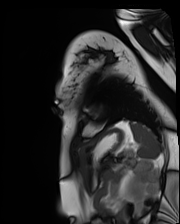
[im 300/300]
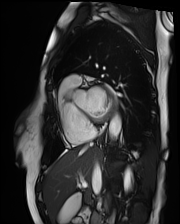

[Series 7: t1_tse_db axial · axial · 6.0mm · 1.03mm/px · 1 of 14 slices shown]
[im 1/14]
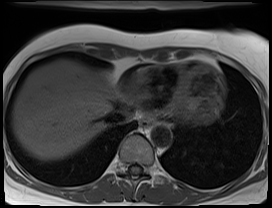

[Series 8: t2_stir_db axial · axial · 6.0mm · 1.35mm/px · 1 of 14 slices shown]
[im 1/14]
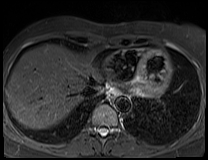

[Series 9: t1_tse_db sag · sagittal · 5.0mm · 1.03mm/px · 1 of 18 slices shown]
[im 1/18]
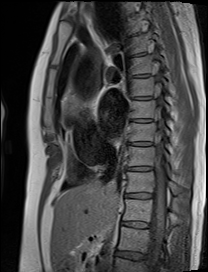

[Series 10: bSSFP · axial · 6.0mm · 1.43mm/px · z∈[+5,+83]mm · 4 of 350 slices shown (2 of 5)]
[im 1/350]
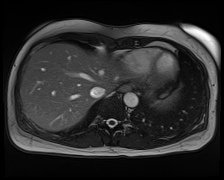
[im 117/350]
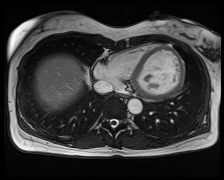
[im 233/350]
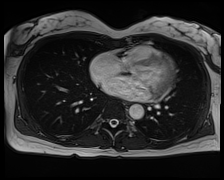
[im 350/350]
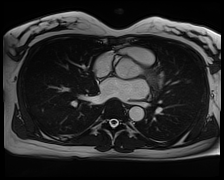

[Series 11: bSSFP · oblique · 6.0mm · 1.41mm/px · 1 of 25 slices shown (3 of 5)]
[im 1/25]
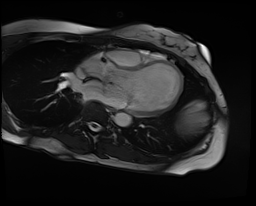

[Series 12: bSSFP · oblique · 6.0mm · 1.41mm/px · 1 of 25 slices shown (4 of 5)]
[im 1/25]
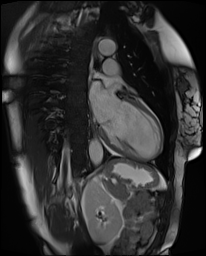

[Series 13: bSSFP · oblique · 6.0mm · 1.41mm/px · 1 of 25 slices shown (5 of 5)]
[im 1/25]
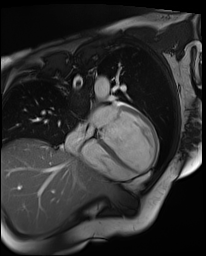

[Series 17: lge short axis_mag · oblique · 8.0mm · 1.50mm/px · 1 of 10 slices shown]
[im 1/10]
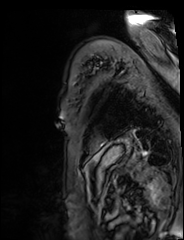

[Series 18: lge short axis_psir · oblique · 8.0mm · 1.50mm/px · 1 of 10 slices shown]
[im 1/10]
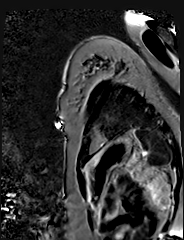

[14 of 16 positions shown; findings below may reference images not displayed]

FINDINGS: Mildly dilated left ventricle with normal wall thickness. Normal
wall motion with EF normal right ventricular size and systolic
function. Normal right atrial size. Mild left atrial enlargement.
Trileaflet aortic valve without stenosis or regurgitation. There is
posterior>anterior mitral leaflet prolapse with the appearance of
severe mitral regurgitation, directed anteriorly. Unfortunately,
flow sequences to quantify mitral regurgitation were not done.

On delayed enhancement imaging, there was no myocardial late
gadolinium enhancement (LGE) noted.

Measurements:

LVEDD 5.8 cm

LV EDV 200 mL

LV SV 131

LV EF 66%
IMPRESSION: 1.  Mildly dilated LV with EF 66%.

2.  Normal RV size and systolic function.

3. Bileaflet mitral valve prolapse, posterior>anterior, with the
appearance of severe, anteriorly-directed mitral regurgitation.
Unfortunately, flow sequences to quantify MR were not done.

4. No myocardial LGE, so no definitive evidence for prior MI,
infiltrative disease, or myocarditis.

Dainiuis Pieslikas

## 2018-12-17 ENCOUNTER — Telehealth (HOSPITAL_COMMUNITY): Payer: Self-pay

## 2018-12-17 NOTE — Progress Notes (Signed)
Kara Owens 47 y.o. female DOB 1971/05/15 MRN [id number]       Nutrition  No diagnosis found. Past Medical History:  Diagnosis Date  . Chest pain    Left sided--sharp pain  . Complication of anesthesia   . Fatigue    Extreme  . Mitral regurgitation    Mild  . Mitral valve prolapse   . Palpitations   . PONV (postoperative nausea and vomiting)   . S/P minimally invasive mitral valve repair 09/19/2018   Artificial Gore-tex neochord placement x10 with 32 mm Sorin Memo 4D ring annuloplasty via right mini thoracotomy approach   Meds reviewed.       Current Outpatient Medications (Analgesics):  .  acetaminophen (TYLENOL) 500 MG tablet, Take 1,000 mg by mouth every 6 (six) hours as needed (for pain). Marland Kitchen  aspirin EC 81 MG EC tablet, Take 1 tablet (81 mg total) by mouth daily.  Current Outpatient Medications (Hematological):  .  warfarin (COUMADIN) 4 MG tablet, Take 4 mg by mouth as directed.  Current Outpatient Medications (Other):  .  amoxicillin (AMOXIL) 500 MG tablet, Take 2 grams 1 hour prior to dental work .  Multiple Vitamins-Minerals (AIRBORNE) CHEW, Chew 1 tablet by mouth 2 (two) times daily as needed ([place]). Marland Kitchen  polyethylene glycol (MIRALAX / GLYCOLAX) packet, Take 17 g by mouth daily.    HT: Ht Readings from Last 1 Encounters:  12/12/18 5\' 8"  (1.727 m)    WT: Wt Readings from Last 5 Encounters:  12/12/18 161 lb (73 kg)  10/15/18 158 lb (71.7 kg)  10/08/18 156 lb (70.8 kg)  09/24/18 152 lb 12.5 oz (69.3 kg)  09/17/18 156 lb (70.8 kg)     BMI = 24.49    12/12/18  Current tobacco use? No       Labs:  Lipid Panel     Component Value Date/Time   CHOL 175 09/12/2008 0913   TRIG 40 09/12/2008 0913   HDL 57.8 09/12/2008 0913   CHOLHDL 3.0 CALC 09/12/2008 0913   VLDL 8 09/12/2008 0913   LDLCALC 109 (H) 09/12/2008 0913    Lab Results  Component Value Date   HGBA1C 5.5 09/17/2018   CBG (last 3)  No results for input(s): GLUCAP in the last 72  hours.  Nutrition Diagnosis ? Food-and nutrition-related knowledge deficit related to lack of exposure to information as related to diagnosis of: ? CVD   Nutrition Goal(s):  ? To be determined  Plan:  Pt to attend nutrition classes ? Nutrition I ? Nutrition II ? Portion Distortion  Will provide client-centered nutrition education as part of interdisciplinary care.   Monitor and evaluate progress toward nutrition goal with team.  Laurina Bustle, MS, RD, LDN 12/17/2018 11:32 AM

## 2018-12-17 NOTE — Telephone Encounter (Signed)
**  UPDATE**  Pt insurance is active and benefits verified through [facility]. Co-pay $10.00, DED $200.00/$200.00 met, out of pocket $2,500.00/$2,500.00 met, co-insurance 0%. No pre-authorization required. Passport, 12/17/18 _0 :38AM, REF# [id number]

## 2018-12-18 ENCOUNTER — Encounter (HOSPITAL_COMMUNITY)
Admission: RE | Admit: 2018-12-18 | Discharge: 2018-12-18 | Disposition: A | Discharge status: Home / Self Care | Payer: 59 | Source: Ambulatory Visit | Attending: Cardiovascular Disease | Admitting: Cardiovascular Disease

## 2018-12-18 ENCOUNTER — Encounter (HOSPITAL_COMMUNITY): Payer: Self-pay

## 2018-12-18 ENCOUNTER — Ambulatory Visit (INDEPENDENT_AMBULATORY_CARE_PROVIDER_SITE_OTHER): Payer: 59 | Admitting: *Deleted

## 2018-12-18 VITALS — BP 104/62 | HR 90 | Ht 68.0 in | Wt 161.4 lb

## 2018-12-18 DIAGNOSIS — Z7982 Long term (current) use of aspirin: Secondary | ICD-10-CM | POA: Diagnosis not present

## 2018-12-18 DIAGNOSIS — Z7901 Long term (current) use of anticoagulants: Secondary | ICD-10-CM | POA: Diagnosis not present

## 2018-12-18 DIAGNOSIS — I4891 Unspecified atrial fibrillation: Secondary | ICD-10-CM

## 2018-12-18 DIAGNOSIS — Z79899 Other long term (current) drug therapy: Secondary | ICD-10-CM | POA: Diagnosis not present

## 2018-12-18 DIAGNOSIS — Z5181 Encounter for therapeutic drug level monitoring: Secondary | ICD-10-CM

## 2018-12-18 DIAGNOSIS — Z9889 Other specified postprocedural states: Secondary | ICD-10-CM | POA: Diagnosis not present

## 2018-12-18 HISTORY — DX: Cardiac arrhythmia, unspecified: I49.9

## 2018-12-18 LAB — POCT INR: INR: 1.6 — AB (ref 2.0–3.0)

## 2018-12-18 NOTE — Patient Instructions (Signed)
Description   Today and tomorrow take 1.5 tablets then start taking 1 tablet daily daily.  Recheck in 1 week. Stopped amiodarone on 11/8. Call us with any medication changes or concerns to [facility] [phone], Main # [phone].

## 2018-12-18 NOTE — Progress Notes (Signed)
Cardiac Individual Treatment Plan  Patient Details  Name: Kara Owens MRN: [id number] Date of Birth: [birth year] Referring Provider:     CARDIAC REHAB PHASE II ORIENTATION from 12/18/2018 in [place]  Referring Provider  [name], Arnette Norris, MD      Initial Encounter Date:    CARDIAC REHAB PHASE II ORIENTATION from 12/18/2018 in [place]  Date  12/18/18      Visit Diagnosis: 09/19/18 MV Repair  Patient's Home Medications on Admission:  Current Outpatient Medications:  .  acetaminophen (TYLENOL) 500 MG tablet, Take 1,000 mg by mouth every 6 (six) hours as needed (for pain)., Disp: , Rfl:  .  amoxicillin (AMOXIL) 500 MG tablet, Take 2 grams 1 hour prior to dental work, Disp: 4 tablet, Rfl: 2 .  aspirin EC 81 MG EC tablet, Take 1 tablet (81 mg total) by mouth daily., Disp: , Rfl:  .  Multiple Vitamins-Minerals (AIRBORNE) CHEW, Chew 1 tablet by mouth 2 (two) times daily as needed ([place])., Disp: , Rfl:  .  polyethylene glycol (MIRALAX / GLYCOLAX) packet, Take 17 g by mouth daily. , Disp: , Rfl:  .  warfarin (COUMADIN) 4 MG tablet, Take 4 mg by mouth as directed., Disp: , Rfl:   Past Medical History: Past Medical History:  Diagnosis Date  . Arrhythmia    post op atrial fibrillation  . Chest pain    Left sided--sharp pain  . Complication of anesthesia   . Fatigue    Extreme  . Mitral regurgitation    Mild  . Mitral valve prolapse   . Palpitations   . PONV (postoperative nausea and vomiting)   . S/P minimally invasive mitral valve repair 09/19/2018   Artificial Gore-tex neochord placement x10 with 32 mm Sorin Memo 4D ring annuloplasty via right mini thoracotomy approach    Tobacco Use: Social History   Tobacco Use  Smoking Status Never Smoker  Smokeless Tobacco Never Used    Labs: Recent Review Flowsheet Data    Labs for ITP Cardiac and Pulmonary Rehab Latest Ref Rng & Units 09/19/2018 09/19/2018  09/19/2018 09/19/2018 09/19/2018   Cholestrol 0 - 200 mg/dL - - - - -   LDLCALC 0 - 99 mg/dL - - - - -   HDL >39.0 mg/dL - - - - -   Trlycerides 0 - 149 mg/dL - - - - -   Hemoglobin A1c 4.8 - 5.6 % - - - - -   PHART 7.350 - 7.450 7.351 - 7.230(L) 7.308(L) -   PCO2ART 32.0 - 48.0 mmHg 37.5 - 50.6(H) 45.6 -   HCO3 20.0 - 28.0 mmol/L 20.7 - 21.5 22.8 -   TCO2 22 - 32 mmol/L 22 21(L) 23 24 24    ACIDBASEDEF 0.0 - 2.0 mmol/L 4.0(H) - 6.0(H) 3.0(H) -   O2SAT % 100.0 - 99.0 98.0 -      Capillary Blood Glucose: Lab Results  Component Value Date   GLUCAP 107 (H) 09/21/2018   GLUCAP 103 (H) 09/20/2018   GLUCAP 125 (H) 09/20/2018   GLUCAP 110 (H) 09/20/2018   GLUCAP 98 09/20/2018     Exercise Target Goals: Exercise Program Goal: Individual exercise prescription set using results from initial 6 min walk test and THRR while considering  patient's activity barriers and safety.   Exercise Prescription Goal: Initial exercise prescription builds to 30-45 minutes a day of aerobic activity, 2-3 days per week.  Home exercise guidelines will be given to  patient during program as part of exercise prescription that the participant will acknowledge.  Activity Barriers & Risk Stratification: Activity Barriers & Cardiac Risk Stratification - 12/18/18 0848      Activity Barriers & Cardiac Risk Stratification   Activity Barriers  None    Cardiac Risk Stratification  Moderate       6 Minute Walk: 6 Minute Walk    Row Name 12/18/18 0810         6 Minute Walk   Phase  Initial     Distance  1283 feet     Walk Time  6 minutes     # of Rest Breaks  0     MPH  2.43     METS  4.04     RPE  7     Perceived Dyspnea   0     VO2 Peak  14.14     Symptoms  Yes (comment)     Comments  Patient c/o slight chest tightness during walk test, which resolved with rest.     Resting HR  90 bpm     Resting BP  104/62     Resting Oxygen Saturation   98 %     Exercise Oxygen Saturation  during 6 min walk  99 %      Max Ex. HR  100 bpm     Max Ex. BP  108/64     2 Minute Post BP  104/70        Oxygen Initial Assessment:   Oxygen Re-Evaluation:   Oxygen Discharge (Final Oxygen Re-Evaluation):   Initial Exercise Prescription: Initial Exercise Prescription - 12/18/18 0900      Date of Initial Exercise RX and Referring Provider   Date  12/18/18    Referring Provider  [name], MD    Expected Discharge Date  02/22/19      Treadmill   MPH  2.5    Grade  0    Minutes  10    METs  2.91      Bike   Level  1.2    Minutes  10    METs  4.13      NuStep   Level  3    SPM  85    Minutes  10    METs  2.9      Prescription Details   Frequency (times per week)  3    Duration  Progress to 30 minutes of continuous aerobic without signs/symptoms of physical distress      Intensity   THRR 40-80% of Max Heartrate  68-137    Ratings of Perceived Exertion  11-13    Perceived Dyspnea  0-4      Progression   Progression  Continue to progress workloads to maintain intensity without signs/symptoms of physical distress.      Resistance Training   Training Prescription  Yes    Weight  3lbs    Reps  10-15       Perform Capillary Blood Glucose checks as needed.  Exercise Prescription Changes:   Exercise Comments:   Exercise Goals and Review: Exercise Goals    Row Name 12/18/18 0849             Exercise Goals   Increase Physical Activity  Yes       Intervention  Provide advice, education, support and counseling about physical activity/exercise needs.;Develop an individualized exercise prescription for aerobic and resistive training based on initial evaluation findings, risk  stratification, comorbidities and participant's personal goals.       Expected Outcomes  Short Term: Attend rehab on a regular basis to increase amount of physical activity.;Long Term: Add in home exercise to make exercise part of routine and to increase amount of physical activity.;Long Term: Exercising  regularly at least 3-5 days a week.       Increase Strength and Stamina  Yes       Intervention  Provide advice, education, support and counseling about physical activity/exercise needs.;Develop an individualized exercise prescription for aerobic and resistive training based on initial evaluation findings, risk stratification, comorbidities and participant's personal goals.       Expected Outcomes  Short Term: Increase workloads from initial exercise prescription for resistance, speed, and METs.;Short Term: Perform resistance training exercises routinely during rehab and add in resistance training at home;Long Term: Improve cardiorespiratory fitness, muscular endurance and strength as measured by increased METs and functional capacity (6MWT)       Able to understand and use rate of perceived exertion (RPE) scale  Yes       Intervention  Provide education and explanation on how to use RPE scale       Expected Outcomes  Short Term: Able to use RPE daily in rehab to express subjective intensity level;Long Term:  Able to use RPE to guide intensity level when exercising independently       Knowledge and understanding of Target Heart Rate Range (THRR)  Yes       Intervention  Provide education and explanation of THRR including how the numbers were predicted and where they are located for reference       Expected Outcomes  Short Term: Able to state/look up THRR;Long Term: Able to use THRR to govern intensity when exercising independently;Short Term: Able to use daily as guideline for intensity in rehab       Able to check pulse independently  Yes       Intervention  Provide education and demonstration on how to check pulse in carotid and radial arteries.;Review the importance of being able to check your own pulse for safety during independent exercise       Expected Outcomes  Short Term: Able to explain why pulse checking is important during independent exercise;Long Term: Able to check pulse independently and  accurately       Understanding of Exercise Prescription  Yes       Intervention  Provide education, explanation, and written materials on patient's individual exercise prescription       Expected Outcomes  Short Term: Able to explain program exercise prescription;Long Term: Able to explain home exercise prescription to exercise independently          Exercise Goals Re-Evaluation :   Discharge Exercise Prescription (Final Exercise Prescription Changes):   Nutrition:  Target Goals: Understanding of nutrition guidelines, daily intake of sodium 1500mg , cholesterol 200mg , calories 30% from fat and 7% or less from saturated fats, daily to have 5 or more servings of fruits and vegetables.  Biometrics: Pre Biometrics - 12/18/18 0745      Pre Biometrics   Height  5\' 8"  (1.727 m)    Weight  73.2 kg    Waist Circumference  33 inches    Hip Circumference  42.5 inches    Waist to Hip Ratio  0.78 %    BMI (Calculated)  24.54    Triceps Skinfold  35 mm    % Body Fat  35.9 %    Grip  Strength  30 kg    Flexibility  16.5 in    Single Leg Stand  30 seconds        Nutrition Therapy Plan and Nutrition Goals:   Nutrition Assessments:   Nutrition Goals Re-Evaluation:   Nutrition Goals Re-Evaluation:   Nutrition Goals Discharge (Final Nutrition Goals Re-Evaluation):   Psychosocial: Target Goals: Acknowledge presence or absence of significant depression and/or stress, maximize coping skills, provide positive support system. Participant is able to verbalize types and ability to use techniques and skills needed for reducing stress and depression.  Initial Review & Psychosocial Screening: Initial Psych Review & Screening - 12/18/18 1033      Initial Review   Current issues with  None Identified      Family Dynamics   Good Support System?  Yes   Noemi has her husband for support     Barriers   Psychosocial barriers to participate in program  There are no identifiable barriers or  psychosocial needs.      Screening Interventions   Interventions  Encouraged to exercise       Quality of Life Scores: Quality of Life - 12/18/18 0835      Quality of Life   Select  Quality of Life      Quality of Life Scores   Health/Function Pre  26.4 %    Socioeconomic Pre  26.43 %    Psych/Spiritual Pre  29.14 %    Family Pre  28.8 %    GLOBAL Pre  27.32 %      Scores of 19 and below usually indicate a poorer quality of life in these areas.  A difference of  2-3 points is a clinically meaningful difference.  A difference of 2-3 points in the total score of the Quality of Life Index has been associated with significant improvement in overall quality of life, self-image, physical symptoms, and general health in studies assessing change in quality of life.  PHQ-9: Recent Review Flowsheet Data    There is no flowsheet data to display.     Interpretation of Total Score  Total Score Depression Severity:  1-4 = Minimal depression, 5-9 = Mild depression, 10-14 = Moderate depression, 15-19 = Moderately severe depression, 20-27 = Severe depression   Psychosocial Evaluation and Intervention:   Psychosocial Re-Evaluation:   Psychosocial Discharge (Final Psychosocial Re-Evaluation):   Vocational Rehabilitation: Provide vocational rehab assistance to qualifying candidates.   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 Participant will state understanding/return demonstration of topics presented.  Learning Barriers/Preferences: Learning Barriers/Preferences - 12/18/18 1034      Learning Barriers/Preferences   Learning Barriers   Sight   wears contacts   Learning Preferences  Pictoral;Skilled Demonstration;Video       Education Topics: Count Your Pulse:  -Group instruction provided by verbal instruction, demonstration, patient participation and written materials to support subject.  Instructors address importance of being able to find your pulse and how to count your pulse when at home without a heart monitor.  Patients get hands on experience counting their pulse with staff help and individually.   Heart Attack, Angina, and Risk Factor Modification:  -  Group instruction provided by verbal instruction, video, and written materials to support subject.  Instructors address signs and symptoms of angina and heart attacks.    Also discuss risk factors for heart disease and how to make changes to improve heart health risk factors.   Functional Fitness:  -Group instruction provided by verbal instruction, demonstration, patient participation, and written materials to support subject.  Instructors address safety measures for doing things around the house.  Discuss how to get up and down off the floor, how to pick things up properly, how to safely get out of a chair without assistance, and balance training.   Meditation and Mindfulness:  -Group instruction provided by verbal instruction, patient participation, and written materials to support subject.  Instructor addresses importance of mindfulness and meditation practice to help reduce stress and improve awareness.  Instructor also leads participants through a meditation exercise.    Stretching for Flexibility and Mobility:  -Group instruction provided by verbal instruction, patient participation, and written materials to support subject.  Instructors lead participants through series of stretches that are designed to increase flexibility thus improving mobility.  These stretches are additional exercise for major muscle groups that are typically performed during regular warm up  and cool down.   Hands Only CPR:  -Group verbal, video, and participation provides a basic overview of AHA guidelines for community CPR. Role-play of emergencies allow participants the opportunity to practice calling for help and chest compression technique with discussion of AED use.   Hypertension: -Group verbal and written instruction that provides a basic overview of hypertension including the most recent diagnostic guidelines, risk factor reduction with self-care instructions and medication management.    Nutrition I class: Heart Healthy Eating:  -Group instruction provided by PowerPoint slides, verbal discussion, and written materials to support subject matter. The instructor gives an explanation and review of the Therapeutic Lifestyle Changes diet recommendations, which includes a discussion on lipid goals, dietary fat, sodium, fiber, plant stanol/sterol esters, sugar, and the components of a well-balanced, healthy diet.   Nutrition II class: Lifestyle Skills:  -Group instruction provided by PowerPoint slides, verbal discussion, and written materials to support subject matter. The instructor gives an explanation and review of label reading, grocery shopping for heart health, heart healthy recipe modifications, and ways to make healthier choices when eating out.   Diabetes Question & Answer:  -Group instruction provided by PowerPoint slides, verbal discussion, and written materials to support subject matter. The instructor gives an explanation and review of diabetes co-morbidities, pre- and post-prandial blood glucose goals, pre-exercise blood glucose goals, signs, symptoms, and treatment of hypoglycemia and hyperglycemia, and foot care basics.   Diabetes Blitz:  -Group instruction provided by PowerPoint slides, verbal discussion, and written materials to support subject matter. The instructor gives an explanation and review of the physiology behind type 1 and type 2 diabetes, diabetes  medications and rational behind using different medications, pre- and post-prandial blood glucose recommendations and Hemoglobin A1c goals, diabetes diet, and exercise including blood glucose guidelines for exercising safely.    Portion Distortion:  -Group instruction provided by PowerPoint slides, verbal discussion, written materials, and food models to support subject matter. The instructor gives an explanation of serving size versus portion size, changes in portions sizes over the last 20 years, and what consists of a serving from each food group.   Stress Management:  -Group instruction provided by verbal instruction, video, and written materials to support subject matter.  Instructors review role of stress in heart disease  and how to cope with stress positively.     Exercising on Your Own:  -Group instruction provided by verbal instruction, power point, and written materials to support subject.  Instructors discuss benefits of exercise, components of exercise, frequency and intensity of exercise, and end points for exercise.  Also discuss use of nitroglycerin and activating EMS.  Review options of places to exercise outside of rehab.  Review guidelines for sex with heart disease.   Cardiac Drugs I:  -Group instruction provided by verbal instruction and written materials to support subject.  Instructor reviews cardiac drug classes: antiplatelets, anticoagulants, beta blockers, and statins.  Instructor discusses reasons, side effects, and lifestyle considerations for each drug class.   Cardiac Drugs II:  -Group instruction provided by verbal instruction and written materials to support subject.  Instructor reviews cardiac drug classes: angiotensin converting enzyme inhibitors (ACE-I), angiotensin II receptor blockers (ARBs), nitrates, and calcium channel blockers.  Instructor discusses reasons, side effects, and lifestyle considerations for each drug class.   Anatomy and Physiology of the  Circulatory System:  Group verbal and written instruction and models provide basic cardiac anatomy and physiology, with the coronary electrical and arterial systems. Review of: AMI, Angina, Valve disease, Heart Failure, Peripheral Artery Disease, Cardiac Arrhythmia, Pacemakers, and the ICD.   Other Education:  -Group or individual verbal, written, or video instructions that support the educational goals of the cardiac rehab program.   Holiday Eating Survival Tips:  -Group instruction provided by PowerPoint slides, verbal discussion, and written materials to support subject matter. The instructor gives patients tips, tricks, and techniques to help them not only survive but enjoy the holidays despite the onslaught of food that accompanies the holidays.   Knowledge Questionnaire Score: Knowledge Questionnaire Score - 12/18/18 0837      Knowledge Questionnaire Score   Pre Score  24/24       Core Components/Risk Factors/Patient Goals at Admission: Personal Goals and Risk Factors at Admission - 12/18/18 0852      Core Components/Risk Factors/Patient Goals on Admission   Improve shortness of breath with ADL's  Yes    Intervention  Provide education, individualized exercise plan and daily activity instruction to help decrease symptoms of SOB with activities of daily living.    Expected Outcomes  Short Term: Improve cardiorespiratory fitness to achieve a reduction of symptoms when performing ADLs;Long Term: Be able to perform more ADLs without symptoms or delay the onset of symptoms    Stress  Yes    Intervention  Offer individual and/or small group education and counseling on adjustment to heart disease, stress management and health-related lifestyle change. Teach and support self-help strategies.;Refer participants experiencing significant psychosocial distress to appropriate mental health specialists for further evaluation and treatment. When possible, include family members and significant  others in education/counseling sessions.    Expected Outcomes  Short Term: Participant demonstrates changes in health-related behavior, relaxation and other stress management skills, ability to obtain effective social support, and compliance with psychotropic medications if prescribed.;Long Term: Emotional wellbeing is indicated by absence of clinically significant psychosocial distress or social isolation.       Core Components/Risk Factors/Patient Goals Review:    Core Components/Risk Factors/Patient Goals at Discharge (Final Review):    ITP Comments: ITP Comments    Row Name 12/18/18 0741           ITP Comments  Medical Director- Dr. Fransico Him, MD          Comments: Adama attended orientation from [place] to  0859 to review rules and guidelines for program. Completed 6 minute walk test, Intitial ITP, and exercise prescription.  VSS. Telemetry-Sinus Rhythm. Gunda experienced some mild chest tightness this resolved with rest. , RN,BSN 12/18/2018 10:50 AM

## 2018-12-20 NOTE — Progress Notes (Signed)
Kara Owens 47 y.o. female DOB: 1971-09-11 MRN: [id number]      Nutrition Note  1. 09/19/18 MV Repair    Past Medical History:  Diagnosis Date  . Arrhythmia    post op atrial fibrillation  . Chest pain    Left sided--sharp pain  . Complication of anesthesia   . Fatigue    Extreme  . Mitral regurgitation    Mild  . Mitral valve prolapse   . Palpitations   . PONV (postoperative nausea and vomiting)   . S/P minimally invasive mitral valve repair 09/19/2018   Artificial Gore-tex neochord placement x10 with 32 mm Sorin Memo 4D ring annuloplasty via right mini thoracotomy approach   Meds reviewed.      Current Outpatient Medications (Analgesics):  .  acetaminophen (TYLENOL) 500 MG tablet, Take 1,000 mg by mouth every 6 (six) hours as needed (for pain). Marland Kitchen  aspirin EC 81 MG EC tablet, Take 1 tablet (81 mg total) by mouth daily.  Current Outpatient Medications (Hematological):  .  warfarin (COUMADIN) 4 MG tablet, Take 4 mg by mouth as directed.  Current Outpatient Medications (Other):  .  amoxicillin (AMOXIL) 500 MG tablet, Take 2 grams 1 hour prior to dental work .  Multiple Vitamins-Minerals (AIRBORNE) CHEW, Chew 1 tablet by mouth 2 (two) times daily as needed ([place]). Marland Kitchen  polyethylene glycol (MIRALAX / GLYCOLAX) packet, Take 17 g by mouth daily.    HT: Ht Readings from Last 1 Encounters:  12/18/18 5\' 8"  (1.727 m)    WT: Wt Readings from Last 5 Encounters:  12/18/18 161 lb 6 oz (73.2 kg)  12/12/18 161 lb (73 kg)  10/15/18 158 lb (71.7 kg)  10/08/18 156 lb (70.8 kg)  09/24/18 152 lb 12.5 oz (69.3 kg)     Body mass index is 24.54 kg/m.   Current tobacco use? No  Labs:  Lipid Panel     Component Value Date/Time   CHOL 175 09/12/2008 0913   TRIG 40 09/12/2008 0913   HDL 57.8 09/12/2008 0913   CHOLHDL 3.0 CALC 09/12/2008 0913   VLDL 8 09/12/2008 0913   LDLCALC 109 (H) 09/12/2008 0913    Lab Results  Component Value Date   HGBA1C 5.5 09/17/2018    CBG (last 3)  No results for input(s): GLUCAP in the last 72 hours.  Nutrition Note Spoke with pt. Nutrition plan and goals reviewed with pt. Pt is following Step 1 of the Therapeutic Lifestyle Changes diet. Pt wants to lose wt. Pt has not been trying to lose wt. Heart healthy and weight loss tips reviewed (label reading, how to build a healthy plate, portion sizes, eating frequently across the day). Pt shared that she has been fatigued since her valve repair in September, and subsequently has been eating out more, eating more convenience foods. Discussed quick and easy heart healthy meals pt could assemble at home. Additionally discussed the importance of planning ahead and using batch cooking and meal prep to help during the week. Per discussion, pt does use canned/convenience foods often. Pt does not add salt to food. Pt does not eat out frequently. Pt expressed understanding of the information reviewed. Pt aware of nutrition education classes offered and would like to attend nutrition classes.  Nutrition Diagnosis ? Food-and nutrition-related knowledge deficit related to lack of exposure to information as related to diagnosis of: ? CVD   Nutrition Intervention ? Pt's individual nutrition plan and goals reviewed with pt. ? Pt given handouts for: ?  Nutrition I class ? Nutrition II class   Nutrition Goal(s):  ? Pt to identify and limit food sources of saturated fat, trans fat, refined carbohydrates and sodium ? Pt to read labels ? Pt able to name foods that affect blood glucose   Plan:  ? Pt to attend nutrition classes ? Nutrition I ? Nutrition II ? Portion Distortion  ? Will provide client-centered nutrition education as part of interdisciplinary care ? Monitor and evaluate progress toward nutrition goal with team.   Laurina Bustle, MS, RD, LDN 12/20/2018 9:48 AM

## 2018-12-24 ENCOUNTER — Ambulatory Visit (HOSPITAL_COMMUNITY): Payer: 59 | Attending: Cardiology

## 2018-12-24 ENCOUNTER — Ambulatory Visit (INDEPENDENT_AMBULATORY_CARE_PROVIDER_SITE_OTHER): Payer: 59 | Admitting: *Deleted

## 2018-12-24 ENCOUNTER — Other Ambulatory Visit: Payer: Self-pay

## 2018-12-24 ENCOUNTER — Encounter (HOSPITAL_COMMUNITY): Payer: 59

## 2018-12-24 ENCOUNTER — Encounter (HOSPITAL_COMMUNITY)
Admission: RE | Admit: 2018-12-24 | Discharge: 2018-12-24 | Disposition: A | Discharge status: Home / Self Care | Payer: 59 | Source: Ambulatory Visit | Attending: Cardiovascular Disease | Admitting: Cardiovascular Disease

## 2018-12-24 DIAGNOSIS — I4891 Unspecified atrial fibrillation: Secondary | ICD-10-CM | POA: Diagnosis not present

## 2018-12-24 DIAGNOSIS — Z5181 Encounter for therapeutic drug level monitoring: Secondary | ICD-10-CM

## 2018-12-24 DIAGNOSIS — R0602 Shortness of breath: Secondary | ICD-10-CM | POA: Diagnosis not present

## 2018-12-24 DIAGNOSIS — Z9889 Other specified postprocedural states: Secondary | ICD-10-CM

## 2018-12-24 LAB — POCT INR: INR: 2.3 (ref 2.0–3.0)

## 2018-12-24 NOTE — Patient Instructions (Signed)
Description   Today take 1.5 tablets then start taking 1 tablet daily daily except 1.5 tablets on Thursdays. Recheck in 11 days.  Stopped amiodarone on 11/8. Call us with any medication changes or concerns to [facility] [phone], Main # [phone].

## 2018-12-24 NOTE — Progress Notes (Signed)
Daily Session Note  Patient Details  Name: Kara Owens MRN: [id number] Date of Birth: 1971-10-07 Referring Provider:     CARDIAC REHAB PHASE II ORIENTATION from 12/18/2018 in [place]  Referring Provider  [name], MD      Encounter Date: 12/24/2018  Check In: Session Check In - 12/24/18 0727      Check-In   Supervising physician immediately available to respond to emergencies  [facility] immediately available    Physician(s)  Dr.Nettey     Location  [facility]    Staff Present  Jiles Garter, RN, BSN;Joann Rion, RN, BSN;Tyara Nevels, MS,ACSM CEP, Exercise Physiologist    Medication changes reported      No    Fall or balance concerns reported     No    Tobacco Cessation  No Change    Warm-up and Cool-down  Performed as group-led instruction    Resistance Training Performed  Yes    VAD Patient?  No    PAD/SET Patient?  No      Pain Assessment   Currently in Pain?  No/denies       Capillary Blood Glucose: No results found for this or any previous visit (from the past 24 hour(s)).  Exercise Prescription Changes - 12/24/18 0800      Response to Exercise   Blood Pressure (Admit)  112/60    Blood Pressure (Exercise)  118/60    Blood Pressure (Exit)  110/62    Heart Rate (Admit)  99 bpm    Heart Rate (Exercise)  110 bpm    Heart Rate (Exit)  93 bpm    Rating of Perceived Exertion (Exercise)  14    Perceived Dyspnea (Exercise)  0    Symptoms  None    Comments  Pt oriented to exercise equipment     Duration  Progress to 30 minutes of  aerobic without signs/symptoms of physical distress    Intensity  THRR unchanged      Progression   Progression  Continue to progress workloads to maintain intensity without signs/symptoms of physical distress.    Average METs  2.97      Resistance Training   Training Prescription  Yes    Weight  3lbs    Reps  10-15    Time  10 Minutes      Treadmill   MPH  2.5    Grade  0    Minutes  10    METs  2.91      Bike   Level  1.2    Minutes  10    METs  4.13      NuStep   Level  3    SPM  75    Minutes  10    METs  1.9       Social History   Tobacco Use  Smoking Status Never Smoker  Smokeless Tobacco Never Used    Goals Met:  Exercise tolerated well  Goals Unmet:  Not Applicable  Comments: Pt started cardiac rehab today.  Pt tolerated light exercise without difficulty. VSS, telemetry-ST, asymptomatic.  Medication list reconciled. Pt denies barriers to medicaiton compliance.  PSYCHOSOCIAL ASSESSMENT:  PHQ-0. Pt exhibits positive coping skills, hopeful outlook with supportive family. No psychosocial needs identified at this time, no psychosocial interventions necessary.  Pt oriented to exercise equipment and routine.    Understanding verbalized.    Dr. Fransico Him is Medical Director for Cardiac Rehab at  [facility].

## 2018-12-28 ENCOUNTER — Encounter (HOSPITAL_COMMUNITY)
Admission: RE | Admit: 2018-12-28 | Discharge: 2018-12-28 | Disposition: A | Discharge status: Home / Self Care | Payer: 59 | Source: Ambulatory Visit | Attending: Cardiovascular Disease | Admitting: Cardiovascular Disease

## 2018-12-28 ENCOUNTER — Encounter (HOSPITAL_COMMUNITY): Payer: 59

## 2018-12-28 DIAGNOSIS — Z7982 Long term (current) use of aspirin: Secondary | ICD-10-CM | POA: Diagnosis not present

## 2018-12-28 DIAGNOSIS — Z79899 Other long term (current) drug therapy: Secondary | ICD-10-CM | POA: Diagnosis not present

## 2018-12-28 DIAGNOSIS — Z9889 Other specified postprocedural states: Secondary | ICD-10-CM | POA: Insufficient documentation

## 2018-12-28 DIAGNOSIS — Z7901 Long term (current) use of anticoagulants: Secondary | ICD-10-CM | POA: Diagnosis not present

## 2018-12-31 ENCOUNTER — Encounter (HOSPITAL_COMMUNITY): Payer: 59

## 2018-12-31 ENCOUNTER — Encounter (HOSPITAL_COMMUNITY)
Admission: RE | Admit: 2018-12-31 | Discharge: 2018-12-31 | Disposition: A | Discharge status: Home / Self Care | Payer: 59 | Source: Ambulatory Visit | Attending: Cardiovascular Disease | Admitting: Cardiovascular Disease

## 2018-12-31 DIAGNOSIS — Z9889 Other specified postprocedural states: Secondary | ICD-10-CM

## 2019-01-02 ENCOUNTER — Encounter (HOSPITAL_COMMUNITY)
Admission: RE | Admit: 2019-01-02 | Discharge: 2019-01-02 | Disposition: A | Discharge status: Home / Self Care | Payer: 59 | Source: Ambulatory Visit | Attending: Cardiovascular Disease | Admitting: Cardiovascular Disease

## 2019-01-02 ENCOUNTER — Encounter (HOSPITAL_COMMUNITY): Payer: 59

## 2019-01-02 DIAGNOSIS — Z9889 Other specified postprocedural states: Secondary | ICD-10-CM

## 2019-01-03 ENCOUNTER — Encounter (HOSPITAL_COMMUNITY): Payer: Self-pay

## 2019-01-03 NOTE — Progress Notes (Signed)
Cardiac Individual Treatment Plan  Patient Details  Name: Kara Owens MRN: [id number] Date of Birth: 1971/04/14 Referring Provider:     CARDIAC REHAB PHASE II ORIENTATION from 12/18/2018 in [place]  Referring Provider  [name], Arnette Norris, MD      Initial Encounter Date:    CARDIAC REHAB PHASE II ORIENTATION from 12/18/2018 in [place]  Date  12/18/18      Visit Diagnosis: 09/19/18 MV Repair  Patient's Home Medications on Admission:  Current Outpatient Medications:  .  acetaminophen (TYLENOL) 500 MG tablet, Take 1,000 mg by mouth every 6 (six) hours as needed (for pain)., Disp: , Rfl:  .  amoxicillin (AMOXIL) 500 MG tablet, Take 2 grams 1 hour prior to dental work, Disp: 4 tablet, Rfl: 2 .  aspirin EC 81 MG EC tablet, Take 1 tablet (81 mg total) by mouth daily., Disp: , Rfl:  .  Multiple Vitamins-Minerals (AIRBORNE) CHEW, Chew 1 tablet by mouth 2 (two) times daily as needed ([place])., Disp: , Rfl:  .  polyethylene glycol (MIRALAX / GLYCOLAX) packet, Take 17 g by mouth daily. , Disp: , Rfl:  .  warfarin (COUMADIN) 4 MG tablet, Take 4 mg by mouth as directed., Disp: , Rfl:   Past Medical History: Past Medical History:  Diagnosis Date  . Arrhythmia    post op atrial fibrillation  . Chest pain    Left sided--sharp pain  . Complication of anesthesia   . Fatigue    Extreme  . Mitral regurgitation    Mild  . Mitral valve prolapse   . Palpitations   . PONV (postoperative nausea and vomiting)   . S/P minimally invasive mitral valve repair 09/19/2018   Artificial Gore-tex neochord placement x10 with 32 mm Sorin Memo 4D ring annuloplasty via right mini thoracotomy approach    Tobacco Use: Social History   Tobacco Use  Smoking Status Never Smoker  Smokeless Tobacco Never Used    Labs: Recent Review Flowsheet Data    Labs for ITP Cardiac and Pulmonary Rehab Latest Ref Rng & Units 09/19/2018 09/19/2018  09/19/2018 09/19/2018 09/19/2018   Cholestrol 0 - 200 mg/dL - - - - -   LDLCALC 0 - 99 mg/dL - - - - -   HDL >39.0 mg/dL - - - - -   Trlycerides 0 - 149 mg/dL - - - - -   Hemoglobin A1c 4.8 - 5.6 % - - - - -   PHART 7.350 - 7.450 7.351 - 7.230(L) 7.308(L) -   PCO2ART 32.0 - 48.0 mmHg 37.5 - 50.6(H) 45.6 -   HCO3 20.0 - 28.0 mmol/L 20.7 - 21.5 22.8 -   TCO2 22 - 32 mmol/L 22 21(L) 23 24 24    ACIDBASEDEF 0.0 - 2.0 mmol/L 4.0(H) - 6.0(H) 3.0(H) -   O2SAT % 100.0 - 99.0 98.0 -      Capillary Blood Glucose: Lab Results  Component Value Date   GLUCAP 107 (H) 09/21/2018   GLUCAP 103 (H) 09/20/2018   GLUCAP 125 (H) 09/20/2018   GLUCAP 110 (H) 09/20/2018   GLUCAP 98 09/20/2018     Exercise Target Goals: Exercise Program Goal: Individual exercise prescription set using results from initial 6 min walk test and THRR while considering  patient's activity barriers and safety.   Exercise Prescription Goal: Initial exercise prescription builds to 30-45 minutes a day of aerobic activity, 2-3 days per week.  Home exercise guidelines will be given to  patient during program as part of exercise prescription that the participant will acknowledge.  Activity Barriers & Risk Stratification: Activity Barriers & Cardiac Risk Stratification - 12/18/18 0848      Activity Barriers & Cardiac Risk Stratification   Activity Barriers  None    Cardiac Risk Stratification  Moderate       6 Minute Walk: 6 Minute Walk    Row Name 12/18/18 0810         6 Minute Walk   Phase  Initial     Distance  1283 feet     Walk Time  6 minutes     # of Rest Breaks  0     MPH  2.43     METS  4.04     RPE  7     Perceived Dyspnea   0     VO2 Peak  14.14     Symptoms  Yes (comment)     Comments  Patient c/o slight chest tightness during walk test, which resolved with rest.     Resting HR  90 bpm     Resting BP  104/62     Resting Oxygen Saturation   98 %     Exercise Oxygen Saturation  during 6 min walk  99 %      Max Ex. HR  100 bpm     Max Ex. BP  108/64     2 Minute Post BP  104/70        Oxygen Initial Assessment:   Oxygen Re-Evaluation:   Oxygen Discharge (Final Oxygen Re-Evaluation):   Initial Exercise Prescription: Initial Exercise Prescription - 12/18/18 0900      Date of Initial Exercise RX and Referring Provider   Date  12/18/18    Referring Provider  [name], MD    Expected Discharge Date  02/22/19      Treadmill   MPH  2.5    Grade  0    Minutes  10    METs  2.91      Bike   Level  1.2    Minutes  10    METs  4.13      NuStep   Level  3    SPM  85    Minutes  10    METs  2.9      Prescription Details   Frequency (times per week)  3    Duration  Progress to 30 minutes of continuous aerobic without signs/symptoms of physical distress      Intensity   THRR 40-80% of Max Heartrate  68-137    Ratings of Perceived Exertion  11-13    Perceived Dyspnea  0-4      Progression   Progression  Continue to progress workloads to maintain intensity without signs/symptoms of physical distress.      Resistance Training   Training Prescription  Yes    Weight  3lbs    Reps  10-15       Perform Capillary Blood Glucose checks as needed.  Exercise Prescription Changes: Exercise Prescription Changes    Row Name 12/24/18 0800 01/02/19 1000           Response to Exercise   Blood Pressure (Admit)  112/60  112/79      Blood Pressure (Exercise)  118/60  128/68      Blood Pressure (Exit)  110/62  108/80      Heart Rate (Admit)  99 bpm  85 bpm  Heart Rate (Exercise)  110 bpm  131 bpm      Heart Rate (Exit)  93 bpm  95 bpm      Rating of Perceived Exertion (Exercise)  14  13      Perceived Dyspnea (Exercise)  0  0      Symptoms  None  None      Comments  Pt oriented to exercise equipment   None      Duration  Progress to 30 minutes of  aerobic without signs/symptoms of physical distress  Continue with 30 min of aerobic exercise without signs/symptoms of  physical distress.      Intensity  THRR unchanged  THRR unchanged        Progression   Progression  Continue to progress workloads to maintain intensity without signs/symptoms of physical distress.  Continue to progress workloads to maintain intensity without signs/symptoms of physical distress.      Average METs  2.97  3.24        Resistance Training   Training Prescription  Yes  No      Weight  3lbs  -      Reps  10-15  -      Time  10 Minutes  -        Treadmill   MPH  2.5  2.5      Grade  0  0      Minutes  10  10      METs  2.91  2.91        Bike   Level  1.2  1.2      Minutes  10  10      METs  4.13  4.13        NuStep   Level  3  3      SPM  75  85      Minutes  10  10      METs  1.9  2.7         Exercise Comments: Exercise Comments    Row Name 12/24/18 1525           Exercise Comments  Pt's first day of exercise. Pt oriented to exercise equipment. Pt responded well to exercise workloads. Will continue to monitor and progress pt as tolerated.           Exercise Goals and Review: Exercise Goals    Row Name 12/18/18 0849             Exercise Goals   Increase Physical Activity  Yes       Intervention  Provide advice, education, support and counseling about physical activity/exercise needs.;Develop an individualized exercise prescription for aerobic and resistive training based on initial evaluation findings, risk stratification, comorbidities and participant's personal goals.       Expected Outcomes  Short Term: Attend rehab on a regular basis to increase amount of physical activity.;Long Term: Add in home exercise to make exercise part of routine and to increase amount of physical activity.;Long Term: Exercising regularly at least 3-5 days a week.       Increase Strength and Stamina  Yes       Intervention  Provide advice, education, support and counseling about physical activity/exercise needs.;Develop an individualized exercise prescription for aerobic  and resistive training based on initial evaluation findings, risk stratification, comorbidities and participant's personal goals.       Expected Outcomes  Short Term: Increase workloads from initial exercise prescription for  resistance, speed, and METs.;Short Term: Perform resistance training exercises routinely during rehab and add in resistance training at home;Long Term: Improve cardiorespiratory fitness, muscular endurance and strength as measured by increased METs and functional capacity (6MWT)       Able to understand and use rate of perceived exertion (RPE) scale  Yes       Intervention  Provide education and explanation on how to use RPE scale       Expected Outcomes  Short Term: Able to use RPE daily in rehab to express subjective intensity level;Long Term:  Able to use RPE to guide intensity level when exercising independently       Knowledge and understanding of Target Heart Rate Range (THRR)  Yes       Intervention  Provide education and explanation of THRR including how the numbers were predicted and where they are located for reference       Expected Outcomes  Short Term: Able to state/look up THRR;Long Term: Able to use THRR to govern intensity when exercising independently;Short Term: Able to use daily as guideline for intensity in rehab       Able to check pulse independently  Yes       Intervention  Provide education and demonstration on how to check pulse in carotid and radial arteries.;Review the importance of being able to check your own pulse for safety during independent exercise       Expected Outcomes  Short Term: Able to explain why pulse checking is important during independent exercise;Long Term: Able to check pulse independently and accurately       Understanding of Exercise Prescription  Yes       Intervention  Provide education, explanation, and written materials on patient's individual exercise prescription       Expected Outcomes  Short Term: Able to explain program  exercise prescription;Long Term: Able to explain home exercise prescription to exercise independently          Exercise Goals Re-Evaluation :   Discharge Exercise Prescription (Final Exercise Prescription Changes): Exercise Prescription Changes - 01/02/19 1000      Response to Exercise   Blood Pressure (Admit)  112/79    Blood Pressure (Exercise)  128/68    Blood Pressure (Exit)  108/80    Heart Rate (Admit)  85 bpm    Heart Rate (Exercise)  131 bpm    Heart Rate (Exit)  95 bpm    Rating of Perceived Exertion (Exercise)  13    Perceived Dyspnea (Exercise)  0    Symptoms  None    Comments  None    Duration  Continue with 30 min of aerobic exercise without signs/symptoms of physical distress.    Intensity  THRR unchanged      Progression   Progression  Continue to progress workloads to maintain intensity without signs/symptoms of physical distress.    Average METs  3.24      Resistance Training   Training Prescription  No      Treadmill   MPH  2.5    Grade  0    Minutes  10    METs  2.91      Bike   Level  1.2    Minutes  10    METs  4.13      NuStep   Level  3    SPM  85    Minutes  10    METs  2.7       Nutrition:  Target Goals: Understanding of nutrition guidelines, daily intake of sodium 1500mg , cholesterol 200mg , calories 30% from fat and 7% or less from saturated fats, daily to have 5 or more servings of fruits and vegetables.  Biometrics: Pre Biometrics - 12/18/18 0745      Pre Biometrics   Height  5\' 8"  (1.727 m)    Weight  73.2 kg    Waist Circumference  33 inches    Hip Circumference  42.5 inches    Waist to Hip Ratio  0.78 %    BMI (Calculated)  24.54    Triceps Skinfold  35 mm    % Body Fat  35.9 %    Grip Strength  30 kg    Flexibility  16.5 in    Single Leg Stand  30 seconds        Nutrition Therapy Plan and Nutrition Goals: Nutrition Therapy & Goals - 12/18/18 1248      Nutrition Therapy   Diet  heart healthy      Personal  Nutrition Goals   Nutrition Goal  Pt to identify and limit food sources of saturated fat, trans fat, refined carbohydrates and sodium    Personal Goal #2  Pt to read labels    Personal Goal #3  Pt able to name foods that affect blood glucose      Intervention Plan   Intervention  Prescribe, educate and counsel regarding individualized specific dietary modifications aiming towards targeted core components such as weight, hypertension, lipid management, diabetes, heart failure and other comorbidities.    Expected Outcomes  Short Term Goal: Understand basic principles of dietary content, such as calories, fat, sodium, cholesterol and nutrients.;Long Term Goal: Adherence to prescribed nutrition plan.       Nutrition Assessments: Nutrition Assessments - 12/20/18 1058      MEDFICTS Scores   Pre Score  32       Nutrition Goals Re-Evaluation:   Nutrition Goals Re-Evaluation:   Nutrition Goals Discharge (Final Nutrition Goals Re-Evaluation):   Psychosocial: Target Goals: Acknowledge presence or absence of significant depression and/or stress, maximize coping skills, provide positive support system. Participant is able to verbalize types and ability to use techniques and skills needed for reducing stress and depression.  Initial Review & Psychosocial Screening: Initial Psych Review & Screening - 12/18/18 1033      Initial Review   Current issues with  None Identified      Family Dynamics   Good Support System?  Yes   Kamber has her husband for support     Barriers   Psychosocial barriers to participate in program  There are no identifiable barriers or psychosocial needs.      Screening Interventions   Interventions  Encouraged to exercise       Quality of Life Scores: Quality of Life - 12/18/18 0835      Quality of Life   Select  Quality of Life      Quality of Life Scores   Health/Function Pre  26.4 %    Socioeconomic Pre  26.43 %    Psych/Spiritual Pre  29.14 %     Family Pre  28.8 %    GLOBAL Pre  27.32 %      Scores of 19 and below usually indicate a poorer quality of life in these areas.  A difference of  2-3 points is a clinically meaningful difference.  A difference of 2-3 points in the total score of the Quality of Life Index has  been associated with significant improvement in overall quality of life, self-image, physical symptoms, and general health in studies assessing change in quality of life.  PHQ-9: Recent Review Flowsheet Data    Depression screen [facility] 2/9 12/24/2018   Decreased Interest 0   Down, Depressed, Hopeless 0   PHQ - 2 Score 0     Interpretation of Total Score  Total Score Depression Severity:  1-4 = Minimal depression, 5-9 = Mild depression, 10-14 = Moderate depression, 15-19 = Moderately severe depression, 20-27 = Severe depression   Psychosocial Evaluation and Intervention: Psychosocial Evaluation - 12/24/18 0812      Psychosocial Evaluation & Interventions   Interventions  Encouraged to exercise with the program and follow exercise prescription;Stress management education;Relaxation education    Comments  No psychosocial interventions necessary.    Expected Outcomes  Lakisa will maintain a positive outlook with good coping skills.     Continue Psychosocial Services   No Follow up required       Psychosocial Re-Evaluation: Psychosocial Re-Evaluation    [place] Name 12/28/18 [phone]             Psychosocial Re-Evaluation   Current issues with  None Identified       Comments  No identifiable pyschosocial needs.       Expected Outcomes  Jilleen will maintain a positive outlook and utilize good coping skills.        Interventions  Encouraged to attend Cardiac Rehabilitation for the exercise       Continue Psychosocial Services   No Follow up required          Psychosocial Discharge (Final Psychosocial Re-Evaluation): Psychosocial Re-Evaluation - 12/28/18 0857      Psychosocial Re-Evaluation   Current issues with   None Identified    Comments  No identifiable pyschosocial needs.    Expected Outcomes  Marylynne will maintain a positive outlook and utilize good coping skills.     Interventions  Encouraged to attend Cardiac Rehabilitation for the exercise    Continue Psychosocial Services   No Follow up required       Vocational Rehabilitation: Provide vocational rehab assistance to qualifying candidates.   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 Participant will state understanding/return demonstration of topics presented.  Learning Barriers/Preferences: Learning Barriers/Preferences - 12/18/18 1034      Learning Barriers/Preferences   Learning Barriers  Sight   wears contacts   Learning Preferences  Pictoral;Skilled Demonstration;Video       Education Topics: Count Your Pulse:  -Group instruction provided by verbal instruction, demonstration, patient participation and written materials to support subject.  Instructors address importance of being able to find your pulse and how to count your pulse when at home without a heart monitor.  Patients get hands on experience counting their pulse with staff help and individually.   Heart Attack, Angina, and Risk Factor Modification:  -Group instruction provided by verbal instruction, video, and written materials to support subject.  Instructors address signs and symptoms of angina and heart attacks.    Also discuss risk factors for heart disease and how to make changes to improve heart health risk  factors.   Functional Fitness:  -Group instruction provided by verbal instruction, demonstration, patient  participation, and written materials to support subject.  Instructors address safety measures for doing things around the house.  Discuss how to get up and down off the floor, how to pick things up properly, how to safely get out of a chair without assistance, and balance training.   Meditation and Mindfulness:  -Group instruction provided by verbal instruction, patient participation, and written materials to support subject.  Instructor addresses importance of mindfulness and meditation practice to help reduce stress and improve awareness.  Instructor also leads participants through a meditation exercise.    Stretching for Flexibility and Mobility:  -Group instruction provided by verbal instruction, patient participation, and written materials to support subject.  Instructors lead participants through series of stretches that are designed to increase flexibility thus improving mobility.  These stretches are additional exercise for major muscle groups that are typically performed during regular warm up and cool down.   Hands Only CPR:  -Group verbal, video, and participation provides a basic overview of AHA guidelines for community CPR. Role-play of emergencies allow participants the opportunity to practice calling for help and chest compression technique with discussion of AED use.   Hypertension: -Group verbal and written instruction that provides a basic overview of hypertension including the most recent diagnostic guidelines, risk factor reduction with self-care instructions and medication management.    Nutrition I class: Heart Healthy Eating:  -Group instruction provided by PowerPoint slides, verbal discussion, and written materials to support subject matter. The instructor gives an explanation and review of the Therapeutic Lifestyle Changes diet recommendations, which includes a discussion on lipid goals, dietary fat, sodium, fiber, plant stanol/sterol esters, sugar, and the components of  a well-balanced, healthy diet.   Nutrition II class: Lifestyle Skills:  -Group instruction provided by PowerPoint slides, verbal discussion, and written materials to support subject matter. The instructor gives an explanation and review of label reading, grocery shopping for heart health, heart healthy recipe modifications, and ways to make healthier choices when eating out.   Diabetes Question & Answer:  -Group instruction provided by PowerPoint slides, verbal discussion, and written materials to support subject matter. The instructor gives an explanation and review of diabetes co-morbidities, pre- and post-prandial blood glucose goals, pre-exercise blood glucose goals, signs, symptoms, and treatment of hypoglycemia and hyperglycemia, and foot care basics.   Diabetes Blitz:  -Group instruction provided by PowerPoint slides, verbal discussion, and written materials to support subject matter. The instructor gives an explanation and review of the physiology behind type 1 and type 2 diabetes, diabetes medications and rational behind using different medications, pre- and post-prandial blood glucose recommendations and Hemoglobin A1c goals, diabetes diet, and exercise including blood glucose guidelines for exercising safely.    Portion Distortion:  -Group instruction provided by PowerPoint slides, verbal discussion, written materials, and food models to support subject matter. The instructor gives an explanation of serving size versus portion size, changes in portions sizes over the last 20 years, and what consists of a serving from each food group.   Stress Management:  -Group instruction provided by verbal instruction, video, and written materials to support subject matter.  Instructors review role of stress in heart disease and how to cope with stress positively.     Exercising on Your Own:  -Group instruction provided by verbal instruction, power point, and written materials to support  subject.  Instructors discuss benefits of exercise, components of exercise, frequency and intensity of  exercise, and end points for exercise.  Also discuss use of nitroglycerin and activating EMS.  Review options of places to exercise outside of rehab.  Review guidelines for sex with heart disease.   Cardiac Drugs I:  -Group instruction provided by verbal instruction and written materials to support subject.  Instructor reviews cardiac drug classes: antiplatelets, anticoagulants, beta blockers, and statins.  Instructor discusses reasons, side effects, and lifestyle considerations for each drug class.   Cardiac Drugs II:  -Group instruction provided by verbal instruction and written materials to support subject.  Instructor reviews cardiac drug classes: angiotensin converting enzyme inhibitors (ACE-I), angiotensin II receptor blockers (ARBs), nitrates, and calcium channel blockers.  Instructor discusses reasons, side effects, and lifestyle considerations for each drug class.   Anatomy and Physiology of the Circulatory System:  Group verbal and written instruction and models provide basic cardiac anatomy and physiology, with the coronary electrical and arterial systems. Review of: AMI, Angina, Valve disease, Heart Failure, Peripheral Artery Disease, Cardiac Arrhythmia, Pacemakers, and the ICD.   Other Education:  -Group or individual verbal, written, or video instructions that support the educational goals of the cardiac rehab program.   Holiday Eating Survival Tips:  -Group instruction provided by PowerPoint slides, verbal discussion, and written materials to support subject matter. The instructor gives patients tips, tricks, and techniques to help them not only survive but enjoy the holidays despite the onslaught of food that accompanies the holidays.   Knowledge Questionnaire Score: Knowledge Questionnaire Score - 12/18/18 0837      Knowledge Questionnaire Score   Pre Score  24/24        Core Components/Risk Factors/Patient Goals at Admission: Personal Goals and Risk Factors at Admission - 12/18/18 0852      Core Components/Risk Factors/Patient Goals on Admission   Improve shortness of breath with ADL's  Yes    Intervention  Provide education, individualized exercise plan and daily activity instruction to help decrease symptoms of SOB with activities of daily living.    Expected Outcomes  Short Term: Improve cardiorespiratory fitness to achieve a reduction of symptoms when performing ADLs;Long Term: Be able to perform more ADLs without symptoms or delay the onset of symptoms    Stress  Yes    Intervention  Offer individual and/or small group education and counseling on adjustment to heart disease, stress management and health-related lifestyle change. Teach and support self-help strategies.;Refer participants experiencing significant psychosocial distress to appropriate mental health specialists for further evaluation and treatment. When possible, include family members and significant others in education/counseling sessions.    Expected Outcomes  Short Term: Participant demonstrates changes in health-related behavior, relaxation and other stress management skills, ability to obtain effective social support, and compliance with psychotropic medications if prescribed.;Long Term: Emotional wellbeing is indicated by absence of clinically significant psychosocial distress or social isolation.       Core Components/Risk Factors/Patient Goals Review:  Goals and Risk Factor Review    Row Name 12/24/18 0823 12/28/18 0858           Core Components/Risk Factors/Patient Goals Review   Personal Goals Review  Stress;Improve shortness of breath with ADL's  Stress;Improve shortness of breath with ADL's      Review  Pt with few CAD RFs willing to participate in CR exercise.  Pt would like to increase stamina and decrease her SOB and weight.     Pt with few CAD RFs willing to participate  in CR exercise.  Jylian has started exercise recently and is  tolerating it well. VSS.      Expected Outcomes  Pt will continue to participate in CR exercise, nutrition, and lifestyle modification opportunities.   Pt will continue to participate in CR exercise, nutrition, and lifestyle modification opportunities.          Core Components/Risk Factors/Patient Goals at Discharge (Final Review):  Goals and Risk Factor Review - 12/28/18 0858      Core Components/Risk Factors/Patient Goals Review   Personal Goals Review  Stress;Improve shortness of breath with ADL's    Review  Pt with few CAD RFs willing to participate in CR exercise.  Anagabriela has started exercise recently and is tolerating it well. VSS.    Expected Outcomes  Pt will continue to participate in CR exercise, nutrition, and lifestyle modification opportunities.        ITP Comments: ITP Comments    Row Name 12/18/18 0741 12/24/18 0801         ITP Comments  Medical Director- Dr. Fransico Him, MD  30 Day ITP Review. Pt started exercise today and tolerated it well.          Comments: See ITP Comments.

## 2019-01-04 ENCOUNTER — Encounter (HOSPITAL_COMMUNITY)
Admission: RE | Admit: 2019-01-04 | Discharge: 2019-01-04 | Disposition: A | Discharge status: Home / Self Care | Payer: 59 | Source: Ambulatory Visit | Attending: Cardiovascular Disease | Admitting: Cardiovascular Disease

## 2019-01-04 ENCOUNTER — Ambulatory Visit (INDEPENDENT_AMBULATORY_CARE_PROVIDER_SITE_OTHER): Payer: 59 | Admitting: *Deleted

## 2019-01-04 ENCOUNTER — Encounter (HOSPITAL_COMMUNITY): Payer: 59

## 2019-01-04 DIAGNOSIS — I4891 Unspecified atrial fibrillation: Secondary | ICD-10-CM

## 2019-01-04 DIAGNOSIS — Z9889 Other specified postprocedural states: Secondary | ICD-10-CM | POA: Diagnosis not present

## 2019-01-04 DIAGNOSIS — Z5181 Encounter for therapeutic drug level monitoring: Secondary | ICD-10-CM | POA: Diagnosis not present

## 2019-01-04 LAB — POCT INR: INR: 3.4 — AB (ref 2.0–3.0)

## 2019-01-04 NOTE — Patient Instructions (Signed)
Description   Today take 1/2 tablet, then continue  taking 1 tablet daily daily except 1.5 tablets on Thursdays. Increase leafy green vegetable intake to 2 servings each week.  Recheck in 14 days.  Stopped amiodarone on 11/8. Call us with any medication changes or concerns to [facility] [phone], Main # [phone].

## 2019-01-07 ENCOUNTER — Encounter (HOSPITAL_COMMUNITY)
Admission: RE | Admit: 2019-01-07 | Discharge: 2019-01-07 | Disposition: A | Discharge status: Home / Self Care | Payer: 59 | Source: Ambulatory Visit | Attending: Cardiovascular Disease | Admitting: Cardiovascular Disease

## 2019-01-07 ENCOUNTER — Encounter (HOSPITAL_COMMUNITY): Payer: 59

## 2019-01-07 DIAGNOSIS — Z9889 Other specified postprocedural states: Secondary | ICD-10-CM

## 2019-01-09 ENCOUNTER — Encounter (HOSPITAL_COMMUNITY): Payer: 59

## 2019-01-09 ENCOUNTER — Encounter (HOSPITAL_COMMUNITY)
Admission: RE | Admit: 2019-01-09 | Discharge: 2019-01-09 | Disposition: A | Discharge status: Home / Self Care | Payer: 59 | Source: Ambulatory Visit | Attending: Cardiovascular Disease | Admitting: Cardiovascular Disease

## 2019-01-09 DIAGNOSIS — Z9889 Other specified postprocedural states: Secondary | ICD-10-CM

## 2019-01-11 ENCOUNTER — Encounter (HOSPITAL_COMMUNITY)
Admission: RE | Admit: 2019-01-11 | Discharge: 2019-01-11 | Disposition: A | Discharge status: Home / Self Care | Payer: 59 | Source: Ambulatory Visit | Attending: Cardiovascular Disease | Admitting: Cardiovascular Disease

## 2019-01-11 ENCOUNTER — Encounter (HOSPITAL_COMMUNITY): Payer: 59

## 2019-01-11 DIAGNOSIS — Z9889 Other specified postprocedural states: Secondary | ICD-10-CM

## 2019-01-14 ENCOUNTER — Ambulatory Visit: Payer: 59 | Admitting: Thoracic Surgery (Cardiothoracic Vascular Surgery)

## 2019-01-14 ENCOUNTER — Other Ambulatory Visit: Payer: Self-pay

## 2019-01-14 ENCOUNTER — Encounter (HOSPITAL_COMMUNITY): Payer: 59

## 2019-01-14 ENCOUNTER — Encounter (HOSPITAL_COMMUNITY)
Admission: RE | Admit: 2019-01-14 | Discharge: 2019-01-14 | Disposition: A | Discharge status: Home / Self Care | Payer: 59 | Source: Ambulatory Visit | Attending: Cardiovascular Disease | Admitting: Cardiovascular Disease

## 2019-01-14 ENCOUNTER — Encounter: Payer: Self-pay | Admitting: Thoracic Surgery (Cardiothoracic Vascular Surgery)

## 2019-01-14 VITALS — BP 100/80 | HR 96 | Resp 18 | Ht 68.0 in | Wt 161.0 lb

## 2019-01-14 DIAGNOSIS — Z9889 Other specified postprocedural states: Secondary | ICD-10-CM

## 2019-01-14 NOTE — Progress Notes (Signed)
[address]       [place] [address]             [phone]     CARDIOTHORACIC SURGERY OFFICE NOTE  Referring Provider is [name], [name], MD PCP is [name], MD   HPI:  Patient is a 48 year old female who returns to the office today for routine follow-up status post minimally invasive mitral valve repair on September 19, 2018 for mitral valve prolapse with severe symptomatic primary mitral regurgitation.  Prior to surgery she presented with frequent palpitations associated with episodes of nonsustained ventricular tachycardia on a cardiac event monitor and mild symptoms of exertional shortness of breath.  Postoperatively she has done very well.  She was seen in follow-up by Dr. [name] last month and routine follow-up echocardiogram demonstrated normal left ventricular systolic function with intact mitral valve repair and no residual mitral regurgitation.  She returns to our office today and reports that she is doing very well.  She no longer has any pain in her chest.  She states that her right chest wall just feels numb.  Activity level is quite good.  She reports an occasional palpitation but nothing like she had prior to surgery.  Overall she is pleased with her progress.   Current Outpatient Medications  Medication Sig Dispense Refill  . acetaminophen (TYLENOL) 500 MG tablet Take 1,000 mg by mouth every 6 (six) hours as needed (for pain).    Marland Kitchen amoxicillin (AMOXIL) 500 MG tablet Take 2 grams 1 hour prior to dental work 4 tablet 2  . aspirin EC 81 MG EC tablet Take 1 tablet (81 mg total) by mouth daily.    . Multiple Vitamins-Minerals (AIRBORNE) CHEW Chew 1 tablet by mouth 2 (two) times daily as needed (FOR IMMUNE HEALTH).    Marland Kitchen polyethylene glycol (MIRALAX / GLYCOLAX) packet Take 17 g by mouth daily.     Marland Kitchen warfarin (COUMADIN) 4 MG tablet Take 4 mg by mouth as directed.     No current facility-administered medications for this visit.       Physical  Exam:   BP 100/80 (BP Location: Left Arm, Patient Position: Sitting, Cuff Size: Normal)   Pulse 96   Resp 18   Ht 5\' 8"  (1.727 m)   Wt 161 lb (73 kg)   SpO2 97% Comment: RA  BMI 24.48 kg/m   General:  Well-appearing  Chest:   Clear to auscultation  CV:   Regular rate and rhythm without murmur  Incisions:  Completely healed  Abdomen:  Soft nontender  Extremities:  Warm and well-perfused  Diagnostic Tests:  Transthoracic Echocardiography  Patient:    Bernardette, Waldron MR #:       [id number] Study Date: 12/24/2018 Gender:     F Age:        11 Height:     172.7 cm Weight:     73.2 kg BSA:        1.88 m^2 Pt. Status: Room:   REFERRING    [name], M.D.  ATTENDING    Kirk Ruths  PERFORMING   [facility], Outpatient  SONOGRAPHER  [facility], RDCS  ORDERING     [name], Jr  REFERRING    [name], Jr  cc:  ------------------------------------------------------------------- LV EF: 50% -   55%  ------------------------------------------------------------------- Indications:      Mitral Valve Disease (I05.9).  ------------------------------------------------------------------- History:   PMH:  Status Post minimally invasive Mitral Valve Repair (09-19-18) with post-op Atrial Fibrillation.  Chest pain.  Dyspnea. Atrial fibrillation.  Mitral valve disease.  Mitral valve prolapse.  Risk factors:  Family history of coronary artery disease.  ------------------------------------------------------------------- Study Conclusions  - Left ventricle: The cavity size was normal. Wall thickness was   normal. Systolic function was normal. The estimated ejection   fraction was in the range of 50% to 55%. Wall motion was normal;   there were no regional wall motion abnormalities. The study is   not technically sufficient to allow evaluation of LV diastolic   function. - Mitral valve: Prior procedures included surgical repair.  Impressions:  - Normal LV function; s/p MV  repair with normal mean gradient and   no MR.  ------------------------------------------------------------------- Study data:  Comparison was made to the study of 09/19/2018.  Study status:  Routine.  Procedure:  Transthoracic echocardiography. Image quality was adequate.          Transthoracic echocardiography.  M-mode, complete 2D, spectral Doppler, and color Doppler.  Birthdate:  Patient birthdate: [birth year].  Age:  Patient is 48 yr old.  Sex:  Gender: female.    BMI: 24.5 kg/m^2.  Blood pressure:     122/90  Patient status:  Outpatient.  Study date: Study date: 12/24/2018. Study time: 02:08 PM.  Location:  [facility]  -------------------------------------------------------------------  ------------------------------------------------------------------- Left ventricle:  The cavity size was normal. Wall thickness was normal. Systolic function was normal. The estimated ejection fraction was in the range of 50% to 55%. Wall motion was normal; there were no regional wall motion abnormalities. The study is not technically sufficient to allow evaluation of LV diastolic function.  ------------------------------------------------------------------- Aortic valve:   Trileaflet; mildly thickened leaflets. Mobility was not restricted.  Doppler:  Transvalvular velocity was within the normal range. There was no stenosis. There was no regurgitation.   ------------------------------------------------------------------- Aorta:  Aortic root: The aortic root was normal in size.  ------------------------------------------------------------------- Mitral valve:  Prior procedures included surgical repair. Mobility was not restricted.  Doppler:  Transvalvular velocity was within the normal range. There was no evidence for stenosis. There was no regurgitation.    Valve area by pressure half-time: 3.06 cm^2. Indexed valve area by pressure half-time: 1.63 cm^2/m^2. Indexed valve area by  continuity equation (using LVOT flow): 0.83 cm^2/m^2.    Mean gradient (D): 3 mm Hg. Peak gradient (D): 7 mm Hg.  ------------------------------------------------------------------- Left atrium:  The atrium was normal in size.  ------------------------------------------------------------------- Right ventricle:  The cavity size was normal. Systolic function was normal.  ------------------------------------------------------------------- Pulmonic valve:    Doppler:  Transvalvular velocity was within the normal range. There was no evidence for stenosis.  ------------------------------------------------------------------- Tricuspid valve:   Structurally normal valve.    Doppler: Transvalvular velocity was within the normal range. There was trivial regurgitation.  ------------------------------------------------------------------- Pulmonary artery:   Systolic pressure was within the normal range.   ------------------------------------------------------------------- Right atrium:  The atrium was normal in size.  ------------------------------------------------------------------- Pericardium:  There was no pericardial effusion.  ------------------------------------------------------------------- Systemic veins: Inferior vena cava: The vessel was normal in size.  ------------------------------------------------------------------- Measurements   Left ventricle                           Value          Reference  LV ID, ED, PLAX chordal                  44.2  mm       43 - 52  LV ID, ES, PLAX chordal                  26.1  mm       23 - 38  LV fx shortening, PLAX chordal           41    %        >=29  LV PW thickness, ED                      7.81  mm       ----------  IVS/LV PW ratio, ED                      1.22           <=1.3  Stroke volume, 2D                        56    ml       ----------  Stroke volume/bsa, 2D                    30    ml/m^2   ----------  LV e&',  lateral                           11    cm/s     ----------  LV E/e&', lateral                         12.36          ----------  LV e&', medial                            8.27  cm/s     ----------  LV E/e&', medial                          16.44          ----------  LV e&', average                           9.64  cm/s     ----------  LV E/e&', average                         14.12          ----------    Ventricular septum                       Value          Reference  IVS thickness, ED                        9.5   mm       ----------    LVOT                                     Value          Reference  LVOT ID, S  21    mm       ----------  LVOT area                                3.46  cm^2     ----------  LVOT peak velocity, S                    80.2  cm/s     ----------  LVOT mean velocity, S                    51.5  cm/s     ----------  LVOT VTI, S                              16.2  cm       ----------    Aorta                                    Value          Reference  Aortic root ID, ED                       33    mm       ----------  Ascending aorta ID, A-P, S               36    mm       ----------    Left atrium                              Value          Reference  LA ID, A-P, ES                           31    mm       ----------  LA ID/bsa, A-P                           1.65  cm/m^2   <=2.2  LA volume, S                             44.9  ml       ----------  LA volume/bsa, S                         23.9  ml/m^2   ----------  LA volume, ES, 1-p A4C                   47.6  ml       ----------  LA volume/bsa, ES, 1-p A4C               25.3  ml/m^2   ----------  LA volume, ES, 1-p A2C                   40.7  ml       ----------  LA volume/bsa, ES, 1-p A2C               21.6  ml/m^2   ----------  Mitral valve                             Value          Reference  Mitral E-wave peak velocity              136   cm/s     ----------  Mitral A-wave  peak velocity              121   cm/s     ----------  Mitral mean velocity, D                  81.4  cm/s     ----------  Mitral deceleration time         (H)     310   ms       150 - 230  Mitral pressure half-time                72    ms       ----------  Mitral mean gradient, D                  3     mm Hg    ----------  Mitral peak gradient, D                  7     mm Hg    ----------  Mitral E/A ratio, peak                   1.1            ----------  Mitral valve area, PHT, DP               3.06  cm^2     ----------  Mitral valve area/bsa, PHT, DP           1.63  cm^2/m^2 ----------  Mitral valve area/bsa, LVOT              0.83  cm^2/m^2 ----------  continuity  Mitral annulus VTI, D                    35.8  cm       ----------    Pulmonary arteries                       Value          Reference  PA pressure, S, DP                       23    mm Hg    <=30    Tricuspid valve                          Value          Reference  Tricuspid regurg peak velocity           222   cm/s     ----------  Tricuspid peak RV-RA gradient            20    mm Hg    ----------    Right atrium                             Value  Reference  RA ID, S-I, ES, A4C                      48.8  mm       34 - 49  RA area, ES, A4C                         14.1  cm^2     8.3 - 19.5  RA volume, ES, A/L                       32.9  ml       ----------  RA volume/bsa, ES, A/L                   17.5  ml/m^2   ----------    Systemic veins                           Value          Reference  Estimated CVP                            3     mm Hg    ----------    Right ventricle                          Value          Reference  TAPSE                                    20.6  mm       ----------  RV pressure, S, DP                       23    mm Hg    <=30  RV s&', lateral, S                        19.4  cm/s     ----------  Legend: (L)  and  (H)  mark values outside specified reference  range.  ------------------------------------------------------------------- Prepared and Electronically Authenticated by  Kirk Ruths 2019-12-30T15:00:37   Impression:  Patient is doing very well approximately 21-month status post minimally invasive mitral valve repair  Plan:  I have instructed the patient that she may stop taking warfarin.  I suggested that she might continue to take aspirin 81 mg daily at least for the next few months.  We have otherwise not recommended any changes to her current medications.  I have advised the patient that she may continue to increase her activity without any particular limitations.  The patient has been reminded regarding the importance of dental hygiene and the lifelong need for antibiotic prophylaxis for all dental cleanings and other related invasive procedures.  Patient will return to our office for routine follow-up next fall, approximately 1 year following her surgery.  She will call and return sooner should specific problems or questions arise.  I spent in excess of 10 minutes during the conduct of this office consultation and >50% of this time involved direct face-to-face encounter with the patient for counseling and/or coordination of their care.    Valentina Gu. Roxy Manns, MD 01/14/2019 4:41 PM

## 2019-01-14 NOTE — Progress Notes (Signed)
Kara Owens 48 y.o. female Nutrition Note Spoke with pt. Nutrition Plan and Nutrition Survey goals reviewed with pt. Pt is following a Heart Healthy diet. Pt wants to lose wt. Pt has not been trying to lose wt. Heart healthy and weight loss tips reviewed (label reading, how to build a healthy plate, portion sizes, eating frequently across the day). Pt shared that her fatigue has lessened since starting at cardiac rehab. Discussed quick and easy heart healthy meals pt could assemble at home, pt has tried over night oats, and is bringing lunch 3x a week to work. Additionally discussed the importance of planning ahead and using batch cooking and meal prep to help during the week, distributed recipes to pt that would help with meal prepping and planning. Per discussion, pt does use canned/convenience foods often. Pt does not add salt to food. Pt does not eat out frequently. Pt expressed understanding of the information reviewed. Pt aware of nutrition education classes offered and would like to attend nutrition classes.  Lab Results  Component Value Date   HGBA1C 5.5 09/17/2018    Wt Readings from Last 3 Encounters:  12/18/18 161 lb 6 oz (73.2 kg)  12/12/18 161 lb (73 kg)  10/15/18 158 lb (71.7 kg)    Nutrition Diagnosis ? Food-and nutrition-related knowledge deficit related to lack of exposure to information as related to diagnosis of: ? CVD   Nutrition Intervention ? Pt's individual nutrition plan reviewed with pt. ? Benefits of adopting Heart Healthy diet discussed when [employer] reviewed.   ? Pt given handouts for: ? meal prep recipes  Goal(s)  Pt to identify and limit food sources of saturated fat, trans fat, refined carbohydrates and sodium  Pt to read labels ? Pt able to name foods that affect blood glucose.  Plan:   Pt to attend nutrition classes ? Nutrition I ? Nutrition II ? Portion Distortion   Will provide client-centered nutrition education as part of interdisciplinary  care  Monitor and evaluate progress toward nutrition goal with team.    Laurina Bustle, MS, RD, LDN 01/14/2019 8:25 AM

## 2019-01-14 NOTE — Patient Instructions (Signed)
Stop taking Coumadin (warfarin)  Continue all other previous medications without any changes at this time  You may resume unrestricted physical activity without any particular limitations at this time.  Endocarditis is a potentially serious infection of heart valves or inside lining of the heart.  It occurs more commonly in patients with diseased heart valves (such as patient's with aortic or mitral valve disease) and in patients who have undergone heart valve repair or replacement.  Certain surgical and dental procedures may put you at risk, such as dental cleaning, other dental procedures, or any surgery involving the respiratory, urinary, gastrointestinal tract, gallbladder or prostate gland.   To minimize your chances for develooping endocarditis, maintain good oral health and seek prompt medical attention for any infections involving the mouth, teeth, gums, skin or urinary tract.    Always notify your doctor or dentist about your underlying heart valve condition before having any invasive procedures. You will need to take antibiotics before certain procedures, including all routine dental cleanings or other dental procedures.  Your cardiologist or dentist should prescribe these antibiotics for you to be taken ahead of time.

## 2019-01-16 ENCOUNTER — Encounter (HOSPITAL_COMMUNITY): Payer: 59

## 2019-01-16 ENCOUNTER — Encounter (HOSPITAL_COMMUNITY)
Admission: RE | Admit: 2019-01-16 | Discharge: 2019-01-16 | Disposition: A | Discharge status: Home / Self Care | Payer: 59 | Source: Ambulatory Visit | Attending: Cardiovascular Disease | Admitting: Cardiovascular Disease

## 2019-01-16 DIAGNOSIS — Z9889 Other specified postprocedural states: Secondary | ICD-10-CM | POA: Diagnosis not present

## 2019-01-16 NOTE — Progress Notes (Signed)
I have reviewed a Home Exercise Prescription with Kara Owens . Kara Owens is currently exercising at home. The patient was advised to continue to exercise 2-3 days a week for 30-45 minutes at [employer].  Doretha and I discussed how to progress their exercise prescription. The patient stated that they understand the exercise prescription. We reviewed exercise guidelines, target heart rate during exercise, RPE Scale, weather conditions, NTG use, endpoints for exercise, warmup and cool down. Patient is encouraged to come to me with any questions. I will continue to follow up with the patient to assist them with progression and safety.    Carma Lair MS, ACSM CEP 1:42 PM 01/16/2019

## 2019-01-18 ENCOUNTER — Encounter (HOSPITAL_COMMUNITY): Payer: 59

## 2019-01-18 ENCOUNTER — Encounter (HOSPITAL_COMMUNITY)
Admission: RE | Admit: 2019-01-18 | Discharge: 2019-01-18 | Disposition: A | Discharge status: Home / Self Care | Payer: 59 | Source: Ambulatory Visit | Attending: Cardiovascular Disease | Admitting: Cardiovascular Disease

## 2019-01-18 DIAGNOSIS — Z9889 Other specified postprocedural states: Secondary | ICD-10-CM | POA: Diagnosis not present

## 2019-01-21 ENCOUNTER — Encounter (HOSPITAL_COMMUNITY)
Admission: RE | Admit: 2019-01-21 | Discharge: 2019-01-21 | Disposition: A | Discharge status: Home / Self Care | Payer: 59 | Source: Ambulatory Visit | Attending: Cardiovascular Disease | Admitting: Cardiovascular Disease

## 2019-01-21 ENCOUNTER — Encounter (HOSPITAL_COMMUNITY): Payer: 59

## 2019-01-21 DIAGNOSIS — Z9889 Other specified postprocedural states: Secondary | ICD-10-CM | POA: Diagnosis not present

## 2019-01-23 ENCOUNTER — Encounter (HOSPITAL_COMMUNITY): Payer: 59

## 2019-01-23 ENCOUNTER — Encounter (HOSPITAL_COMMUNITY)
Admission: RE | Admit: 2019-01-23 | Discharge: 2019-01-23 | Disposition: A | Discharge status: Home / Self Care | Payer: 59 | Source: Ambulatory Visit | Attending: Cardiovascular Disease | Admitting: Cardiovascular Disease

## 2019-01-23 DIAGNOSIS — Z9889 Other specified postprocedural states: Secondary | ICD-10-CM

## 2019-01-24 ENCOUNTER — Encounter: Payer: Self-pay | Admitting: Cardiovascular Disease

## 2019-01-25 ENCOUNTER — Encounter (HOSPITAL_COMMUNITY)
Admission: RE | Admit: 2019-01-25 | Discharge: 2019-01-25 | Disposition: A | Discharge status: Home / Self Care | Payer: 59 | Source: Ambulatory Visit | Attending: Cardiovascular Disease | Admitting: Cardiovascular Disease

## 2019-01-25 ENCOUNTER — Encounter (HOSPITAL_COMMUNITY): Payer: 59

## 2019-01-25 DIAGNOSIS — Z9889 Other specified postprocedural states: Secondary | ICD-10-CM

## 2019-01-28 ENCOUNTER — Encounter (HOSPITAL_COMMUNITY): Payer: 59

## 2019-01-28 ENCOUNTER — Encounter (HOSPITAL_COMMUNITY)
Admission: RE | Admit: 2019-01-28 | Discharge: 2019-01-28 | Disposition: A | Discharge status: Home / Self Care | Payer: 59 | Source: Ambulatory Visit | Attending: Cardiovascular Disease | Admitting: Cardiovascular Disease

## 2019-01-28 DIAGNOSIS — Z9889 Other specified postprocedural states: Secondary | ICD-10-CM | POA: Insufficient documentation

## 2019-01-28 DIAGNOSIS — Z7982 Long term (current) use of aspirin: Secondary | ICD-10-CM | POA: Diagnosis not present

## 2019-01-28 DIAGNOSIS — Z7901 Long term (current) use of anticoagulants: Secondary | ICD-10-CM | POA: Diagnosis not present

## 2019-01-28 DIAGNOSIS — Z79899 Other long term (current) drug therapy: Secondary | ICD-10-CM | POA: Insufficient documentation

## 2019-01-30 ENCOUNTER — Encounter (HOSPITAL_COMMUNITY): Payer: 59

## 2019-01-30 ENCOUNTER — Encounter (HOSPITAL_COMMUNITY)
Admission: RE | Admit: 2019-01-30 | Discharge: 2019-01-30 | Disposition: A | Discharge status: Home / Self Care | Payer: 59 | Source: Ambulatory Visit | Attending: Cardiovascular Disease | Admitting: Cardiovascular Disease

## 2019-01-30 DIAGNOSIS — Z9889 Other specified postprocedural states: Secondary | ICD-10-CM

## 2019-01-31 NOTE — Progress Notes (Signed)
Cardiac Individual Treatment Plan  Patient Details  Name: Kara Owens MRN: [id number] Date of Birth: [birth year] Referring Provider:     CARDIAC REHAB PHASE II ORIENTATION from 12/18/2018 in [facility]  Referring Provider  [name], Arnette Norris, MD      Initial Encounter Date:    CARDIAC REHAB PHASE II ORIENTATION from 12/18/2018 in [facility]  Date  12/18/18      Visit Diagnosis: 09/19/18 MV Repair  Patient's Home Medications on Admission:  Current Outpatient Medications:  .  acetaminophen (TYLENOL) 500 MG tablet, Take 1,000 mg by mouth every 6 (six) hours as needed (for pain)., Disp: , Rfl:  .  amoxicillin (AMOXIL) 500 MG tablet, Take 2 grams 1 hour prior to dental work, Disp: 4 tablet, Rfl: 2 .  aspirin EC 81 MG EC tablet, Take 1 tablet (81 mg total) by mouth daily., Disp: , Rfl:  .  Multiple Vitamins-Minerals (AIRBORNE) CHEW, Chew 1 tablet by mouth 2 (two) times daily as needed (Olds)., Disp: , Rfl:  .  polyethylene glycol (MIRALAX / GLYCOLAX) packet, Take 17 g by mouth daily. , Disp: , Rfl:   Past Medical History: Past Medical History:  Diagnosis Date  . Arrhythmia    post op atrial fibrillation  . Chest pain    Left sided--sharp pain  . Complication of anesthesia   . Fatigue    Extreme  . Mitral regurgitation    Mild  . Mitral valve prolapse   . Palpitations   . PONV (postoperative nausea and vomiting)   . S/P minimally invasive mitral valve repair 09/19/2018   Artificial Gore-tex neochord placement x10 with 32 mm Sorin Memo 4D ring annuloplasty via right mini thoracotomy approach    Tobacco Use: Social History   Tobacco Use  Smoking Status Never Smoker  Smokeless Tobacco Never Used    Labs: Recent Review Flowsheet Data    Labs for ITP Cardiac and Pulmonary Rehab Latest Ref Rng & Units 09/19/2018 09/19/2018 09/19/2018 09/19/2018 09/19/2018   Cholestrol 0 - 200 mg/dL - - - - -   LDLCALC 0 -  99 mg/dL - - - - -   HDL >39.0 mg/dL - - - - -   Trlycerides 0 - 149 mg/dL - - - - -   Hemoglobin A1c 4.8 - 5.6 % - - - - -   PHART 7.350 - 7.450 7.351 - 7.230(L) 7.308(L) -   PCO2ART 32.0 - 48.0 mmHg 37.5 - 50.6(H) 45.6 -   HCO3 20.0 - 28.0 mmol/L 20.7 - 21.5 22.8 -   TCO2 22 - 32 mmol/L 22 21(L) 23 24 24    ACIDBASEDEF 0.0 - 2.0 mmol/L 4.0(H) - 6.0(H) 3.0(H) -   O2SAT % 100.0 - 99.0 98.0 -      Capillary Blood Glucose: Lab Results  Component Value Date   GLUCAP 107 (H) 09/21/2018   GLUCAP 103 (H) 09/20/2018   GLUCAP 125 (H) 09/20/2018   GLUCAP 110 (H) 09/20/2018   GLUCAP 98 09/20/2018     Exercise Target Goals: Exercise Program Goal: Individual exercise prescription set using results from initial 6 min walk test and THRR while considering  patient's activity barriers and safety.   Exercise Prescription Goal: Initial exercise prescription builds to 30-45 minutes a day of aerobic activity, 2-3 days per week.  Home exercise guidelines will be given to patient during program as part of exercise prescription that the participant will acknowledge.  Activity Barriers & Risk  Stratification: Activity Barriers & Cardiac Risk Stratification - 12/18/18 0848      Activity Barriers & Cardiac Risk Stratification   Activity Barriers  None    Cardiac Risk Stratification  Moderate       6 Minute Walk: 6 Minute Walk    Row Name 12/18/18 0810         6 Minute Walk   Phase  Initial     Distance  1283 feet     Walk Time  6 minutes     # of Rest Breaks  0     MPH  2.43     METS  4.04     RPE  7     Perceived Dyspnea   0     VO2 Peak  14.14     Symptoms  Yes (comment)     Comments  Patient c/o slight chest tightness during walk test, which resolved with rest.     Resting HR  90 bpm     Resting BP  104/62     Resting Oxygen Saturation   98 %     Exercise Oxygen Saturation  during 6 min walk  99 %     Max Ex. HR  100 bpm     Max Ex. BP  108/64     2 Minute Post BP  104/70         Oxygen Initial Assessment:   Oxygen Re-Evaluation:   Oxygen Discharge (Final Oxygen Re-Evaluation):   Initial Exercise Prescription: Initial Exercise Prescription - 12/18/18 0900      Date of Initial Exercise RX and Referring Provider   Date  12/18/18    Referring Provider  [name], MD    Expected Discharge Date  02/22/19      Treadmill   MPH  2.5    Grade  0    Minutes  10    METs  2.91      Bike   Level  1.2    Minutes  10    METs  4.13      NuStep   Level  3    SPM  85    Minutes  10    METs  2.9      Prescription Details   Frequency (times per week)  3    Duration  Progress to 30 minutes of continuous aerobic without signs/symptoms of physical distress      Intensity   THRR 40-80% of Max Heartrate  68-137    Ratings of Perceived Exertion  11-13    Perceived Dyspnea  0-4      Progression   Progression  Continue to progress workloads to maintain intensity without signs/symptoms of physical distress.      Resistance Training   Training Prescription  Yes    Weight  3lbs    Reps  10-15       Perform Capillary Blood Glucose checks as needed.  Exercise Prescription Changes: Exercise Prescription Changes    Row Name 12/24/18 0800 01/02/19 1000 01/11/19 1655 01/25/19 1000       Response to Exercise   Blood Pressure (Admit)  112/60  112/79  102/70  104/70    Blood Pressure (Exercise)  118/60  128/68  118/60  118/74    Blood Pressure (Exit)  110/62  108/80  104/62  120/80    Heart Rate (Admit)  99 bpm  85 bpm  100 bpm  96 bpm    Heart Rate (Exercise)  110 bpm  131 bpm  130 bpm  134 bpm    Heart Rate (Exit)  93 bpm  95 bpm  95 bpm  93 bpm    Rating of Perceived Exertion (Exercise)  14  13  13  13     Perceived Dyspnea (Exercise)  0  0  0  0    Symptoms  None  None  None  None    Comments  Pt oriented to exercise equipment   None  None  None    Duration  Progress to 30 minutes of  aerobic without signs/symptoms of physical distress  Continue  with 30 min of aerobic exercise without signs/symptoms of physical distress.  Continue with 30 min of aerobic exercise without signs/symptoms of physical distress.  Continue with 30 min of aerobic exercise without signs/symptoms of physical distress.    Intensity  THRR unchanged  THRR unchanged  THRR unchanged  THRR unchanged      Progression   Progression  Continue to progress workloads to maintain intensity without signs/symptoms of physical distress.  Continue to progress workloads to maintain intensity without signs/symptoms of physical distress.  Continue to progress workloads to maintain intensity without signs/symptoms of physical distress.  Continue to progress workloads to maintain intensity without signs/symptoms of physical distress.    Average METs  2.97  3.24  3.44  4.1      Resistance Training   Training Prescription  Yes  No  Yes  Yes    Weight  3lbs  -  3lbs  4lbs    Reps  10-15  -  10-15  10-15    Time  10 Minutes  -  10 Minutes  10 Minutes      Treadmill   MPH  2.5  2.5  2.5  3    Grade  0  0  0  10    Minutes  10  10  10  10     METs  2.91  2.91  2.91  3.71      Bike   Level  1.2  1.2  1.2  1.2    Minutes  10  10  10  10     METs  4.13  4.13  4.11  4.11      NuStep   Level  3  3  3  4     SPM  75  85  85  95    Minutes  10  10  10  10     METs  1.9  2.7  3.3  4.3      Home Exercise Plan   Plans to continue exercise at  -  -  [employer] [facility] [employer] (comment) [employer] (comment) [employer]    Frequency  -  -  Add 2 additional days to program exercise sessions.  Add 2 additional days to program exercise sessions.    Initial Home Exercises Provided  -  -  01/16/19  01/16/19       Exercise Comments: Exercise Comments    Row Name 12/24/18 1525 01/16/19 1343         Exercise Comments  Pt's first day of exercise. Pt oriented to exercise equipment. Pt responded well to exercise workloads. Will continue to monitor and progress pt  as tolerated.   Reviewed HEP with pt. Pt has recently joined [employer]. Will continue to monitor and progres pt as tolerated.  Exercise Goals and Review: Exercise Goals    Row Name 12/18/18 0849             Exercise Goals   Increase Physical Activity  Yes       Intervention  Provide advice, education, support and counseling about physical activity/exercise needs.;Develop an individualized exercise prescription for aerobic and resistive training based on initial evaluation findings, risk stratification, comorbidities and participant's personal goals.       Expected Outcomes  Short Term: Attend rehab on a regular basis to increase amount of physical activity.;Long Term: Add in home exercise to make exercise part of routine and to increase amount of physical activity.;Long Term: Exercising regularly at least 3-5 days a week.       Increase Strength and Stamina  Yes       Intervention  Provide advice, education, support and counseling about physical activity/exercise needs.;Develop an individualized exercise prescription for aerobic and resistive training based on initial evaluation findings, risk stratification, comorbidities and participant's personal goals.       Expected Outcomes  Short Term: Increase workloads from initial exercise prescription for resistance, speed, and METs.;Short Term: Perform resistance training exercises routinely during rehab and add in resistance training at home;Long Term: Improve cardiorespiratory fitness, muscular endurance and strength as measured by increased METs and functional capacity (6MWT)       Able to understand and use rate of perceived exertion (RPE) scale  Yes       Intervention  Provide education and explanation on how to use RPE scale       Expected Outcomes  Short Term: Able to use RPE daily in rehab to express subjective intensity level;Long Term:  Able to use RPE to guide intensity level when exercising independently        Knowledge and understanding of Target Heart Rate Range (THRR)  Yes       Intervention  Provide education and explanation of THRR including how the numbers were predicted and where they are located for reference       Expected Outcomes  Short Term: Able to state/look up THRR;Long Term: Able to use THRR to govern intensity when exercising independently;Short Term: Able to use daily as guideline for intensity in rehab       Able to check pulse independently  Yes       Intervention  Provide education and demonstration on how to check pulse in carotid and radial arteries.;Review the importance of being able to check your own pulse for safety during independent exercise       Expected Outcomes  Short Term: Able to explain why pulse checking is important during independent exercise;Long Term: Able to check pulse independently and accurately       Understanding of Exercise Prescription  Yes       Intervention  Provide education, explanation, and written materials on patient's individual exercise prescription       Expected Outcomes  Short Term: Able to explain program exercise prescription;Long Term: Able to explain home exercise prescription to exercise independently          Exercise Goals Re-Evaluation : Exercise Goals Re-Evaluation    Row Name 01/16/19 1344             Exercise Goal Re-Evaluation   Exercise Goals Review  Increase Physical Activity;Able to understand and use rate of perceived exertion (RPE) scale;Understanding of Exercise Prescription;Knowledge and understanding of Target Heart Rate Range (THRR);Increase Strength and Stamina;Able to check pulse independently  Comments  Reviewed HEP with pt. Also reviewed THRR, RPE Scale, weather conditions, endpoints of exericse, NTG use, when to call 911, warmup and cool down.        Expected Outcomes  Pt will continue to exercise 2-3 days at [employer] for 30-45 minutes with husband. Pt will continue to increase cardiovascular  strength. Will continue to monitor.           Discharge Exercise Prescription (Final Exercise Prescription Changes): Exercise Prescription Changes - 01/25/19 1000      Response to Exercise   Blood Pressure (Admit)  104/70    Blood Pressure (Exercise)  118/74    Blood Pressure (Exit)  120/80    Heart Rate (Admit)  96 bpm    Heart Rate (Exercise)  134 bpm    Heart Rate (Exit)  93 bpm    Rating of Perceived Exertion (Exercise)  13    Perceived Dyspnea (Exercise)  0    Symptoms  None    Comments  None    Duration  Continue with 30 min of aerobic exercise without signs/symptoms of physical distress.    Intensity  THRR unchanged      Progression   Progression  Continue to progress workloads to maintain intensity without signs/symptoms of physical distress.    Average METs  4.1      Resistance Training   Training Prescription  Yes    Weight  4lbs    Reps  10-15    Time  10 Minutes      Treadmill   MPH  3    Grade  10    Minutes  10    METs  3.71      Bike   Level  1.2    Minutes  10    METs  4.11      NuStep   Level  4    SPM  95    Minutes  10    METs  4.3      Home Exercise Plan   Plans to continue exercise at  [employer] (comment)   [employer]   Frequency  Add 2 additional days to program exercise sessions.    Initial Home Exercises Provided  01/16/19       Nutrition:  Target Goals: Understanding of nutrition guidelines, daily intake of sodium 1500mg , cholesterol 200mg , calories 30% from fat and 7% or less from saturated fats, daily to have 5 or more servings of fruits and vegetables.  Biometrics: Pre Biometrics - 12/18/18 0745      Pre Biometrics   Height  5\' 8"  (1.727 m)    Weight  73.2 kg    Waist Circumference  33 inches    Hip Circumference  42.5 inches    Waist to Hip Ratio  0.78 %    BMI (Calculated)  24.54    Triceps Skinfold  35 mm    % Body Fat  35.9 %    Grip Strength  30 kg    Flexibility  16.5 in    Single Leg Stand   30 seconds        Nutrition Therapy Plan and Nutrition Goals: Nutrition Therapy & Goals - 12/18/18 1248      Nutrition Therapy   Diet  heart healthy      Personal Nutrition Goals   Nutrition Goal  Pt to identify and limit food sources of saturated fat, trans fat, refined carbohydrates and sodium    Personal Goal #  2  Pt to read labels    Personal Goal #3  Pt able to name foods that affect blood glucose      Intervention Plan   Intervention  Prescribe, educate and counsel regarding individualized specific dietary modifications aiming towards targeted core components such as weight, hypertension, lipid management, diabetes, heart failure and other comorbidities.    Expected Outcomes  Short Term Goal: Understand basic principles of dietary content, such as calories, fat, sodium, cholesterol and nutrients.;Long Term Goal: Adherence to prescribed nutrition plan.       Nutrition Assessments: Nutrition Assessments - 12/20/18 1058      MEDFICTS Scores   Pre Score  32       Nutrition Goals Re-Evaluation:   Nutrition Goals Re-Evaluation:   Nutrition Goals Discharge (Final Nutrition Goals Re-Evaluation):   Psychosocial: Target Goals: Acknowledge presence or absence of significant depression and/or stress, maximize coping skills, provide positive support system. Participant is able to verbalize types and ability to use techniques and skills needed for reducing stress and depression.  Initial Review & Psychosocial Screening: Initial Psych Review & Screening - 12/18/18 1033      Initial Review   Current issues with  None Identified      Family Dynamics   Good Support System?  Yes   Hadley has her husband for support     Barriers   Psychosocial barriers to participate in program  There are no identifiable barriers or psychosocial needs.      Screening Interventions   Interventions  Encouraged to exercise       Quality of Life Scores: Quality of Life - 12/18/18 0835       Quality of Life   Select  Quality of Life      Quality of Life Scores   Health/Function Pre  26.4 %    Socioeconomic Pre  26.43 %    Psych/Spiritual Pre  29.14 %    Family Pre  28.8 %    GLOBAL Pre  27.32 %      Scores of 19 and below usually indicate a poorer quality of life in these areas.  A difference of  2-3 points is a clinically meaningful difference.  A difference of 2-3 points in the total score of the Quality of Life Index has been associated with significant improvement in overall quality of life, self-image, physical symptoms, and general health in studies assessing change in quality of life.  PHQ-9: Recent Review Flowsheet Data    Depression screen [facility] 2/9 01/14/2019 12/24/2018   Decreased Interest 0 0   Down, Depressed, Hopeless 0 0   PHQ - 2 Score 0 0     Interpretation of Total Score  Total Score Depression Severity:  1-4 = Minimal depression, 5-9 = Mild depression, 10-14 = Moderate depression, 15-19 = Moderately severe depression, 20-27 = Severe depression   Psychosocial Evaluation and Intervention: Psychosocial Evaluation - 12/24/18 0812      Psychosocial Evaluation & Interventions   Interventions  Encouraged to exercise with the program and follow exercise prescription;Stress management education;Relaxation education    Comments  No psychosocial interventions necessary.    Expected Outcomes  Indra will maintain a positive outlook with good coping skills.     Continue Psychosocial Services   No Follow up required       Psychosocial Re-Evaluation: Psychosocial Re-Evaluation    [place] Name 12/28/18 0857 01/30/19 1707           Psychosocial Re-Evaluation   Current issues with  None Identified  None Identified      Comments  No identifiable pyschosocial needs.  No identifiable pyschosocial needs.      Expected Outcomes  Rynlee will maintain a positive outlook and utilize good coping skills.   Damesha will maintain a positive outlook and utilize good coping skills.        Interventions  Encouraged to attend Cardiac Rehabilitation for the exercise  Encouraged to attend Cardiac Rehabilitation for the exercise      Continue Psychosocial Services   No Follow up required  No Follow up required         Psychosocial Discharge (Final Psychosocial Re-Evaluation): Psychosocial Re-Evaluation - 01/30/19 1707      Psychosocial Re-Evaluation   Current issues with  None Identified    Comments  No identifiable pyschosocial needs.    Expected Outcomes  Kathline will maintain a positive outlook and utilize good coping skills.     Interventions  Encouraged to attend Cardiac Rehabilitation for the exercise    Continue Psychosocial Services   No Follow up required       Vocational Rehabilitation: Provide vocational rehab assistance to qualifying candidates.   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 Participant will state understanding/return demonstration of topics presented.  Learning Barriers/Preferences: Learning Barriers/Preferences - 12/18/18 1034      Learning Barriers/Preferences   Learning Barriers  Sight   wears contacts   Learning Preferences  Pictoral;Skilled Demonstration;Video       Education Topics: Count Your Pulse:  -Group instruction provided by verbal instruction, demonstration, patient participation and written materials to support subject.  Instructors address importance of being able to find your pulse and how to count your pulse when at home without a heart monitor.  Patients get hands on experience counting their pulse with staff help and individually.   Heart Attack, Angina, and Risk  Factor Modification:  -Group instruction provided by verbal instruction, video, and written materials to support subject.  Instructors address signs and symptoms of angina and heart attacks.    Also discuss risk factors for heart disease and how to make changes to improve heart health risk factors.   Functional Fitness:  -Group instruction provided by verbal instruction, demonstration, patient participation, and written materials to support subject.  Instructors address safety measures for doing things around the house.  Discuss how to get up and down off the floor, how to pick things up properly, how to safely get out of a chair without assistance, and balance training.   Meditation and Mindfulness:  -Group instruction provided by verbal instruction, patient participation, and written materials to support subject.  Instructor addresses importance of mindfulness and meditation practice to help reduce stress and improve awareness.  Instructor also leads participants through a meditation exercise.    Stretching for Flexibility and Mobility:  -Group instruction provided by verbal instruction, patient participation, and written materials to support subject.  Instructors lead participants through series of stretches that are designed to increase flexibility thus improving mobility.  These stretches are additional exercise for major muscle groups that are typically performed during regular warm up and cool down.   Hands Only  CPR:  -Group verbal, video, and participation provides a basic overview of AHA guidelines for community CPR. Role-play of emergencies allow participants the opportunity to practice calling for help and chest compression technique with discussion of AED use.   Hypertension: -Group verbal and written instruction that provides a basic overview of hypertension including the most recent diagnostic guidelines, risk factor reduction with self-care instructions and medication  management.    Nutrition I class: Heart Healthy Eating:  -Group instruction provided by PowerPoint slides, verbal discussion, and written materials to support subject matter. The instructor gives an explanation and review of the Therapeutic Lifestyle Changes diet recommendations, which includes a discussion on lipid goals, dietary fat, sodium, fiber, plant stanol/sterol esters, sugar, and the components of a well-balanced, healthy diet.   Nutrition II class: Lifestyle Skills:  -Group instruction provided by PowerPoint slides, verbal discussion, and written materials to support subject matter. The instructor gives an explanation and review of label reading, grocery shopping for heart health, heart healthy recipe modifications, and ways to make healthier choices when eating out.   Diabetes Question & Answer:  -Group instruction provided by PowerPoint slides, verbal discussion, and written materials to support subject matter. The instructor gives an explanation and review of diabetes co-morbidities, pre- and post-prandial blood glucose goals, pre-exercise blood glucose goals, signs, symptoms, and treatment of hypoglycemia and hyperglycemia, and foot care basics.   Diabetes Blitz:  -Group instruction provided by PowerPoint slides, verbal discussion, and written materials to support subject matter. The instructor gives an explanation and review of the physiology behind type 1 and type 2 diabetes, diabetes medications and rational behind using different medications, pre- and post-prandial blood glucose recommendations and Hemoglobin A1c goals, diabetes diet, and exercise including blood glucose guidelines for exercising safely.    Portion Distortion:  -Group instruction provided by PowerPoint slides, verbal discussion, written materials, and food models to support subject matter. The instructor gives an explanation of serving size versus portion size, changes in portions sizes over the last 20 years,  and what consists of a serving from each food group.   Stress Management:  -Group instruction provided by verbal instruction, video, and written materials to support subject matter.  Instructors review role of stress in heart disease and how to cope with stress positively.     Exercising on Your Own:  -Group instruction provided by verbal instruction, power point, and written materials to support subject.  Instructors discuss benefits of exercise, components of exercise, frequency and intensity of exercise, and end points for exercise.  Also discuss use of nitroglycerin and activating EMS.  Review options of places to exercise outside of rehab.  Review guidelines for sex with heart disease.   Cardiac Drugs I:  -Group instruction provided by verbal instruction and written materials to support subject.  Instructor reviews cardiac drug classes: antiplatelets, anticoagulants, beta blockers, and statins.  Instructor discusses reasons, side effects, and lifestyle considerations for each drug class.   Cardiac Drugs II:  -Group instruction provided by verbal instruction and written materials to support subject.  Instructor reviews cardiac drug classes: angiotensin converting enzyme inhibitors (ACE-I), angiotensin II receptor blockers (ARBs), nitrates, and calcium channel blockers.  Instructor discusses reasons, side effects, and lifestyle considerations for each drug class.   Anatomy and Physiology of the Circulatory System:  Group verbal and written instruction and models provide basic cardiac anatomy and physiology, with the coronary electrical and arterial systems. Review of: AMI, Angina, Valve disease, Heart Failure, Peripheral Artery Disease, Cardiac Arrhythmia, Pacemakers, and  the ICD.   CARDIAC REHAB PHASE II EXERCISE from 01/30/2019 in [place]  Date  01/30/19  Educator  RN  Instruction Review Code  2- Demonstrated Understanding      Other Education:   -Group or individual verbal, written, or video instructions that support the educational goals of the cardiac rehab program.   Holiday Eating Survival Tips:  -Group instruction provided by PowerPoint slides, verbal discussion, and written materials to support subject matter. The instructor gives patients tips, tricks, and techniques to help them not only survive but enjoy the holidays despite the onslaught of food that accompanies the holidays.   Knowledge Questionnaire Score: Knowledge Questionnaire Score - 12/18/18 0837      Knowledge Questionnaire Score   Pre Score  24/24       Core Components/Risk Factors/Patient Goals at Admission: Personal Goals and Risk Factors at Admission - 12/18/18 0852      Core Components/Risk Factors/Patient Goals on Admission   Improve shortness of breath with ADL's  Yes    Intervention  Provide education, individualized exercise plan and daily activity instruction to help decrease symptoms of SOB with activities of daily living.    Expected Outcomes  Short Term: Improve cardiorespiratory fitness to achieve a reduction of symptoms when performing ADLs;Long Term: Be able to perform more ADLs without symptoms or delay the onset of symptoms    Stress  Yes    Intervention  Offer individual and/or small group education and counseling on adjustment to heart disease, stress management and health-related lifestyle change. Teach and support self-help strategies.;Refer participants experiencing significant psychosocial distress to appropriate mental health specialists for further evaluation and treatment. When possible, include family members and significant others in education/counseling sessions.    Expected Outcomes  Short Term: Participant demonstrates changes in health-related behavior, relaxation and other stress management skills, ability to obtain effective social support, and compliance with psychotropic medications if prescribed.;Long Term: Emotional wellbeing  is indicated by absence of clinically significant psychosocial distress or social isolation.       Core Components/Risk Factors/Patient Goals Review:  Goals and Risk Factor Review    Row Name 12/24/18 0823 12/28/18 0858 01/30/19 1707         Core Components/Risk Factors/Patient Goals Review   Personal Goals Review  Stress;Improve shortness of breath with ADL's  Stress;Improve shortness of breath with ADL's  Stress;Improve shortness of breath with ADL's     Review  Pt with few CAD RFs willing to participate in CR exercise.  Pt would like to increase stamina and decrease her SOB and weight.     Pt with few CAD RFs willing to participate in CR exercise.  Naveyah has started exercise recently and is tolerating it well. VSS.  Pt with few CAD RFs willing to participate in CR exercise. Temesha is tolerating exercise with inccreases in her workloads.  She feels that she is increasing her stamina and decreasing her shortness of breath.      Expected Outcomes  Pt will continue to participate in CR exercise, nutrition, and lifestyle modification opportunities.   Pt will continue to participate in CR exercise, nutrition, and lifestyle modification opportunities.   Pt will continue to participate in CR exercise, nutrition, and lifestyle modification opportunities.         Core Components/Risk Factors/Patient Goals at Discharge (Final Review):  Goals and Risk Factor Review - 01/30/19 1707      Core Components/Risk Factors/Patient Goals Review   Personal Goals Review  Stress;Improve shortness of breath with ADL's    Review  Pt with few CAD RFs willing to participate in CR exercise. Haniah is tolerating exercise with inccreases in her workloads.  She feels that she is increasing her stamina and decreasing her shortness of breath.     Expected Outcomes  Pt will continue to participate in CR exercise, nutrition, and lifestyle modification opportunities.        ITP Comments: ITP Comments    Row Name 12/18/18  0741 12/24/18 0801 01/30/19 1706       ITP Comments  Medical Director- Dr. Fransico Him, MD  30 Day ITP Review. Pt started exercise today and tolerated it well.   30 Day ITP Review. Shephanie is tolerating exercise with inccreases in her workloads.  She feels that she is increasing her stamina and decreasing her shortness of breath.         Comments: See ITP Comments.

## 2019-02-01 ENCOUNTER — Encounter (HOSPITAL_COMMUNITY): Payer: 59

## 2019-02-01 ENCOUNTER — Encounter (HOSPITAL_COMMUNITY)
Admission: RE | Admit: 2019-02-01 | Discharge: 2019-02-01 | Disposition: A | Discharge status: Home / Self Care | Payer: 59 | Source: Ambulatory Visit | Attending: Cardiovascular Disease | Admitting: Cardiovascular Disease

## 2019-02-01 DIAGNOSIS — Z9889 Other specified postprocedural states: Secondary | ICD-10-CM | POA: Diagnosis not present

## 2019-02-04 ENCOUNTER — Encounter (HOSPITAL_COMMUNITY): Payer: 59

## 2019-02-04 ENCOUNTER — Encounter (HOSPITAL_COMMUNITY)
Admission: RE | Admit: 2019-02-04 | Discharge: 2019-02-04 | Disposition: A | Discharge status: Home / Self Care | Payer: 59 | Source: Ambulatory Visit | Attending: Cardiovascular Disease | Admitting: Cardiovascular Disease

## 2019-02-04 DIAGNOSIS — Z9889 Other specified postprocedural states: Secondary | ICD-10-CM | POA: Diagnosis not present

## 2019-02-05 DIAGNOSIS — Z01419 Encounter for gynecological examination (general) (routine) without abnormal findings: Secondary | ICD-10-CM | POA: Diagnosis not present

## 2019-02-05 DIAGNOSIS — R5383 Other fatigue: Secondary | ICD-10-CM | POA: Diagnosis not present

## 2019-02-05 DIAGNOSIS — Z1231 Encounter for screening mammogram for malignant neoplasm of breast: Secondary | ICD-10-CM | POA: Diagnosis not present

## 2019-02-06 ENCOUNTER — Encounter (HOSPITAL_COMMUNITY)
Admission: RE | Admit: 2019-02-06 | Discharge: 2019-02-06 | Disposition: A | Discharge status: Home / Self Care | Payer: 59 | Source: Ambulatory Visit | Attending: Cardiovascular Disease | Admitting: Cardiovascular Disease

## 2019-02-06 ENCOUNTER — Encounter (HOSPITAL_COMMUNITY): Payer: 59

## 2019-02-06 DIAGNOSIS — Z9889 Other specified postprocedural states: Secondary | ICD-10-CM

## 2019-02-08 ENCOUNTER — Encounter (HOSPITAL_COMMUNITY): Payer: 59

## 2019-02-08 ENCOUNTER — Encounter (HOSPITAL_COMMUNITY)
Admission: RE | Admit: 2019-02-08 | Discharge: 2019-02-08 | Disposition: A | Discharge status: Home / Self Care | Payer: 59 | Source: Ambulatory Visit | Attending: Cardiovascular Disease | Admitting: Cardiovascular Disease

## 2019-02-08 VITALS — Ht 68.0 in | Wt 159.2 lb

## 2019-02-08 DIAGNOSIS — Z9889 Other specified postprocedural states: Secondary | ICD-10-CM | POA: Diagnosis not present

## 2019-02-11 ENCOUNTER — Encounter (HOSPITAL_COMMUNITY): Payer: 59

## 2019-02-11 ENCOUNTER — Encounter (HOSPITAL_COMMUNITY)
Admission: RE | Admit: 2019-02-11 | Discharge: 2019-02-11 | Disposition: A | Discharge status: Home / Self Care | Payer: 59 | Source: Ambulatory Visit | Attending: Cardiovascular Disease | Admitting: Cardiovascular Disease

## 2019-02-11 DIAGNOSIS — Z9889 Other specified postprocedural states: Secondary | ICD-10-CM

## 2019-02-13 ENCOUNTER — Ambulatory Visit: Payer: 59 | Admitting: Cardiovascular Disease

## 2019-02-13 ENCOUNTER — Encounter (HOSPITAL_COMMUNITY): Payer: 59

## 2019-02-13 ENCOUNTER — Encounter (HOSPITAL_COMMUNITY)
Admission: RE | Admit: 2019-02-13 | Discharge: 2019-02-13 | Disposition: A | Discharge status: Home / Self Care | Payer: 59 | Source: Ambulatory Visit | Attending: Cardiovascular Disease | Admitting: Cardiovascular Disease

## 2019-02-13 ENCOUNTER — Encounter: Payer: Self-pay | Admitting: Cardiovascular Disease

## 2019-02-13 VITALS — BP 108/74 | HR 91 | Ht 68.0 in | Wt 160.6 lb

## 2019-02-13 DIAGNOSIS — Z9889 Other specified postprocedural states: Secondary | ICD-10-CM | POA: Insufficient documentation

## 2019-02-13 DIAGNOSIS — I341 Nonrheumatic mitral (valve) prolapse: Secondary | ICD-10-CM

## 2019-02-13 MED ORDER — METOPROLOL SUCCINATE ER 25 MG PO TB24: 25.0000 mg | ORAL_TABLET | Freq: Every day | ORAL | 11 refills | Status: DC

## 2019-02-13 NOTE — Patient Instructions (Addendum)
Medication Instructions:  Your physician has recommended you make the following change in your medication:  START Metoprolol succinate (Toprol XL) 25 mg once daily  If you need a refill on your cardiac medications before your next appointment, please call your pharmacy.   Increase your intake of fluids (water with electrolyte tabs like Nun tablets, or gatorade) , protein ( hard boiled eggs, chicken, fish) , and a electrolytes ( V-8 juice, salt, potassium chloride  which is sold as No-Salt   Lab work: None Ordered    Testing/Procedures: None Ordered   Follow-Up: Your physician recommends that you return for a follow-up appointment on Thursday June 11 at 8:40 am with Dr. [name]

## 2019-02-13 NOTE — Progress Notes (Signed)
Cardiology Office Note:    Date:  02/13/2019   ID:  Kara Owens, DOB 1971/06/21, MRN [id number]  PCP:  [name], MD  Cardiologist:  [name], MD    Referring MD: [name], MD   Chief Complaint  Patient presents with  . Mitral Valve Prolapse        Kara Owens is a 48 y.o. female with a hx of mitral valve prolapse.  I last saw Kara Owens in 2016.  He has a history of mitral valve prolapse with mild mitral regurgitation.  Mother Kara Owens ) had an ablation last year. Brother had an MI at age 7. ( non smoker, HTN, obese)  Had labs at [name] [facility] office last week.  Had some pleuretic CP last week,    Was given prednisone which helped No cough, cold, no hemoptysis,  No fever, Pain was worse at nihgt.   Trying to exercise  Works in [employer].     Labs from Primary MD   Total chol = 187 Trigs = 70 HDL = 82 LDL = 91   February 23, 2018 Seen with husband  BP was elevated at the TEE  Brought her BP log , all readings were great  Is not exercising regularly   June 08, 2018:  Son is seen back today for follow-up of her mitral valve prolapse.  She has worn an event monitor and was found to have episodes of nonsustained ventricular tachycardia.  We started her on metoprolol 25 mg twice a day which seemed to help with the episodes of nonsustained VT.  We tried increasing the dose to 25 mg 3 times a day but she does  not tolerate this vey well at all due to profound fatigue   She had a stress Myoview study on May 8 which revealed an anterior defect that was thought to be due to breast artifact.    Coronary CT angiogram revealed normal coronary arteries.  She had a coronary calcium score of 0.  Had an episode of chest tightness.   Occurs after the palpitations. Last for a few minutes. Kara Owens   Resolves with deep breathing .  July 10, 2018:  Seen with husband , Kara Owens She has a little bit more energy now that she has reduce the metoprolol from 25 mg 3  times daily to 25 mg twice daily.  She still has palpitations He thinks the palpitations may have worsened slightly since reducing the dose of metoprolol.  Oct. 21, 2019  Morrison is back - had MV repiar on sept. 25,  Had PAF - is on amio  December 12, 2018:  Kara Owens is seen today for follow up for MV repair   - with Kara Owens Husband.  Still very sore Walks on weekend   Legs started swelling last week .   Has Several neurologic issues that are not clear.  She is having some dizziness.  She also has had some loss of memory at times.  She also has some hand shaking.  We discussed the fact that she would need to go to see a neurologist if these continued.  Feb. 19, 2020  Overall doing well .  HR has been elevated.  Staying hydrated.    Past Medical History:  Diagnosis Date  . Arrhythmia    post op atrial fibrillation  . Chest pain    Left sided--sharp pain  . Complication of anesthesia   . Fatigue    Extreme  . Mitral regurgitation  Mild  . Mitral valve prolapse   . Palpitations   . PONV (postoperative nausea and vomiting)   . S/P minimally invasive mitral valve repair 09/19/2018   Artificial Gore-tex neochord placement x10 with 32 mm Sorin Memo 4D ring annuloplasty via right mini thoracotomy approach    Past Surgical History:  Procedure Laterality Date  . EYE SURGERY Left    retinal repair   . MITRAL VALVE REPAIR Right 09/19/2018   Procedure: MINIMALLY INVASIVE MITRAL VALVE REPAIR (MVR). 71mm MEMO 4D RING.;  Surgeon: Kara Alberts, MD;  Location: [facility];  Service: Open Heart Surgery;  Laterality: Right;  GLUTARALDEHYDE  . TEE WITHOUT CARDIOVERSION N/A 12/08/2017   Procedure: TRANSESOPHAGEAL ECHOCARDIOGRAM (TEE);  Surgeon: Kara Fredrickson [name], MD;  Location: [facility];  Service: Cardiovascular;  Laterality: N/A;  . TEE WITHOUT CARDIOVERSION N/A 07/27/2018   Procedure: TRANSESOPHAGEAL ECHOCARDIOGRAM (TEE);  Surgeon: Kara Fredrickson [name], MD;  Location: [facility] ENDOSCOPY;  Service:  Cardiovascular;  Laterality: N/A;  . TEE WITHOUT CARDIOVERSION N/A 09/19/2018   Procedure: TRANSESOPHAGEAL ECHOCARDIOGRAM (TEE);  Surgeon: Kara Alberts, MD;  Location: [facility];  Service: Open Heart Surgery;  Laterality: N/A;  . TONSILLECTOMY    . TONSILLECTOMY      Current Medications: Current Meds  Medication Sig  . acetaminophen (TYLENOL) 500 MG tablet Take 1,000 mg by mouth every 6 (six) hours as needed (for pain).  Kara Owens amoxicillin (AMOXIL) 500 MG tablet Take 2 grams 1 hour prior to dental work  . aspirin EC 81 MG EC tablet Take 1 tablet (81 mg total) by mouth daily.  . Multiple Vitamins-Minerals (AIRBORNE) CHEW Chew 1 tablet by mouth 2 (two) times daily as needed (FOR IMMUNE HEALTH).  Kara Owens polyethylene glycol (MIRALAX / GLYCOLAX) packet Take 17 g by mouth daily.      Allergies:   Patient has no known allergies.   Social History   Socioeconomic History  . Marital status: Married    Spouse name: Not on file  . Number of children: Not on file  . Years of education: Not on file  . Highest education level: High school graduate  Occupational History  . Not on file  Social Needs  . Financial resource strain: Not hard at all  . Food insecurity:    Worry: Never true    Inability: Never true  . Transportation needs:    Medical: No    Non-medical: No  Tobacco Use  . Smoking status: Never Smoker  . Smokeless tobacco: Never Used  Substance and Sexual Activity  . Alcohol use: Never    Frequency: Never  . Drug use: Never  . Sexual activity: Not on file  Lifestyle  . Physical activity:    Days per week: 0 days    Minutes per session: 0 min  . Stress: Rather much  Relationships  . Social connections:    Talks on phone: Not on file    Gets together: Not on file    Attends religious service: Not on file    Active member of club or organization: Not on file    Attends meetings of clubs or organizations: Not on file    Relationship status: Not on file  Other Topics Concern  .  Not on file  Social History Narrative  . Not on file     Family History: The patient's family history includes Heart attack in her brother; Heart failure in her mother; Hypertension in her father and mother; Skin cancer in her father. ROS:  Please see the history of present illness.     All other systems reviewed and are negative.  EKGs/Labs/Other Studies Reviewed:    The following studies were reviewed today:   EKG:     Recent Labs: 04/30/2018: TSH 1.290 09/17/2018: ALT 17 09/20/2018: Magnesium 2.4 09/22/2018: BUN 6; Creatinine, Ser 0.52; Hemoglobin 8.8; Platelets 109; Potassium 3.6; Sodium 137  Recent Lipid Panel    Component Value Date/Time   CHOL 175 09/12/2008 0913   TRIG 40 09/12/2008 0913   HDL 57.8 09/12/2008 0913   CHOLHDL 3.0 CALC 09/12/2008 0913   VLDL 8 09/12/2008 0913   LDLCALC 109 (H) 09/12/2008 0913    Physical Exam: Blood pressure 108/74, pulse 91, height 5\' 8"  (1.727 m), weight 160 lb 9.6 oz (72.8 kg), SpO2 100 %.  GEN:  Well nourished, well developed in no acute distress HEENT: Normal NECK: No JVD; No carotid bruits LYMPHATICS: No lymphadenopathy CARDIAC: RRR , soft systolic murmur  Right thoroax incision is healing,  Still numb  RESPIRATORY:  Clear to auscultation without rales, wheezing or rhonchi  ABDOMEN: Soft, non-tender, non-distended MUSCULOSKELETAL:  No edema; No deformity  SKIN: Warm and dry NEUROLOGIC:  Alert and oriented x 3  ECG :      ASSESSMENT:    No diagnosis found. PLAN:    1.  Status post mitral valve repair:   She is healing well   she remains slightly tachycardic.   Will have her increase her intake of protein and salt.   I've recommended drinking V - 8 on occasion .  Will start toprol XL 25 mg a day .   2.  Sinus tachycardia:   Starting toprol XL 25 mg a day  i've asked her to increase her intake of protein and salt slighly .   3. PAF:  She has an apple watch. She has palpitations but these are all PVCs or perhaps  PACS with abberancy.   No further signs of atrial fib   Will see her in 3 months    Medication Adjustments/Labs and Tests Ordered: Current medicines are reviewed at length with the patient today.  Concerns regarding medicines are outlined above.  No orders of the defined types were placed in this encounter.  Meds ordered this encounter  Medications  . metoprolol succinate (TOPROL XL) 25 MG 24 hr tablet    Sig: Take 1 tablet (25 mg total) by mouth daily.    Dispense:  30 tablet    Refill:  11      Signed, [name], MD  02/13/2019 10:41 AM    [place]

## 2019-02-15 ENCOUNTER — Encounter (HOSPITAL_COMMUNITY): Payer: 59

## 2019-02-18 ENCOUNTER — Encounter (HOSPITAL_COMMUNITY)
Admission: RE | Admit: 2019-02-18 | Discharge: 2019-02-18 | Disposition: A | Discharge status: Home / Self Care | Payer: 59 | Source: Ambulatory Visit | Attending: Cardiovascular Disease | Admitting: Cardiovascular Disease

## 2019-02-18 ENCOUNTER — Encounter (HOSPITAL_COMMUNITY): Payer: 59

## 2019-02-18 DIAGNOSIS — Z9889 Other specified postprocedural states: Secondary | ICD-10-CM | POA: Diagnosis not present

## 2019-02-20 ENCOUNTER — Encounter (HOSPITAL_COMMUNITY): Payer: 59

## 2019-02-22 ENCOUNTER — Encounter (HOSPITAL_COMMUNITY): Payer: 59

## 2019-02-25 ENCOUNTER — Encounter (HOSPITAL_COMMUNITY): Payer: 59

## 2019-02-27 ENCOUNTER — Encounter (HOSPITAL_COMMUNITY): Payer: 59

## 2019-03-01 ENCOUNTER — Encounter (HOSPITAL_COMMUNITY): Payer: 59

## 2019-03-04 ENCOUNTER — Encounter (HOSPITAL_COMMUNITY): Payer: 59

## 2019-03-06 ENCOUNTER — Encounter (HOSPITAL_COMMUNITY): Payer: 59

## 2019-03-08 ENCOUNTER — Encounter (HOSPITAL_COMMUNITY): Payer: 59

## 2019-03-11 ENCOUNTER — Encounter (HOSPITAL_COMMUNITY): Payer: 59

## 2019-03-13 ENCOUNTER — Encounter (HOSPITAL_COMMUNITY): Payer: 59

## 2019-03-14 NOTE — Progress Notes (Signed)
Discharge Progress Report  Patient Details  Name: Kara Owens MRN: [id number] Date of Birth: 1971-09-16 Referring Provider:     [place] from 12/18/2018 in [place]  Referring Provider  [name], Arnette Norris, MD       Number of Visits: 24  Reason for Discharge:  Patient reached a stable level of exercise. Patient independent in their exercise. Patient has met program and personal goals.  Smoking History:  Social History   Tobacco Use  Smoking Status Never Smoker  Smokeless Tobacco Never Used    Diagnosis:  09/19/18 MV Repair  ADL UCSD:   Initial Exercise Prescription: Initial Exercise Prescription - 12/18/18 0900      Date of Initial Exercise RX and Referring Provider   Date  12/18/18    Referring Provider  [name], MD    Expected Discharge Date  02/22/19      Treadmill   MPH  2.5    Grade  0    Minutes  10    METs  2.91      Bike   Level  1.2    Minutes  10    METs  4.13      NuStep   Level  3    SPM  85    Minutes  10    METs  2.9      Prescription Details   Frequency (times per week)  3    Duration  Progress to 30 minutes of continuous aerobic without signs/symptoms of physical distress      Intensity   THRR 40-80% of Max Heartrate  68-137    Ratings of Perceived Exertion  11-13    Perceived Dyspnea  0-4      Progression   Progression  Continue to progress workloads to maintain intensity without signs/symptoms of physical distress.      Resistance Training   Training Prescription  Yes    Weight  3lbs    Reps  10-15       Discharge Exercise Prescription (Final Exercise Prescription Changes): Exercise Prescription Changes - 02/18/19 1510      Response to Exercise   Blood Pressure (Admit)  104/62    Blood Pressure (Exercise)  124/68    Blood Pressure (Exit)  104/60    Heart Rate (Admit)  86 bpm    Heart Rate (Exercise)  114 bpm    Heart Rate (Exit)  84 bpm    Rating of  Perceived Exertion (Exercise)  12    Perceived Dyspnea (Exercise)  0    Symptoms  None    Comments  Pt graduated from program     Duration  Continue with 30 min of aerobic exercise without signs/symptoms of physical distress.    Intensity  THRR unchanged      Progression   Progression  Continue to progress workloads to maintain intensity without signs/symptoms of physical distress.    Average METs  4.31      Resistance Training   Training Prescription  Yes    Weight  4lbs    Reps  10-15    Time  10 Minutes      Treadmill   MPH  3    Grade  1    Minutes  10    METs  3.71      Bike   Level  1.2    Minutes  10    METs  4.11      NuStep  Level  4    SPM  95    Minutes  10    METs  5.1      Home Exercise Plan   Plans to continue exercise at  [facility] (comment)   Local [employer]   Frequency  Add 3 additional days to program exercise sessions.    Initial Home Exercises Provided  01/16/19       Functional Capacity: 6 Minute Walk    Row Name 12/18/18 0810 02/08/19 1011       6 Minute Walk   Phase  Initial  -    Distance  1283 feet  1970 feet    Distance % Change  -  53.55 %    Distance Feet Change  -  687 ft    Walk Time  6 minutes  6 minutes    # of Rest Breaks  0  0    MPH  2.43  3.73    METS  4.04  5.42    RPE  7  13    Perceived Dyspnea   0  0    VO2 Peak  14.14  18.96    Symptoms  Yes (comment)  No    Comments  Patient c/o slight chest tightness during walk test, which resolved with rest.  -    Resting HR  90 bpm  101 bpm    Resting BP  104/62  114/70    Resting Oxygen Saturation   98 %  -    Exercise Oxygen Saturation  during 6 min walk  99 %  -    Max Ex. HR  100 bpm  108 bpm    Max Ex. BP  108/64  120/64    2 Minute Post BP  104/70  120/60       Psychological, QOL, Others - Outcomes: PHQ 2/9: Depression screen [facility] 2/9 02/18/2019 01/14/2019 12/24/2018  Decreased Interest 0 0 0  Down, Depressed, Hopeless 0 0 0  PHQ - 2 Score 0 0 0     Quality of Life: Quality of Life - 02/06/19 1016      Quality of Life   Select  Quality of Life      Quality of Life Scores   Health/Function Pre  26.4 %    Health/Function Post  29.2 %    Health/Function % Change  10.61 %    Socioeconomic Pre  26.43 %    Socioeconomic Post  28.43 %    Socioeconomic % Change   7.57 %    Psych/Spiritual Pre  29.14 %    Psych/Spiritual Post  30 %    Psych/Spiritual % Change  2.95 %    Family Pre  28.8 %    Family Post  30 %    Family % Change  4.17 %    GLOBAL Pre  27.32 %    GLOBAL Post  29.32 %    GLOBAL % Change  7.32 %       Personal Goals: Goals established at orientation with interventions provided to work toward goal. Personal Goals and Risk Factors at Admission - 12/18/18 0852      Core Components/Risk Factors/Patient Goals on Admission   Improve shortness of breath with ADL's  Yes    Intervention  Provide education, individualized exercise plan and daily activity instruction to help decrease symptoms of SOB with activities of daily living.    Expected Outcomes  Short Term: Improve cardiorespiratory fitness to achieve a  reduction of symptoms when performing ADLs;Long Term: Be able to perform more ADLs without symptoms or delay the onset of symptoms    Stress  Yes    Intervention  Offer individual and/or small group education and counseling on adjustment to heart disease, stress management and health-related lifestyle change. Teach and support self-help strategies.;Refer participants experiencing significant psychosocial distress to appropriate mental health specialists for further evaluation and treatment. When possible, include family members and significant others in education/counseling sessions.    Expected Outcomes  Short Term: Participant demonstrates changes in health-related behavior, relaxation and other stress management skills, ability to obtain effective social support, and compliance with psychotropic medications if  prescribed.;Long Term: Emotional wellbeing is indicated by absence of clinically significant psychosocial distress or social isolation.        Personal Goals Discharge: Goals and Risk Factor Review    Row Name 12/24/18 5035 12/28/18 0858 01/30/19 1707 02/18/19 0736       Core Components/Risk Factors/Patient Goals Review   Personal Goals Review  Stress;Improve shortness of breath with ADL's  Stress;Improve shortness of breath with ADL's  Stress;Improve shortness of breath with ADL's  Stress;Improve shortness of breath with ADL's    Review  Pt with few CAD RFs willing to participate in CR exercise.  Pt would like to increase stamina and decrease her SOB and weight.     Pt with few CAD RFs willing to participate in CR exercise.  Benetta has started exercise recently and is tolerating it well. VSS.  Pt with few CAD RFs willing to participate in CR exercise. Raiyah is tolerating exercise with inccreases in her workloads.  She feels that she is increasing her stamina and decreasing her shortness of breath.   Danniel has graduated from [facility] with 24 completed sessions. She feels that she has increased her confidence, fitness level, and her breathing has improved. Mychele tolerated exercise well throughout the program. Her insurance only allowed 24 sessions.    Expected Outcomes  Pt will continue to participate in CR exercise, nutrition, and lifestyle modification opportunities.   Pt will continue to participate in CR exercise, nutrition, and lifestyle modification opportunities.   Pt will continue to participate in CR exercise, nutrition, and lifestyle modification opportunities.   Pt will continue to participate in exercise, nutrition, and lifestyle modification opportunities. Abisola plans to work out at [employer] where she is already working out on the weekends.        Exercise Goals and Review: Exercise Goals    Row Name 12/18/18 0849             Exercise Goals   Increase Physical Activity  Yes        Intervention  Provide advice, education, support and counseling about physical activity/exercise needs.;Develop an individualized exercise prescription for aerobic and resistive training based on initial evaluation findings, risk stratification, comorbidities and participant's personal goals.       Expected Outcomes  Short Term: Attend rehab on a regular basis to increase amount of physical activity.;Long Term: Add in home exercise to make exercise part of routine and to increase amount of physical activity.;Long Term: Exercising regularly at least 3-5 days a week.       Increase Strength and Stamina  Yes       Intervention  Provide advice, education, support and counseling about physical activity/exercise needs.;Develop an individualized exercise prescription for aerobic and resistive training based on initial evaluation findings, risk stratification, comorbidities and participant's personal goals.  Expected Outcomes  Short Term: Increase workloads from initial exercise prescription for resistance, speed, and METs.;Short Term: Perform resistance training exercises routinely during rehab and add in resistance training at home;Long Term: Improve cardiorespiratory fitness, muscular endurance and strength as measured by increased METs and functional capacity (6MWT)       Able to understand and use rate of perceived exertion (RPE) scale  Yes       Intervention  Provide education and explanation on how to use RPE scale       Expected Outcomes  Short Term: Able to use RPE daily in rehab to express subjective intensity level;Long Term:  Able to use RPE to guide intensity level when exercising independently       Knowledge and understanding of Target Heart Rate Range (THRR)  Yes       Intervention  Provide education and explanation of THRR including how the numbers were predicted and where they are located for reference       Expected Outcomes  Short Term: Able to state/look up THRR;Long Term: Able to use  THRR to govern intensity when exercising independently;Short Term: Able to use daily as guideline for intensity in rehab       Able to check pulse independently  Yes       Intervention  Provide education and demonstration on how to check pulse in carotid and radial arteries.;Review the importance of being able to check your own pulse for safety during independent exercise       Expected Outcomes  Short Term: Able to explain why pulse checking is important during independent exercise;Long Term: Able to check pulse independently and accurately       Understanding of Exercise Prescription  Yes       Intervention  Provide education, explanation, and written materials on patient's individual exercise prescription       Expected Outcomes  Short Term: Able to explain program exercise prescription;Long Term: Able to explain home exercise prescription to exercise independently          Exercise Goals Re-Evaluation: Exercise Goals Re-Evaluation    Row Name 01/16/19 1344 02/21/19 1515           Exercise Goal Re-Evaluation   Exercise Goals Review  Increase Physical Activity;Able to understand and use rate of perceived exertion (RPE) scale;Understanding of Exercise Prescription;Knowledge and understanding of Target Heart Rate Range (THRR);Increase Strength and Stamina;Able to check pulse independently  Understanding of Exercise Prescription;Increase Physical Activity      Comments  Reviewed HEP with pt. Also reviewed THRR, RPE Scale, weather conditions, endpoints of exericse, NTG use, when to call 911, warmup and cool down.   Pt completed 24 sessions of rehab. Pt increased fucntional capacity by 53.55%. Pt increase post 6MWT distance by 670f. Pt increased single leg balance test from 30 secs to 35 secs. Pt's goal was to increase stamina and decrease SOB. Pt states she has met her goals and is thankful for program.       Expected Outcomes  Pt will continue to exercise 2-3 days at local [employer]  30-45 minutes with husband. Pt will continue to increase cardiovascular strength. Will continue to monitor.   Pt will continue to exercise 3-4 days a week for 30-45 minutes. [facility] [employer]. Pt very motivated to continue exercising.          Nutrition & Weight - Outcomes: Pre Biometrics - 12/18/18 0745      Pre Biometrics  Height  5' 8"  (1.727 m)    Weight  73.2 kg    Waist Circumference  33 inches    Hip Circumference  42.5 inches    Waist to Hip Ratio  0.78 %    BMI (Calculated)  24.54    Triceps Skinfold  35 mm    % Body Fat  35.9 %    Grip Strength  30 kg    Flexibility  16.5 in    Single Leg Stand  30 seconds      Post Biometrics - 02/08/19 1013       Post  Biometrics   Height  5' 8"  (1.727 m)    Weight  72.2 kg    Waist Circumference  30.5 inches    Hip Circumference  38 inches    Waist to Hip Ratio  0.8 %    BMI (Calculated)  24.21    Triceps Skinfold  34 mm    % Body Fat  34.7 %    Grip Strength  33 kg    Flexibility  18.5 in    Single Leg Stand  35 seconds       Nutrition: Nutrition Therapy & Goals - 12/18/18 1248      Nutrition Therapy   Diet  heart healthy      Personal Nutrition Goals   Nutrition Goal  Pt to identify and limit food sources of saturated fat, trans fat, refined carbohydrates and sodium    Personal Goal #2  Pt to read labels    Personal Goal #3  Pt able to name foods that affect blood glucose      Intervention Plan   Intervention  Prescribe, educate and counsel regarding individualized specific dietary modifications aiming towards targeted core components such as weight, hypertension, lipid management, diabetes, heart failure and other comorbidities.    Expected Outcomes  Short Term Goal: Understand basic principles of dietary content, such as calories, fat, sodium, cholesterol and nutrients.;Long Term Goal: Adherence to prescribed nutrition plan.       Nutrition Discharge: Nutrition Assessments -  03/14/19 0841      MEDFICTS Scores   Pre Score  32    Post Score  --   pt did not return post survey      Education Questionnaire Score: Knowledge Questionnaire Score - 02/06/19 1016      Knowledge Questionnaire Score   Pre Score  24/24    Post Score  24/24       Goals reviewed with patient; copy given to patient.

## 2019-03-15 ENCOUNTER — Encounter (HOSPITAL_COMMUNITY): Payer: 59

## 2019-03-18 ENCOUNTER — Encounter (HOSPITAL_COMMUNITY): Payer: 59

## 2019-03-20 ENCOUNTER — Encounter (HOSPITAL_COMMUNITY): Payer: 59

## 2019-03-21 IMAGING — DX DG CHEST 2V
2 series · 2 of 2 positions shown · non-contrast
Comparison: Radiographs September 24, 2018.

CLINICAL DATA: Dyspnea on exertion.

EXAM:
CHEST - 2 VIEW

[dg chest 2 view (1 of 2)]
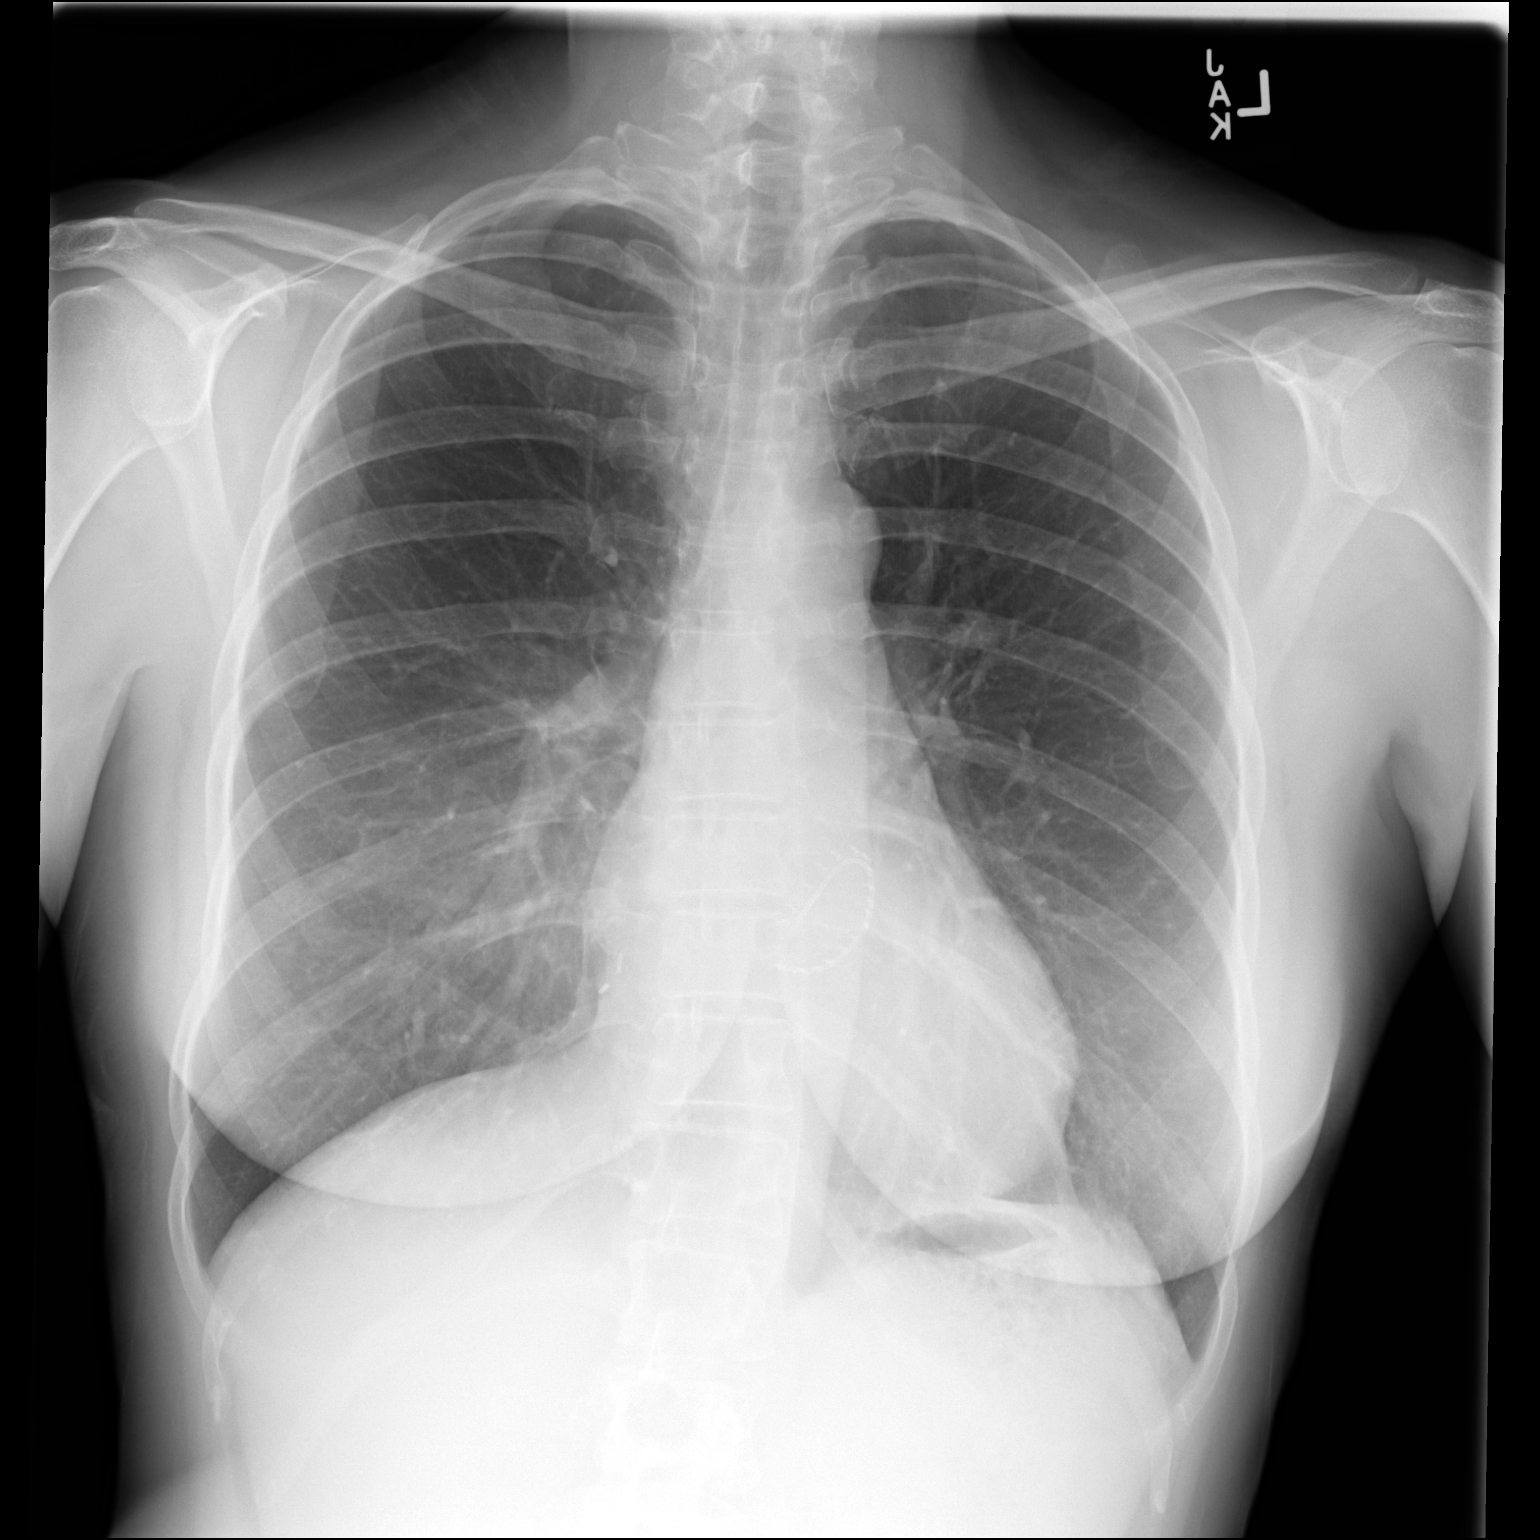

[dg chest 2 view (2 of 2)]
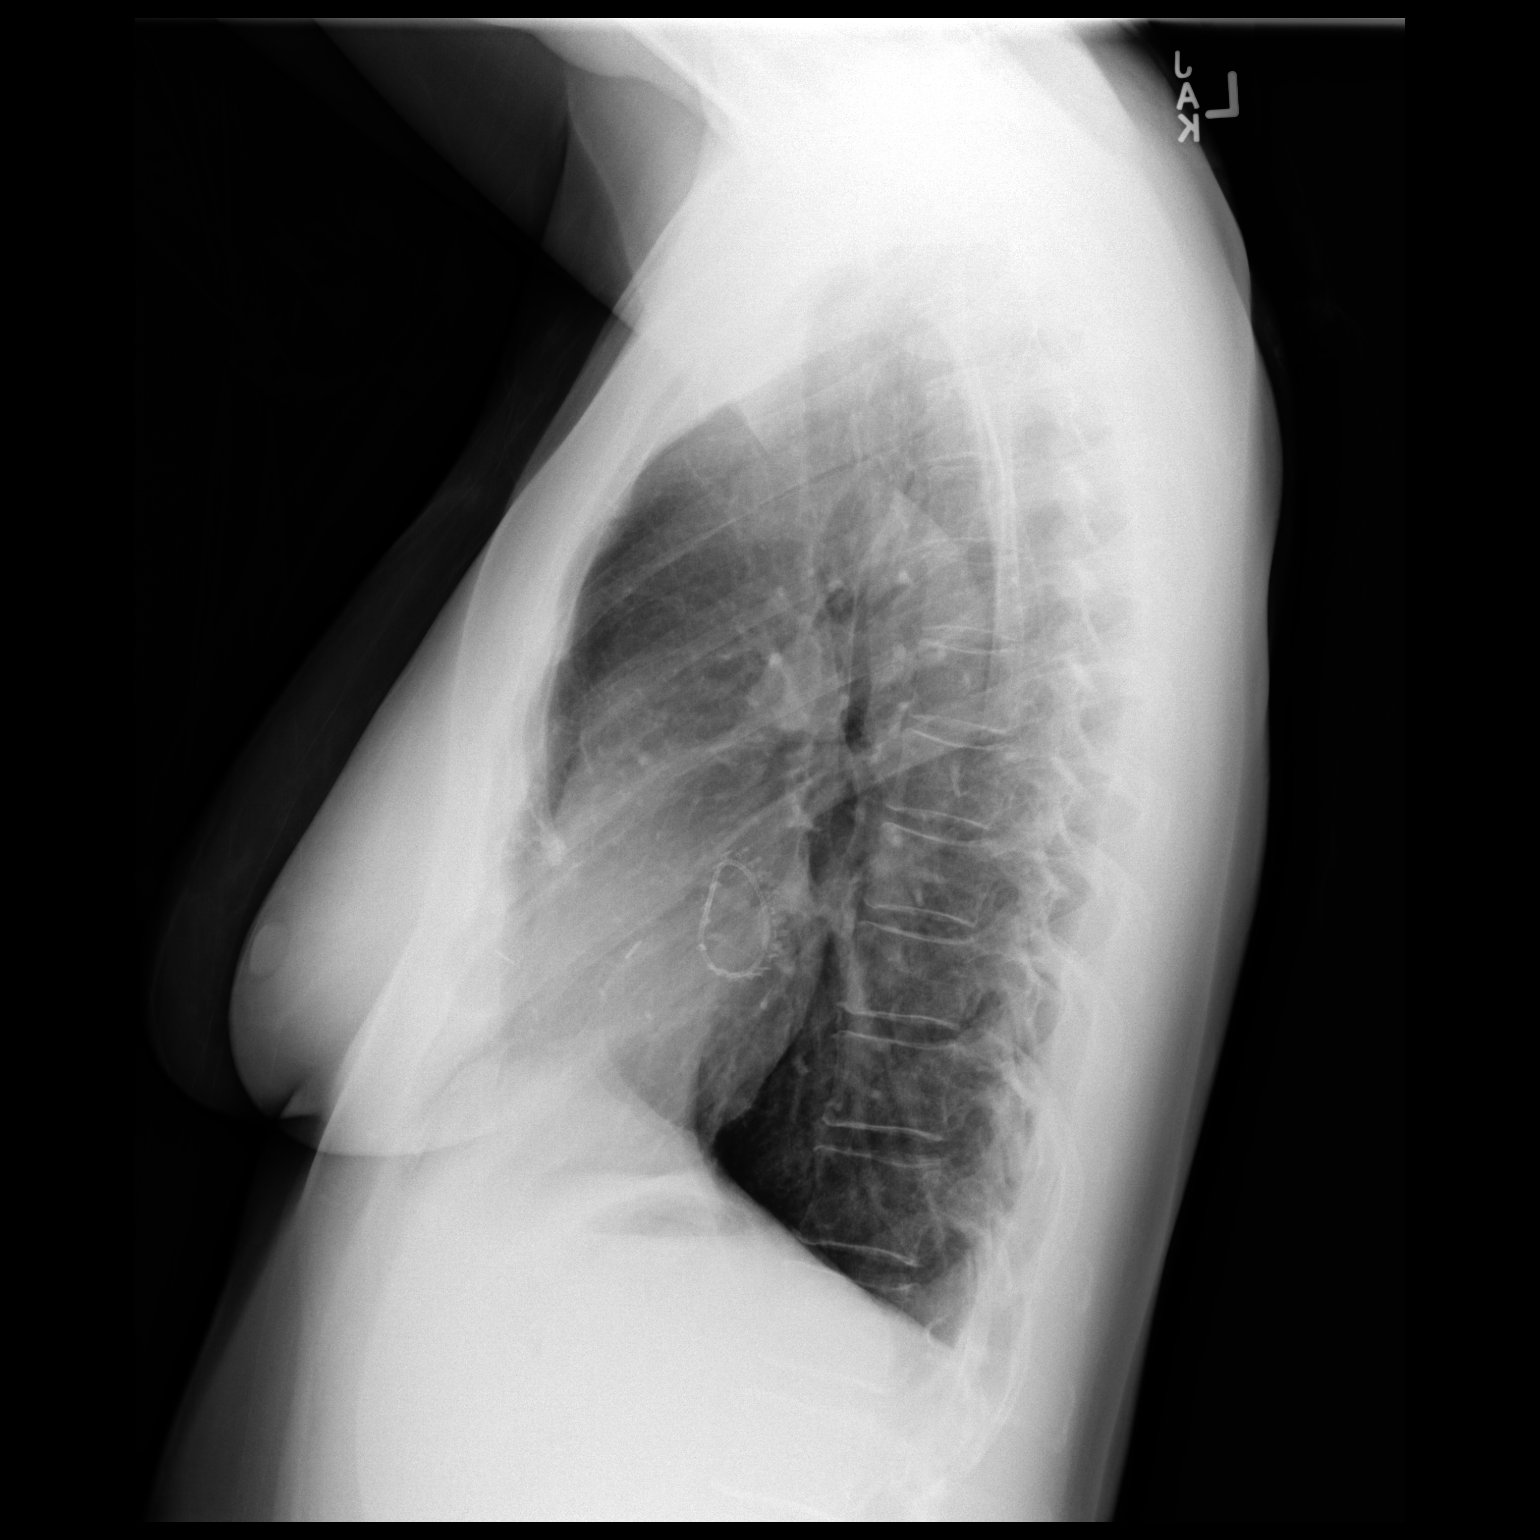

[2 of 2 positions shown; findings below may reference images not displayed]

FINDINGS: The heart size and mediastinal contours are within normal limits.
Status post cardiac valve repair. No pneumothorax or pleural
effusion is noted. Both lungs are clear. The visualized skeletal
structures are unremarkable.
IMPRESSION: No active cardiopulmonary disease.

## 2019-03-22 ENCOUNTER — Encounter (HOSPITAL_COMMUNITY): Payer: 59

## 2019-03-25 ENCOUNTER — Encounter (HOSPITAL_COMMUNITY): Payer: 59

## 2019-03-27 ENCOUNTER — Encounter (HOSPITAL_COMMUNITY): Payer: 59

## 2019-05-28 ENCOUNTER — Telehealth: Payer: Self-pay

## 2019-05-28 NOTE — Telephone Encounter (Signed)
Obtained verbal consent from pt for video visit. Pt will have vital signs ready for visit.    YOUR CARDIOLOGY TEAM HAS ARRANGED FOR AN E-VISIT FOR YOUR APPOINTMENT - PLEASE REVIEW IMPORTANT INFORMATION BELOW SEVERAL DAYS PRIOR TO YOUR APPOINTMENT  Due to the recent COVID-19 pandemic, we are transitioning in-person office visits to tele-medicine visits in an effort to decrease unnecessary exposure to our patients, their families, and staff. These visits are billed to your insurance just like a normal visit is. We also encourage you to sign up for MyChart if you have not already done so. You will need a smartphone if possible. For patients that do not have this, we can still complete the visit using a regular telephone but do prefer a smartphone to enable video when possible. You may have a family member that lives with you that can help. If possible, we also ask that you have a blood pressure cuff and scale at home to measure your blood pressure, heart rate and weight prior to your scheduled appointment. Patients with clinical needs that need an in-person evaluation and testing will still be able to come to the office if absolutely necessary. If you have any questions, feel free to call our office.     YOUR PROVIDER WILL BE USING THE FOLLOWING PLATFORM TO COMPLETE YOUR VISIT: [email or web address]  . IF USING MYCHART - How to Download the MyChart App to Your SmartPhone   - If Apple, go to [employer] and type in MyChart in the search bar and download the app. If Android, ask patient to go to [employer] and type in [place] in the search bar and download the app. The app is free but as with any other app downloads, your phone may require you to verify saved payment information or Apple/Android password.  - You will need to then log into the app with your MyChart username and password, and select [facility] as your healthcare provider to link the account.  - When it is time for your visit, go to the MyChart app,  find appointments, and click Begin Video Visit. Be sure to Select Allow for your device to access the Microphone and Camera for your visit. You will then be connected, and your provider will be with you shortly.  **If you have any issues connecting or need assistance, please contact MyChart service desk [phone])**  **If using a computer, in order to ensure the best quality for your visit, you will need to use either of the following Internet Browsers: Insurance underwriter or [employer]**  . IF USING DOXIMITY or [email or web address] - The staff will give you instructions on receiving your link to join the meeting the day of your visit.      2-3 DAYS BEFORE YOUR APPOINTMENT  You will receive a telephone call from one of our [facility] team members - your caller ID may say "Unknown caller." If this is a video visit, we will walk you through how to get the video launched on your phone. We will remind you check your blood pressure, heart rate and weight prior to your scheduled appointment. If you have an Apple Watch or Kardia, please upload any pertinent ECG strips the day before or morning of your appointment to [place]. Our staff will also make sure you have reviewed the consent and agree to move forward with your scheduled tele-health visit.     THE DAY OF YOUR APPOINTMENT  Approximately 15 minutes prior to your scheduled appointment, you  will receive a telephone call from one of [facility] team - your caller ID may say "Unknown caller."  Our staff will confirm medications, vital signs for the day and any symptoms you may be experiencing. Please have this information available prior to the time of visit start. It may also be helpful for you to have a pad of paper and pen handy for any instructions given during your visit. They will also walk you through joining the smartphone meeting if this is a video visit.    CONSENT FOR TELE-HEALTH VISIT - PLEASE REVIEW  I hereby voluntarily request,  consent and authorize [facility] and its employed or contracted physicians, physician assistants, nurse practitioners or other licensed health care professionals (the Practitioner), to provide me with telemedicine health care services (the "Services") as deemed necessary by the treating Practitioner. I acknowledge and consent to receive the Services by the Practitioner via telemedicine. I understand that the telemedicine visit will involve communicating with the Practitioner through live audiovisual communication technology and the disclosure of certain medical information by electronic transmission. I acknowledge that I have been given the opportunity to request an in-person assessment or other available alternative prior to the telemedicine visit and am voluntarily participating in the telemedicine visit.  I understand that I have the right to withhold or withdraw my consent to the use of telemedicine in the course of my care at any time, without affecting my right to future care or treatment, and that the Practitioner or I may terminate the telemedicine visit at any time. I understand that I have the right to inspect all information obtained and/or recorded in the course of the telemedicine visit and may receive copies of available information for a reasonable fee.  I understand that some of the potential risks of receiving the Services via telemedicine include:  Marland Kitchen Delay or interruption in medical evaluation due to technological equipment failure or disruption; . Information transmitted may not be sufficient (e.g. poor resolution of images) to allow for appropriate medical decision making by the Practitioner; and/or  . In rare instances, security protocols could fail, causing a breach of personal health information.  Furthermore, I acknowledge that it is my responsibility to provide information about my medical history, conditions and care that is complete and accurate to the best of my ability. I  acknowledge that Practitioner's advice, recommendations, and/or decision may be based on factors not within their control, such as incomplete or inaccurate data provided by me or distortions of diagnostic images or specimens that may result from electronic transmissions. I understand that the practice of medicine is not an exact science and that Practitioner makes no warranties or guarantees regarding treatment outcomes. I acknowledge that I will receive a copy of this consent concurrently upon execution via email to the email address I last provided but may also request a printed copy by calling the office of [facility].    I understand that my insurance will be billed for this visit.   I have read or had this consent read to me. . I understand the contents of this consent, which adequately explains the benefits and risks of the Services being provided via telemedicine.  . I have been provided ample opportunity to ask questions regarding this consent and the Services and have had my questions answered to my satisfaction. . I give my informed consent for the services to be provided through the use of telemedicine in my medical care  By participating in this telemedicine visit I  agree to the above.

## 2019-06-06 ENCOUNTER — Other Ambulatory Visit: Payer: Self-pay

## 2019-06-06 ENCOUNTER — Encounter: Payer: Self-pay | Admitting: Cardiovascular Disease

## 2019-06-06 ENCOUNTER — Telehealth (INDEPENDENT_AMBULATORY_CARE_PROVIDER_SITE_OTHER): Payer: 59 | Admitting: Cardiovascular Disease

## 2019-06-06 VITALS — BP 95/67 | HR 95 | Ht 67.75 in | Wt 154.2 lb

## 2019-06-06 DIAGNOSIS — I341 Nonrheumatic mitral (valve) prolapse: Secondary | ICD-10-CM

## 2019-06-06 DIAGNOSIS — Z9889 Other specified postprocedural states: Secondary | ICD-10-CM

## 2019-06-06 DIAGNOSIS — I493 Ventricular premature depolarization: Secondary | ICD-10-CM

## 2019-06-06 DIAGNOSIS — I480 Paroxysmal atrial fibrillation: Secondary | ICD-10-CM | POA: Diagnosis not present

## 2019-06-06 DIAGNOSIS — Z7189 Other specified counseling: Secondary | ICD-10-CM | POA: Diagnosis not present

## 2019-06-06 NOTE — Patient Instructions (Signed)
Medication Instructions:  Your physician has recommended you make the following change in your medication:  TRY taking Metoprolol succinate (Toprol XL) 12.5 mg twice daily - you may discontinue if it makes you too tired You may also try taking Metoprolol tartrate (Lopressor) 12.5 twice daily to see if heart rate is lower If you need a refill on your cardiac medications before your next appointment, please call your pharmacy.    Lab work: None Ordered    Testing/Procedures: None Ordered   Follow-Up: At [facility], you and your health needs are our priority.  As part of our continuing mission to provide you with exceptional heart care, we have created designated Provider Care Teams.  These Care Teams include your primary Cardiologist (physician) and Advanced Practice Providers (APPs -  Physician Assistants and Nurse Practitioners) who all work together to provide you with the care you need, when you need it. You will need a follow up appointment in:  6 months.  Please call our office 2 months in advance to schedule this appointment.  You may see [name], MD or one of the following Advanced Practice Providers on your designated Care Team: Richardson Dopp, PA-C [place], [place] . Daune Perch, NP

## 2019-06-06 NOTE — Progress Notes (Signed)
Virtual Visit via Video Note   This visit type was conducted due to national recommendations for restrictions regarding the COVID-19 Pandemic (e.g. social distancing) in an effort to limit this patient's exposure and mitigate transmission in our community.  Due to her co-morbid illnesses, this patient is at least at moderate risk for complications without adequate follow up.  This format is felt to be most appropriate for this patient at this time.  All issues noted in this document were discussed and addressed.  A limited physical exam was performed with this format.  Please refer to the patient's chart for her consent to telehealth for [facility]   Date:  06/06/2019   ID:  Kara Owens, DOB 08/08/1971, MRN [id number]  Patient Location: Home Provider Location: Home  PCP:  [name], MD  Cardiologist:  [name], MD  Electrophysiologist:  None   Kara Owens is a 48 y.o. female with a hx of mitral valve prolapse.  I last saw Kara Owens in 2016.  He has a history of mitral valve prolapse with mild mitral regurgitation.  Mother Kara Owens ) had an ablation last year. Brother had an MI at age 78. ( non smoker, HTN, obese)  Had labs at [name] [facility] office last week.  Had some pleuretic CP last week,    Was given prednisone which helped No cough, cold, no hemoptysis,  No fever, Pain was worse at nihgt.   Trying to exercise  Works in [employer].     Labs from Primary MD   Total chol = 187 Trigs = 70 HDL = 82 LDL = 91   February 23, 2018 Seen with husband  BP was elevated at the TEE  Brought her BP log , all readings were great  Is not exercising regularly   June 08, 2018:  Son is seen back today for follow-up of her mitral valve prolapse.  She has worn an event monitor and was found to have episodes of nonsustained ventricular tachycardia.  We started her on metoprolol 25 mg twice a day which seemed to help with the episodes of nonsustained VT.   We tried increasing the dose to 25 mg 3 times a day but she does  not tolerate this vey well at all due to profound fatigue   She had a stress Myoview study on May 8 which revealed an anterior defect that was thought to be due to breast artifact.    Coronary CT angiogram revealed normal coronary arteries.  She had a coronary calcium score of 0.  Had an episode of chest tightness.   Occurs after the palpitations. Last for a few minutes. Marland Kitchen   Resolves with deep breathing .  July 10, 2018:  Seen with husband , Kara Owens She has a little bit more energy now that she has reduce the metoprolol from 25 mg 3 times daily to 25 mg twice daily.  She still has palpitations He thinks the palpitations may have worsened slightly since reducing the dose of metoprolol.  Oct. 21, 2019  Kara Owens is back - had MV repiar on sept. 25,  Had PAF - is on amio  December 12, 2018:  Kara Owens is seen today for follow up for MV repair   - with Kara Owens Husband.  Still very sore Walks on weekend   Legs started swelling last week .   Has Several neurologic issues that are not clear.  She is having some dizziness.  She also has had some loss  of memory at times.  She also has some hand shaking.  We discussed the fact that she would need to go to see a neurologist if these continued.  Feb. 19, 2020  Overall doing well .  HR has been elevated.  Staying hydrated.    Evaluation Performed:  Follow-Up Visit  Chief Complaint:  Follow up MVP / mitral valve repair  JUne 11,2020    Kara Owens is a 48 y.o. female with history of mitral valve prolapse.  She status post mitral valve repair by Dr. Roxy Manns .  She is done extremely well. VS are stable  HR is a little fast,   Tried metoprolol 25 mg a day but this caused her to be too fatigue Doing better on 12.5 mg toprol . Tolerated metoprolol tartrate in the past   Getting some exercise .     Is 9 months out from her MV repair  No CP or dyspnea  Has rare arrhythmias -  PVCs ,   The patient does not have symptoms concerning for COVID-19 infection (fever, chills, cough, or new shortness of breath).    Past Medical History:  Diagnosis Date  . Arrhythmia    post op atrial fibrillation  . Chest pain    Left sided--sharp pain  . Complication of anesthesia   . Fatigue    Extreme  . Mitral regurgitation    Mild  . Mitral valve prolapse   . Palpitations   . PONV (postoperative nausea and vomiting)   . S/P minimally invasive mitral valve repair 09/19/2018   Artificial Gore-tex neochord placement x10 with 32 mm Sorin Memo 4D ring annuloplasty via right mini thoracotomy approach   Past Surgical History:  Procedure Laterality Date  . EYE SURGERY Left    retinal repair   . MITRAL VALVE REPAIR Right 09/19/2018   Procedure: MINIMALLY INVASIVE MITRAL VALVE REPAIR (MVR). 44mm MEMO 4D RING.;  Surgeon: [name], MD;  Location: [place];  Service: Open Heart Surgery;  Laterality: Right;  GLUTARALDEHYDE  . TEE WITHOUT CARDIOVERSION N/A 12/08/2017   Procedure: TRANSESOPHAGEAL ECHOCARDIOGRAM (TEE);  Surgeon: [name], MD;  Location: [place];  Service: Cardiovascular;  Laterality: N/A;  . TEE WITHOUT CARDIOVERSION N/A 07/27/2018   Procedure: TRANSESOPHAGEAL ECHOCARDIOGRAM (TEE);  Surgeon: [name], MD;  Location[facility] ENDOSCOPY;  Service: Cardiovascular;  Laterality: N/A;  . TEE WITHOUT CARDIOVERSION N/A 09/19/2018   Procedure: TRANSESOPHAGEAL ECHOCARDIOGRAM (TEE);  Surgeon: [name], MD;  Location: [place];  Service: Open Heart Surgery;  Laterality: N/A;  . TONSILLECTOMY    . TONSILLECTOMY       Current Meds  Medication Sig  . acetaminophen (TYLENOL) 500 MG tablet Take 1,000 mg by mouth every 6 (six) hours as needed (for pain).  Marland Kitchen amoxicillin (AMOXIL) 500 MG tablet Take 2 grams 1 hour prior to dental work  . aspirin EC 81 MG EC tablet Take 1 tablet (81 mg total) by mouth daily.  Marland Kitchen CALCIUM CARBONATE-VITAMIN D PO Take 1 tablet by mouth  daily.  . metoprolol succinate (TOPROL-XL) 25 MG 24 hr tablet Take 12.5 mg by mouth daily.  . Multiple Vitamins-Minerals (AIRBORNE) CHEW Chew 1 tablet by mouth 2 (two) times daily as needed (FOR IMMUNE HEALTH).  Marland Kitchen polyethylene glycol (MIRALAX / GLYCOLAX) packet Take 17 g by mouth every other day.      Allergies:   Patient has no known allergies.   Social History   Tobacco Use  . Smoking status: Never Smoker  .  Smokeless tobacco: Never Used  Substance Use Topics  . Alcohol use: Never    Frequency: Never  . Drug use: Never     Family Hx: The patient's family history includes Heart attack in her brother; Heart failure in her mother; Hypertension in her father and mother; Skin cancer in her father.  ROS:   Please see the history of present illness.     All other systems reviewed and are negative.   Prior CV studies:   The following studies were reviewed today:    Labs/Other Tests and Data Reviewed:    EKG:  No ECG reviewed.  Recent Labs: 09/17/2018: ALT 17 09/20/2018: Magnesium 2.4 09/22/2018: BUN 6; Creatinine, Ser 0.52; Hemoglobin 8.8; Platelets 109; Potassium 3.6; Sodium 137   Recent Lipid Panel Lab Results  Component Value Date/Time   CHOL 175 09/12/2008 09:13 AM   TRIG 40 09/12/2008 09:13 AM   HDL 57.8 09/12/2008 09:13 AM   CHOLHDL 3.0 CALC 09/12/2008 09:13 AM   LDLCALC 109 (H) 09/12/2008 09:13 AM    Wt Readings from Last 3 Encounters:  06/06/19 154 lb 3.2 oz (69.9 kg)  02/13/19 160 lb 9.6 oz (72.8 kg)  02/08/19 159 lb 2.8 oz (72.2 kg)     Objective:    Vital Signs:  BP 95/67 (BP Location: Right Arm, Patient Position: Sitting, Cuff Size: Normal)   Pulse 95   Ht 5' 7.75" (1.721 m)   Wt 154 lb 3.2 oz (69.9 kg)   BMI 23.62 kg/m    VITAL SIGNS:  reviewed GEN:  no acute distress EYES:  sclerae anicteric, EOMI - Extraocular Movements Intact RESPIRATORY:  normal respiratory effort, symmetric expansion CARDIOVASCULAR:  no peripheral edema SKIN:  no rash,  lesions or ulcers. MUSCULOSKELETAL:  no obvious deformities. NEURO:  alert and oriented x 3, no obvious focal deficit PSYCH:  normal affect  ASSESSMENT & PLAN:    1. MVP - s/p MV repair. Blythe is doing very well.  She has completed cardiac rehab.  She exercises on a daily basis.  She is  Toprol-XL 12.5 mg once  a day.  She previously was on metoprolol tartrate.  I would like for her to try metoprolol tartrate 12.5 mg twice a day if that helps get her heart rate down little bit.  I have encouraged her to stay hydrated.  We have encouraged V8 juice.  She does not particularly like V8 juice so I have given her the okay to have chicken noodle soup once a week.   COVID-19 Education: The signs and symptoms of COVID-19 were discussed with the patient and how to seek care for testing (follow up with PCP or arrange E-visit).  The importance of social distancing was discussed today.  Time:   Today, I have spent  19  minutes with the patient with telehealth technology discussing the above problems.     Medication Adjustments/Labs and Tests Ordered: Current medicines are reviewed at length with the patient today.  Concerns regarding medicines are outlined above.   Tests Ordered: No orders of the defined types were placed in this encounter.   Medication Changes: No orders of the defined types were placed in this encounter.   Disposition:  Follow up in 6 month(s)  Signed, [name], MD  06/06/2019 8:52 AM    [facility]

## 2019-09-01 ENCOUNTER — Other Ambulatory Visit: Payer: Self-pay | Admitting: Cardiovascular Disease

## 2019-09-04 ENCOUNTER — Other Ambulatory Visit: Payer: Self-pay

## 2019-09-04 NOTE — Telephone Encounter (Signed)
Error

## 2019-09-16 ENCOUNTER — Other Ambulatory Visit: Payer: Self-pay

## 2019-09-16 ENCOUNTER — Encounter: Payer: 59 | Admitting: Thoracic Surgery (Cardiothoracic Vascular Surgery)

## 2019-09-16 ENCOUNTER — Ambulatory Visit: Payer: 59 | Admitting: Thoracic Surgery (Cardiothoracic Vascular Surgery)

## 2019-09-16 ENCOUNTER — Encounter: Payer: Self-pay | Admitting: Thoracic Surgery (Cardiothoracic Vascular Surgery)

## 2019-09-16 VITALS — BP 102/60 | HR 83 | Temp 97.9°F | Resp 16 | Ht 68.0 in | Wt 160.0 lb

## 2019-09-16 DIAGNOSIS — Z9889 Other specified postprocedural states: Secondary | ICD-10-CM | POA: Diagnosis not present

## 2019-09-16 NOTE — Progress Notes (Signed)
[address]       [place] [address]             [phone]     CARDIOTHORACIC SURGERY OFFICE NOTE  Primary Cardiologist is [name], MD PCP is [name], MD   HPI:  Patient is a 48 year old female who returns to the office today for routine follow-up status post minimally invasive mitral valve repair on September 19, 2018 for mitral valve prolapse with severe symptomatic primary mitral regurgitation. Prior to surgery she presented with frequent palpitations associated with episodes of nonsustained ventricular tachycardia on a cardiac event monitor and mild symptoms of exertional shortness of breath. Postoperatively she has done very well.  Routine follow-up echocardiogram performed December 24, 2018 demonstrated normal left ventricular systolic function with intact mitral valve repair and no residual mitral regurgitation.  She returns to our office today and reports that she is doing very well.  Overall she feels "much improved" in comparison with how she felt prior to surgery.  She is back to normal activity and gets along without any problems or limitations.  She states that she gets short of breath only with very strenuous exertion and this does not limit her to any significant degree.  She denies any history of chest pain or chest tightness other than mild occasional discomfort in her right chest related to the previous surgery.  This is not really painful but more a sensation of numbness where the previous chest tubes were and occasional twinges in the upper chest associated with specific physical movements.  Overall she reports no physical limitations and is very pleased with her outcome.    Current Outpatient Medications  Medication Sig Dispense Refill  . acetaminophen (TYLENOL) 500 MG tablet Take 1,000 mg by mouth every 6 (six) hours as needed (for pain).    Marland Kitchen amoxicillin (AMOXIL) 500 MG tablet Take 2 grams 1 hour prior to dental work 4 tablet 2  .  aspirin EC 81 MG EC tablet Take 1 tablet (81 mg total) by mouth daily.    Marland Kitchen CALCIUM CARBONATE-VITAMIN D PO Take 1 tablet by mouth daily.    . metoprolol tartrate (LOPRESSOR) 25 MG tablet Take 12.5 mg by mouth 2 (two) times daily.    . Multiple Vitamins-Minerals (AIRBORNE) CHEW Chew 1 tablet by mouth 2 (two) times daily as needed (FOR IMMUNE HEALTH).    Marland Kitchen polyethylene glycol (MIRALAX / GLYCOLAX) packet Take 17 g by mouth every other day.      No current facility-administered medications for this visit.       Physical Exam:   BP 102/60 (BP Location: Right Arm, Patient Position: Sitting, Cuff Size: Normal)   Pulse 83   Temp 97.9 F (36.6 C)   Resp 16   Ht 5\' 8"  (1.727 m)   Wt 160 lb (72.6 kg)   SpO2 97% Comment: RA  BMI 24.33 kg/m   General:  Well-appearing  Chest:   Clear to auscultation  CV:   Regular rate and rhythm without murmur  Incisions:  Completely healed  Abdomen:  Soft nontender  Extremities:  Warm and well-perfused  Diagnostic Tests:  Transthoracic Echocardiography   Patient:    Kara Owens, Kara Owens MR #:       [id number] Study Date: 12/24/2018 Gender:     F Age:        73 Height:     172.7 cm Weight:     73.2 kg BSA:  1.88 m^2 Pt. Status: Room:    REFERRING    [name], M.D.  ATTENDING    Kirk Ruths  PERFORMING   [facility], Outpatient  SONOGRAPHER  [facility], RDCS  ORDERING     [name], Jr  REFERRING    [name], Jr   cc:   ------------------------------------------------------------------- LV EF: 50% -   55%   ------------------------------------------------------------------- Indications:      Mitral Valve Disease (I05.9).   ------------------------------------------------------------------- History:   PMH:  Status Post minimally invasive Mitral Valve Repair (09-19-18) with post-op Atrial Fibrillation.  Chest pain.  Dyspnea. Atrial fibrillation.  Mitral valve disease.  Mitral valve prolapse.  Risk factors:  Family history of coronary  artery disease.   ------------------------------------------------------------------- Study Conclusions   - Left ventricle: The cavity size was normal. Wall thickness was   normal. Systolic function was normal. The estimated ejection   fraction was in the range of 50% to 55%. Wall motion was normal;   there were no regional wall motion abnormalities. The study is   not technically sufficient to allow evaluation of LV diastolic   function. - Mitral valve: Prior procedures included surgical repair.   Impressions:   - Normal LV function; s/p MV repair with normal mean gradient and   no MR.   ------------------------------------------------------------------- Study data:  Comparison was made to the study of 09/19/2018.  Study status:  Routine.  Procedure:  Transthoracic echocardiography. Image quality was adequate.          Transthoracic echocardiography.  M-mode, complete 2D, spectral Doppler, and color Doppler.  Birthdate:  Patient birthdate: 1971-06-29.  Age:  Patient is 48 yr old.  Sex:  Gender: female.    BMI: 24.5 kg/m^2.  Blood pressure:     122/90  Patient status:  Outpatient.  Study date: Study date: 12/24/2018. Study time: 02:08 PM.  Location:  [facility]   -------------------------------------------------------------------   ------------------------------------------------------------------- Left ventricle:  The cavity size was normal. Wall thickness was normal. Systolic function was normal. The estimated ejection fraction was in the range of 50% to 55%. Wall motion was normal; there were no regional wall motion abnormalities. The study is not technically sufficient to allow evaluation of LV diastolic function.   ------------------------------------------------------------------- Aortic valve:   Trileaflet; mildly thickened leaflets. Mobility was not restricted.  Doppler:  Transvalvular velocity was within the normal range. There was no stenosis. There was no  regurgitation.   ------------------------------------------------------------------- Aorta:  Aortic root: The aortic root was normal in size.   ------------------------------------------------------------------- Mitral valve:  Prior procedures included surgical repair. Mobility was not restricted.  Doppler:  Transvalvular velocity was within the normal range. There was no evidence for stenosis. There was no regurgitation.    Valve area by pressure half-time: 3.06 cm^2. Indexed valve area by pressure half-time: 1.63 cm^2/m^2. Indexed valve area by continuity equation (using LVOT flow): 0.83 cm^2/m^2.    Mean gradient (D): 3 mm Hg. Peak gradient (D): 7 mm Hg.   ------------------------------------------------------------------- Left atrium:  The atrium was normal in size.   ------------------------------------------------------------------- Right ventricle:  The cavity size was normal. Systolic function was normal.   ------------------------------------------------------------------- Pulmonic valve:    Doppler:  Transvalvular velocity was within the normal range. There was no evidence for stenosis.   ------------------------------------------------------------------- Tricuspid valve:   Structurally normal valve.    Doppler: Transvalvular velocity was within the normal range. There was trivial regurgitation.   ------------------------------------------------------------------- Pulmonary artery:   Systolic pressure was within the normal range.   ------------------------------------------------------------------- Right  atrium:  The atrium was normal in size.   ------------------------------------------------------------------- Pericardium:  There was no pericardial effusion.   ------------------------------------------------------------------- Systemic veins: Inferior vena cava: The vessel was normal in size.   -------------------------------------------------------------------  Measurements    Left ventricle                           Value          Reference  LV ID, ED, PLAX chordal                  44.2  mm       43 - 52  LV ID, ES, PLAX chordal                  26.1  mm       23 - 38  LV fx shortening, PLAX chordal           41    %        >=29  LV PW thickness, ED                      7.81  mm       ----------  IVS/LV PW ratio, ED                      1.22           <=1.3  Stroke volume, 2D                        56    ml       ----------  Stroke volume/bsa, 2D                    30    ml/m^2   ----------  LV e&', lateral                           11    cm/s     ----------  LV E/e&', lateral                         12.36          ----------  LV e&', medial                            8.27  cm/s     ----------  LV E/e&', medial                          16.44          ----------  LV e&', average                           9.64  cm/s     ----------  LV E/e&', average                         14.12          ----------    Ventricular septum                       Value          Reference  IVS thickness, ED  9.5   mm       ----------    LVOT                                     Value          Reference  LVOT ID, S                               21    mm       ----------  LVOT area                                3.46  cm^2     ----------  LVOT peak velocity, S                    80.2  cm/s     ----------  LVOT mean velocity, S                    51.5  cm/s     ----------  LVOT VTI, S                              16.2  cm       ----------    Aorta                                    Value          Reference  Aortic root ID, ED                       33    mm       ----------  Ascending aorta ID, A-P, S               36    mm       ----------    Left atrium                              Value          Reference  LA ID, A-P, ES                           31    mm       ----------  LA ID/bsa, A-P                           1.65  cm/m^2   <=2.2  LA  volume, S                             44.9  ml       ----------  LA volume/bsa, S                         23.9  ml/m^2   ----------  LA volume, ES, 1-p A4C  47.6  ml       ----------  LA volume/bsa, ES, 1-p A4C               25.3  ml/m^2   ----------  LA volume, ES, 1-p A2C                   40.7  ml       ----------  LA volume/bsa, ES, 1-p A2C               21.6  ml/m^2   ----------    Mitral valve                             Value          Reference  Mitral E-wave peak velocity              136   cm/s     ----------  Mitral A-wave peak velocity              121   cm/s     ----------  Mitral mean velocity, D                  81.4  cm/s     ----------  Mitral deceleration time         (H)     310   ms       150 - 230  Mitral pressure half-time                72    ms       ----------  Mitral mean gradient, D                  3     mm Hg    ----------  Mitral peak gradient, D                  7     mm Hg    ----------  Mitral E/A ratio, peak                   1.1            ----------  Mitral valve area, PHT, DP               3.06  cm^2     ----------  Mitral valve area/bsa, PHT, DP           1.63  cm^2/m^2 ----------  Mitral valve area/bsa, LVOT              0.83  cm^2/m^2 ----------  continuity  Mitral annulus VTI, D                    35.8  cm       ----------    Pulmonary arteries                       Value          Reference  PA pressure, S, DP                       23    mm Hg    <=30    Tricuspid valve                          Value  Reference  Tricuspid regurg peak velocity           222   cm/s     ----------  Tricuspid peak RV-RA gradient            20    mm Hg    ----------    Right atrium                             Value          Reference  RA ID, S-I, ES, A4C                      48.8  mm       34 - 49  RA area, ES, A4C                         14.1  cm^2     8.3 - 19.5  RA volume, ES, A/L                       32.9  ml       ----------  RA  volume/bsa, ES, A/L                   17.5  ml/m^2   ----------    Systemic veins                           Value          Reference  Estimated CVP                            3     mm Hg    ----------    Right ventricle                          Value          Reference  TAPSE                                    20.6  mm       ----------  RV pressure, S, DP                       23    mm Hg    <=30  RV s&', lateral, S                        19.4  cm/s     ----------   Legend: (L)  and  (H)  mark values outside specified reference range.   ------------------------------------------------------------------- Prepared and Electronically Authenticated by   Kirk Ruths 2019-12-30T15:00:37    Impression:  Patient is doing very well approximately 1 year status post minimally invasive mitral valve repair  Plan:  The patient will continue to follow-up regularly with Dr. [name] and return to our office in the future only should specific problems or questions arise.  The patient has been reminded regarding the importance of dental hygiene and the lifelong need for antibiotic prophylaxis for all dental cleanings and other related invasive procedures.   I spent in excess of 15 minutes during the conduct  of this office consultation and >50% of this time involved direct face-to-face encounter with the patient for counseling and/or coordination of their care.   Valentina Gu. Roxy Manns, MD 09/16/2019 4:24 PM

## 2019-09-16 NOTE — Patient Instructions (Signed)

## 2019-12-03 ENCOUNTER — Encounter: Payer: Self-pay | Admitting: Cardiovascular Disease

## 2019-12-03 ENCOUNTER — Ambulatory Visit: Payer: 59 | Admitting: Cardiovascular Disease

## 2019-12-03 ENCOUNTER — Other Ambulatory Visit: Payer: Self-pay

## 2019-12-03 VITALS — BP 104/82 | HR 72 | Ht 68.0 in | Wt 159.4 lb

## 2019-12-03 DIAGNOSIS — I340 Nonrheumatic mitral (valve) insufficiency: Secondary | ICD-10-CM | POA: Diagnosis not present

## 2019-12-03 DIAGNOSIS — I341 Nonrheumatic mitral (valve) prolapse: Secondary | ICD-10-CM | POA: Diagnosis not present

## 2019-12-03 DIAGNOSIS — R002 Palpitations: Secondary | ICD-10-CM

## 2019-12-03 DIAGNOSIS — Z9889 Other specified postprocedural states: Secondary | ICD-10-CM

## 2019-12-03 NOTE — Patient Instructions (Signed)
Medication Instructions:  Your physician has recommended you make the following change in your medication:  STOP Metoprolol (Lopressor)  *If you need a refill on your cardiac medications before your next appointment, please call your pharmacy*  Lab Work: None Ordered    Testing/Procedures: None Ordered    Follow-Up: At [facility], you and your health needs are our priority.  As part of our continuing mission to provide you with exceptional heart care, we have created designated Provider Care Teams.  These Care Teams include your primary Cardiologist (physician) and Advanced Practice Providers (APPs -  Physician Assistants and Nurse Practitioners) who all work together to provide you with the care you need, when you need it.  Your next appointment:   6 month(s)  The format for your next appointment:   Either In Person or Virtual  Provider:   You may see [name], MD or one of the following Advanced Practice Providers on your designated Care Team:    Richardson Dopp, PA-C  [place], [place]  Daune Perch, [place]

## 2019-12-03 NOTE — Progress Notes (Signed)
Cardiology Office Note:    Date:  12/03/2019   ID:  Kara Owens, DOB 05/19/1971, MRN [id number]  PCP:  Kara Stains, MD  Cardiologist:  Kara Moores, MD    Referring MD: Kara Stains, MD   Chief Complaint  Patient presents with  . Mitral Valve Prolapse        Kara Owens is a 48 y.o. female with a hx of mitral valve prolapse.  I last saw Kara Owens in 2016.  He has a history of mitral valve prolapse with mild mitral regurgitation.  Mother Kara Owens ) had an ablation last year. Brother had an MI at age 84. ( non smoker, HTN, obese)  Had labs at Dr. Caren Owens [facility] office last week.  Had some pleuretic CP last week,    Was given prednisone which helped No cough, cold, no hemoptysis,  No fever, Pain was worse at nihgt.   Trying to exercise  Works in [employer].     Labs from Primary MD   Total chol = 187 Trigs = 70 HDL = 82 LDL = 91   February 23, 2018 Seen with husband  BP was elevated at the TEE  Brought her BP log , all readings were great  Is not exercising regularly   June 08, 2018:  Kara Owens is seen back today for follow-up of her mitral valve prolapse.  She has worn an event monitor and was found to have episodes of nonsustained ventricular tachycardia.  We started her on metoprolol 25 mg twice a day which seemed to help with the episodes of nonsustained VT.  We tried increasing the dose to 25 mg 3 times a day but she does  not tolerate this vey well at all due to profound fatigue   She had a stress Myoview study on May 8 which revealed an anterior defect that was thought to be due to breast artifact.    Coronary CT angiogram revealed normal coronary arteries.  She had a coronary calcium score of 0.  Had an episode of chest tightness.   Occurs after the palpitations. Last for a few minutes. Marland Kitchen   Resolves with deep breathing .  July 10, 2018:  Seen with husband , Kara Owens She has a little bit more energy now that she has reduce the metoprolol from 25 mg 3  times daily to 25 mg twice daily.  She still has palpitations He thinks the palpitations may have worsened slightly since reducing the dose of metoprolol.  Oct. 21, 2019  Kara Owens is back - had MV repiar on sept. 25,  Had PAF - is on amio  December 12, 2018:  Kara Owens is seen today for follow up for MV repair   - with Kara Owens Husband.  Still very sore Walks on weekend   Legs started swelling last week .   Has Several neurologic issues that are not clear.  She is having some dizziness.  She also has had some loss of memory at times.  She also has some hand shaking.  We discussed the fact that she would need to go to see a neurologist if these continued.  Feb. 19, 2020  Overall doing well .  HR has been elevated.  Staying hydrated.   December 03, 2019  Kara Owens is seen today for follow-up visit.  She has a history of mitral valve regurgitation and is status post mitral valve repair.  She is had some palpitations and we started her on Toprol-XL.  She is not sure  if the toprol has helped.  Is stressed at work  Has some DOE  No sleeping well.   Wt is 159 lbs  No cp .   No exercise -  Past Medical History:  Diagnosis Date  . Arrhythmia    post op atrial fibrillation  . Chest pain    Left sided--sharp pain  . Complication of anesthesia   . Fatigue    Extreme  . Mitral regurgitation    Mild  . Mitral valve prolapse   . Palpitations   . PONV (postoperative nausea and vomiting)   . S/P minimally invasive mitral valve repair 09/19/2018   Artificial Gore-tex neochord placement x10 with 32 mm Sorin Memo 4D ring annuloplasty via right mini thoracotomy approach    Past Surgical History:  Procedure Laterality Date  . EYE SURGERY Left    retinal repair   . MITRAL VALVE REPAIR Right 09/19/2018   Procedure: MINIMALLY INVASIVE MITRAL VALVE REPAIR (MVR). 72mm MEMO 4D RING.;  Surgeon: Kara Alberts, MD;  Location: [place];  Service: Open Heart Surgery;  Laterality: Right;  GLUTARALDEHYDE  . TEE  WITHOUT CARDIOVERSION N/A 12/08/2017   Procedure: TRANSESOPHAGEAL ECHOCARDIOGRAM (TEE);  Surgeon: Kara Fredrickson [name], MD;  Location: [place];  Service: Cardiovascular;  Laterality: N/A;  . TEE WITHOUT CARDIOVERSION N/A 07/27/2018   Procedure: TRANSESOPHAGEAL ECHOCARDIOGRAM (TEE);  Surgeon: Kara Fredrickson [name], MD;  Location[facility]COPY;  Service: Cardiovascular;  Laterality: N/A;  . TEE WITHOUT CARDIOVERSION N/A 09/19/2018   Procedure: TRANSESOPHAGEAL ECHOCARDIOGRAM (TEE);  Surgeon: Kara Alberts, MD;  Location: [place];  Service: Open Heart Surgery;  Laterality: N/A;  . TONSILLECTOMY    . TONSILLECTOMY      Current Medications: Current Meds  Medication Sig  . acetaminophen (TYLENOL) 500 MG tablet Take 1,000 mg by mouth every 6 (six) hours as needed (for pain).  Marland Kitchen amoxicillin (AMOXIL) 500 MG tablet Take 2 grams 1 hour prior to dental work  . aspirin EC 81 MG EC tablet Take 1 tablet (81 mg total) by mouth daily.  Marland Kitchen CALCIUM CARBONATE-VITAMIN D PO Take 1 tablet by mouth daily.  . polyethylene glycol (MIRALAX / GLYCOLAX) packet Take 17 g by mouth every other day.   . [DISCONTINUED] metoprolol tartrate (LOPRESSOR) 25 MG tablet Take 12.5 mg by mouth 2 (two) times daily.     Allergies:   Patient has no known allergies.   Social History   Socioeconomic History  . Marital status: Married    Spouse name: Not on file  . Number of children: Not on file  . Years of education: Not on file  . Highest education level: High school graduate  Occupational History  . Not on file  Social Needs  . Financial resource strain: Not hard at all  . Food insecurity    Worry: Never true    Inability: Never true  . Transportation needs    Medical: No    Non-medical: No  Tobacco Use  . Smoking status: Never Smoker  . Smokeless tobacco: Never Used  Substance and Sexual Activity  . Alcohol use: Never    Frequency: Never  . Drug use: Never  . Sexual activity: Not on file  Lifestyle  . Physical  activity    Days per week: 0 days    Minutes per session: 0 min  . Stress: Rather much  Relationships  . Social Herbalist on phone: Not on file    Gets together: Not on file  Attends religious service: Not on file    Active member of club or organization: Not on file    Attends meetings of clubs or organizations: Not on file    Relationship status: Not on file  Other Topics Concern  . Not on file  Social History Narrative  . Not on file     Family History: The patient's family history includes Heart attack in her brother; Heart failure in her mother; Hypertension in her father and mother; Skin cancer in her father. ROS:   Please see the history of present illness.     All other systems reviewed and are negative.  EKGs/Labs/Other Studies Reviewed:    The following studies were reviewed today:    Recent Labs: No results found for requested labs within last 8760 hours.  Recent Lipid Panel    Component Value Date/Time   CHOL 175 09/12/2008 0913   TRIG 40 09/12/2008 0913   HDL 57.8 09/12/2008 0913   CHOLHDL 3.0 CALC 09/12/2008 0913   VLDL 8 09/12/2008 0913   LDLCALC 109 (H) 09/12/2008 0913    Physical Exam: Blood pressure 104/82, pulse 72, height 5\' 8"  (1.727 m), weight 159 lb 6.4 oz (72.3 kg), SpO2 99 %.  GEN:  Well nourished, well developed in no acute distress HEENT: Normal NECK: No JVD; No carotid bruits LYMPHATICS: No lymphadenopathy CARDIAC: RRR , no murmurs, rubs, gallops RESPIRATORY:  Clear to auscultation without rales, wheezing or rhonchi  ABDOMEN: Soft, non-tender, non-distended MUSCULOSKELETAL:  No edema; No deformity  SKIN: Warm and dry NEUROLOGIC:  Alert and oriented x 3   EKG:   December 03, 2019: Normal sinus rhythm at 72 beats a minute.  Rare premature ventricular contraction.  No ST or T wave changes.   ASSESSMENT:    1. H/O mitral valve repair   2. MVP (mitral valve prolapse)   3. Palpitations    PLAN:    1.  Status post  mitral valve repair:   Kara Owens is doing quite well.  She still has some numbness of her thoracostomy sites on her right side.  Her valve sounds great.  There is no signs of residual mitral regurgitation murmur.  2.  Sinus tachycardia:   Seems to be better.   Will hold metoprolol for now . Encourage her to drink Liquid IV or V-8 juice   3. PAF:     Will see her in 3 months    Medication Adjustments/Labs and Tests Ordered: Current medicines are reviewed at length with the patient today.  Concerns regarding medicines are outlined above.  Orders Placed This Encounter  Procedures  . EKG 12-Lead   No orders of the defined types were placed in this encounter.     Signed, Kara Moores, MD  12/03/2019 5:51 PM    [place]

## 2020-06-03 ENCOUNTER — Other Ambulatory Visit: Payer: Self-pay

## 2020-06-03 ENCOUNTER — Ambulatory Visit: Payer: 59 | Admitting: Cardiovascular Disease

## 2020-06-03 ENCOUNTER — Encounter: Payer: Self-pay | Admitting: Cardiovascular Disease

## 2020-06-03 VITALS — BP 106/72 | HR 92 | Ht 67.0 in | Wt 152.5 lb

## 2020-06-03 DIAGNOSIS — I340 Nonrheumatic mitral (valve) insufficiency: Secondary | ICD-10-CM

## 2020-06-03 DIAGNOSIS — I341 Nonrheumatic mitral (valve) prolapse: Secondary | ICD-10-CM | POA: Diagnosis not present

## 2020-06-03 DIAGNOSIS — I480 Paroxysmal atrial fibrillation: Secondary | ICD-10-CM

## 2020-06-03 NOTE — Progress Notes (Signed)
Cardiology Office Note:    Date:  06/03/2020   ID:  Kara Owens, DOB [birth year], MRN [id number]  PCP:  [name], MD  Cardiologist:  [name], MD    Referring MD: [name], MD   Chief Complaint  Patient presents with  . Mitral Valve Prolapse        Kara Owens is a 49 y.o. female with a hx of mitral valve prolapse.  I last saw Kara Owens in 2016.  He has a history of mitral valve prolapse with mild mitral regurgitation.  Mother [name] ) had an ablation last year. Brother had an MI at age 59. ( non smoker, HTN, obese)  Had labs at [name] [facility] office last week.  Had some pleuretic CP last week,    Was given prednisone which helped No cough, cold, no hemoptysis,  No fever, Pain was worse at nihgt.   Trying to exercise  Works in [employer].     Labs from Primary MD   Total chol = 187 Trigs = 70 HDL = 82 LDL = 91   February 23, 2018 Seen with husband  BP was elevated at the TEE  Brought her BP log , all readings were great  Is not exercising regularly   June 08, 2018:  Son is seen back today for follow-up of her mitral valve prolapse.  She has worn an event monitor and was found to have episodes of nonsustained ventricular tachycardia.  We started her on metoprolol 25 mg twice a day which seemed to help with the episodes of nonsustained VT.  We tried increasing the dose to 25 mg 3 times a day but she does  not tolerate this vey well at all due to profound fatigue   She had a stress Myoview study on May 8 which revealed an anterior defect that was thought to be due to breast artifact.    Coronary CT angiogram revealed normal coronary arteries.  She had a coronary calcium score of 0.  Had an episode of chest tightness.   Occurs after the palpitations. Last for a few minutes. Marland Kitchen   Resolves with deep breathing .  July 10, 2018:  Seen with husband , [name] She has a little bit more energy now that she has reduce the metoprolol from 25 mg 3  times daily to 25 mg twice daily.  She still has palpitations He thinks the palpitations may have worsened slightly since reducing the dose of metoprolol.  Oct. 21, 2019  Kara Owens is back - had MV repiar on sept. 25,  Had PAF - is on amio  December 12, 2018:  Kara Owens is seen today for follow up for MV repair   - with [name] Husband.  Still very sore Walks on weekend   Legs started swelling last week .   Has Several neurologic issues that are not clear.  She is having some dizziness.  She also has had some loss of memory at times.  She also has some hand shaking.  We discussed the fact that she would need to go to see a neurologist if these continued.  Feb. 19, 2020  Overall doing well .  HR has been elevated.  Staying hydrated.   December 03, 2019  Kara Owens is seen today for follow-up visit.  She has a history of mitral valve regurgitation and is status post mitral valve repair.  She is had some palpitations and we started her on Toprol-XL.  She is not sure  if the toprol has helped.  Is stressed at work  Has some DOE  No sleeping well.   Wt is 159 lbs  No cp .   No exercise -  June 03, 2020:  Kara Owens is seen today for follow up of her MV repair She has a hx of PAF ( just before and immediately post op )  and sinus tachycardia.  Feels good  Has had 2 episodes  First occurred while walking ,  Felt suddenly tired.   Felt her limbs were tired. .   No slurred speed or drooping of her face.    Lasted less than a min Has been walking and hydrating well  Walks 1.5 miles several times a week .    Past Medical History:  Diagnosis Date  . Arrhythmia    post op atrial fibrillation  . Chest pain    Left sided--sharp pain  . Complication of anesthesia   . Fatigue    Extreme  . Mitral regurgitation    Mild  . Mitral valve prolapse   . Palpitations   . PONV (postoperative nausea and vomiting)   . S/P minimally invasive mitral valve repair 09/19/2018   Artificial Gore-tex neochord  placement x10 with 32 mm Sorin Memo 4D ring annuloplasty via right mini thoracotomy approach    Past Surgical History:  Procedure Laterality Date  . EYE SURGERY Left    retinal repair   . MITRAL VALVE REPAIR Right 09/19/2018   Procedure: MINIMALLY INVASIVE MITRAL VALVE REPAIR (MVR). 62mm MEMO 4D RING.;  Surgeon: [name], MD;  Location: [place];  Service: Open Heart Surgery;  Laterality: Right;  GLUTARALDEHYDE  . TEE WITHOUT CARDIOVERSION N/A 12/08/2017   Procedure: TRANSESOPHAGEAL ECHOCARDIOGRAM (TEE);  Surgeon: [name], MD;  Location: [place];  Service: Cardiovascular;  Laterality: N/A;  . TEE WITHOUT CARDIOVERSION N/A 07/27/2018   Procedure: TRANSESOPHAGEAL ECHOCARDIOGRAM (TEE);  Surgeon: [name], MD;  Location[facility] ENDOSCOPY;  Service: Cardiovascular;  Laterality: N/A;  . TEE WITHOUT CARDIOVERSION N/A 09/19/2018   Procedure: TRANSESOPHAGEAL ECHOCARDIOGRAM (TEE);  Surgeon: [name], MD;  Location: [place];  Service: Open Heart Surgery;  Laterality: N/A;  . TONSILLECTOMY    . TONSILLECTOMY      Current Medications: Current Meds  Medication Sig  . acetaminophen (TYLENOL) 500 MG tablet Take 1,000 mg by mouth every 6 (six) hours as needed (for pain).  Marland Kitchen amoxicillin (AMOXIL) 500 MG tablet Take 2 grams 1 hour prior to dental work  . aspirin EC 81 MG EC tablet Take 1 tablet (81 mg total) by mouth daily.  Marland Kitchen CALCIUM CARBONATE-VITAMIN D PO Take 1 tablet by mouth daily.  . Calcium Citrate-Vitamin D 200-250 MG-UNIT TABS Take 1 tablet by mouth daily.  . Cholecalciferol 25 MCG (1000 UT) capsule Take 1 capsule by mouth daily.  . montelukast (SINGULAIR) 10 MG tablet Take 1 tablet by mouth daily.  . polyethylene glycol (MIRALAX / GLYCOLAX) packet Take 17 g by mouth every other day.      Allergies:   Patient has no known allergies.   Social History   Socioeconomic History  . Marital status: Married    Spouse name: Not on file  . Number of children: Not on file    . Years of education: Not on file  . Highest education level: High school graduate  Occupational History  . Not on file  Tobacco Use  . Smoking status: Never Smoker  . Smokeless tobacco: Never Used  Substance and Sexual Activity  . Alcohol use: Never  . Drug use: Never  . Sexual activity: Not on file  Other Topics Concern  . Not on file  Social History Narrative  . Not on file   Social Determinants of Health   Financial Resource Strain:   . Difficulty of Paying Living Expenses:   Food Insecurity:   . Worried About Charity fundraiser in the Last Year:   . Arboriculturist in the Last Year:   Transportation Needs:   . Film/video editor (Medical):   Marland Kitchen Lack of Transportation (Non-Medical):   Physical Activity:   . Days of Exercise per Week:   . Minutes of Exercise per Session:   Stress:   . Feeling of Stress :   Social Connections:   . Frequency of Communication with Friends and Family:   . Frequency of Social Gatherings with Friends and Family:   . Attends Religious Services:   . Active Member of Clubs or Organizations:   . Attends Archivist Meetings:   Marland Kitchen Marital Status:      Family History: The patient's family history includes Heart attack in her brother; Heart failure in her mother; Hypertension in her father and mother; Skin cancer in her father. ROS:   Please see the history of present illness.     All other systems reviewed and are negative.  EKGs/Labs/Other Studies Reviewed:    The following studies were reviewed today:    Recent Labs: No results found for requested labs within last 8760 hours.  Recent Lipid Panel    Component Value Date/Time   CHOL 175 09/12/2008 0913   TRIG 40 09/12/2008 0913   HDL 57.8 09/12/2008 0913   CHOLHDL 3.0 CALC 09/12/2008 0913   VLDL 8 09/12/2008 0913   LDLCALC 109 (H) 09/12/2008 0913    Physical Exam: Blood pressure 106/72, pulse 92, height 5\' 7"  (1.702 m), weight 152 lb 8 oz (69.2 kg), SpO2 98  %.  GEN:  Well nourished, well developed in no acute distress HEENT: Normal NECK: No JVD; No carotid bruits LYMPHATICS: No lymphadenopathy CARDIAC: RRR  ,  Soft systolic murmur  RESPIRATORY:  Clear to auscultation without rales, wheezing or rhonchi  ABDOMEN: Soft, non-tender, non-distended MUSCULOSKELETAL:  No edema; No deformity  SKIN: Warm and dry NEUROLOGIC:  Alert and oriented x 3   EKG:       ASSESSMENT:    No diagnosis found. PLAN:    1.  Status post mitral valve repair: Valve sounds great.  I do not think there is been any issues with her mitral valve repair.  She has a very soft murmur which is expected.  She had an episode of sudden weakness.  This lasted for perhaps 30 seconds.  I suspect it may be in an arrhythmia or orthostatic hypotension. She had blood drawn yesterday.  She will get the results from Dr. Dema Severin and I have encouraged her to have Dr. Dema Severin fax [place] a copy of those.  If her potassium is low I encouraged her to increase her potassium  intake.   2.  Sinus tachycardia:      3. PAF: No signs or symptoms of recurrent atrial fibrillation.  I will see her again in 6 months.        Medication Adjustments/Labs and Tests Ordered: Current medicines are reviewed at length with the patient today.  Concerns regarding medicines are outlined above.  No orders of the defined types were  placed in this encounter.  No orders of the defined types were placed in this encounter.     Signed, [name], MD  06/03/2020 5:15 PM    [facility]

## 2020-06-03 NOTE — Patient Instructions (Signed)
Medication Instructions:  Your physician recommends that you continue on your current medications as directed. Please refer to the Current Medication list given to you today.  *If you need a refill on your cardiac medications before your next appointment, please call your pharmacy*   Lab Work: None If you have labs (blood work) drawn today and your tests are completely normal, you will receive your results only by: Marland Kitchen MyChart Message (if you have MyChart) OR . A paper copy in the mail If you have any lab test that is abnormal or we need to change your treatment, we will call you to review the results.   Testing/Procedures: None   Follow-Up: At [facility], you and your health needs are our priority.  As part of our continuing mission to provide you with exceptional heart care, we have created designated Provider Care Teams.  These Care Teams include your primary Cardiologist (physician) and Advanced Practice Providers (APPs -  Physician Assistants and Nurse Practitioners) who all work together to provide you with the care you need, when you need it.  We recommend signing up for the patient portal called "MyChart".  Sign up information is provided on this After Visit Summary.  MyChart is used to connect with patients for Virtual Visits (Telemedicine).  Patients are able to view lab/test results, encounter notes, upcoming appointments, etc.  Non-urgent messages can be sent to your provider as well.   To learn more about what you can do with MyChart, go to [email or web address].    Your next appointment:   6 month(s)  The format for your next appointment:   In Person  Provider:   You may see [name], MD or one of the following Advanced Practice Providers on your designated Care Team:    Richardson Dopp, PA-C  Robbie Lis, [place]    Other Instructions

## 2020-07-17 ENCOUNTER — Ambulatory Visit: Payer: 59 | Attending: Critical Care Medicine

## 2020-07-17 DIAGNOSIS — Z23 Encounter for immunization: Secondary | ICD-10-CM

## 2020-07-17 MED ORDER — METOPROLOL SUCCINATE ER 25 MG PO TB24: 12.5000 mg | ORAL_TABLET | Freq: Every day | ORAL | 2 refills | Status: DC

## 2020-07-17 NOTE — Progress Notes (Signed)
   Covid-19 Vaccination Clinic  Name:  Kara Owens    MRN: [id number] DOB: [birth year]  07/17/2020  Kara Owens was observed post Covid-19 immunization for 15 minutes without incident. She was provided with Vaccine Information Sheet and instruction to access the V-Safe system.   Kara Owens was instructed to call 911 with any severe reactions post vaccine: Marland Kitchen Difficulty breathing  . Swelling of face and throat  . A fast heartbeat  . A bad rash all over body  . Dizziness and weakness   Immunizations Administered    Name Date Dose VIS Date Route   Moderna COVID-19 Vaccine 07/17/2020  1:18 PM 0.5 mL 11/2019 Intramuscular   Manufacturer: Moderna   Lot: [id number]   [place]: [id number]

## 2020-08-03 ENCOUNTER — Telehealth: Payer: Self-pay | Admitting: Nurse Practitioner

## 2020-08-03 MED ORDER — PROPRANOLOL HCL 10 MG PO TABS: 10.0000 mg | ORAL_TABLET | Freq: Four times a day (QID) | ORAL | 11 refills | Status: DC | PRN

## 2020-08-03 NOTE — Telephone Encounter (Signed)
Called patient in response to her MyChart message. I explained the use of propranolol prn for pvcs and answered her questions. I advised her to call back if symptoms worsen or persist or if she has other concerns. She verbalized understanding and agreement and thanked me for the call.

## 2020-08-14 ENCOUNTER — Ambulatory Visit: Payer: 59

## 2020-08-18 ENCOUNTER — Ambulatory Visit: Payer: 59

## 2020-11-26 ENCOUNTER — Encounter: Payer: Self-pay | Admitting: Cardiovascular Disease

## 2020-11-26 NOTE — Progress Notes (Signed)
Cardiology Office Note:    Date:  11/27/2020   ID:  Kara Owens, DOB Dec 25, 1971, MRN [id number]  PCP:  [name], MD  Cardiologist:  [name], MD    Referring MD: [name], MD   Chief Complaint  Patient presents with  . Mitral Valve Prolapse        Kara Owens is a 49 y.o. female with a hx of mitral valve prolapse.  I last saw Kara Owens in 2016.  He has a history of mitral valve prolapse with mild mitral regurgitation.  Mother Kara Owens ) had an ablation last year. Brother had an MI at age 100. ( non smoker, HTN, obese)  Had labs at [name] [facility] office last week.  Had some pleuretic CP last week,    Was given prednisone which helped No cough, cold, no hemoptysis,  No fever, Pain was worse at nihgt.   Trying to exercise  Works in [employer].     Labs from Primary MD   Total chol = 187 Trigs = 70 HDL = 82 LDL = 91   February 23, 2018 Seen with husband  BP was elevated at the TEE  Brought her BP log , all readings were great  Is not exercising regularly   June 08, 2018:  Son is seen back today for follow-up of her mitral valve prolapse.  She has worn an event monitor and was found to have episodes of nonsustained ventricular tachycardia.  We started her on metoprolol 25 mg twice a day which seemed to help with the episodes of nonsustained VT.  We tried increasing the dose to 25 mg 3 times a day but she does  not tolerate this vey well at all due to profound fatigue   She had a stress Myoview study on May 8 which revealed an anterior defect that was thought to be due to breast artifact.    Coronary CT angiogram revealed normal coronary arteries.  She had a coronary calcium score of 0.  Had an episode of chest tightness.   Occurs after the palpitations. Last for a few minutes. Marland Kitchen   Resolves with deep breathing .  July 10, 2018:  Seen with husband , Kara Owens She has a little bit more energy now that she has reduce the metoprolol from 25 mg 3  times daily to 25 mg twice daily.  She still has palpitations He thinks the palpitations may have worsened slightly since reducing the dose of metoprolol.  Oct. 21, 2019  Kara Owens is back - had MV repiar on sept. 25,  Had PAF - is on amio  December 12, 2018:  Kara Owens is seen today for follow up for MV repair   - with Kara Owens Husband.  Still very sore Walks on weekend   Legs started swelling last week .   Has Several neurologic issues that are not clear.  She is having some dizziness.  She also has had some loss of memory at times.  She also has some hand shaking.  We discussed the fact that she would need to go to see a neurologist if these continued.  Feb. 19, 2020  Overall doing well .  HR has been elevated.  Staying hydrated.   December 03, 2019  Kara Owens is seen today for follow-up visit.  She has a history of mitral valve regurgitation and is status post mitral valve repair.  She is had some palpitations and we started her on Toprol-XL.  She is not sure  if the toprol has helped.  Is stressed at work  Has some DOE  No sleeping well.   Wt is 159 lbs  No cp .   No exercise -  June 03, 2020:  Kara Owens is seen today for follow up of her MV repair She has a hx of PAF ( just before and immediately post op )  and sinus tachycardia.  Feels good  Has had 2 episodes  First occurred while walking ,  Felt suddenly tired.   Felt her limbs were tired. .   No slurred speed or drooping of her face.    Lasted less than a min Has been walking and hydrating well  Walks 1.5 miles several times a week .   Dec. 3, 2021: Kara Owens is seen for follow up of her mitral valve prolapse / s/p repair. Has had some PAF Had some episodes of weakness, sudden fatigue, no stroke like symptoms  Exam has been stable  Has some palpitations.  Her apple watch recorded a 9 beat run and an 11 beat run of atrial tach vs. Atrial fib.  She has stopped her metoprolol because it made her feel bad Still takes propranolol  rarely .  She avoids caffeine.   She eats well, exercises regularly   Past Medical History:  Diagnosis Date  . Arrhythmia    post op atrial fibrillation  . Chest pain    Left sided--sharp pain  . Complication of anesthesia   . Fatigue    Extreme  . Mitral regurgitation    Mild  . Mitral valve prolapse   . Palpitations   . PONV (postoperative nausea and vomiting)   . S/P minimally invasive mitral valve repair 09/19/2018   Artificial Gore-tex neochord placement x10 with 32 mm Sorin Memo 4D ring annuloplasty via right mini thoracotomy approach    Past Surgical History:  Procedure Laterality Date  . EYE SURGERY Left    retinal repair   . MITRAL VALVE REPAIR Right 09/19/2018   Procedure: MINIMALLY INVASIVE MITRAL VALVE REPAIR (MVR). 70mm MEMO 4D RING.;  Surgeon: [name], MD;  Location: [place];  Service: Open Heart Surgery;  Laterality: Right;  GLUTARALDEHYDE  . TEE WITHOUT CARDIOVERSION N/A 12/08/2017   Procedure: TRANSESOPHAGEAL ECHOCARDIOGRAM (TEE);  Surgeon: [name], MD;  Location: [place];  Service: Cardiovascular;  Laterality: N/A;  . TEE WITHOUT CARDIOVERSION N/A 07/27/2018   Procedure: TRANSESOPHAGEAL ECHOCARDIOGRAM (TEE);  Surgeon: [name], MD;  [facility] END[facility]COPY;  Service: Cardiovascular;  Laterality: N/A;  . TEE WITHOUT CARDIOVERSION N/A 09/19/2018   Procedure: TRANSESOPHAGEAL ECHOCARDIOGRAM (TEE);  Surgeon: [name], MD;  Location: [place];  Service: Open Heart Surgery;  Laterality: N/A;  . TONSILLECTOMY    . TONSILLECTOMY      Current Medications: Current Meds  Medication Sig  . acetaminophen (TYLENOL) 500 MG tablet Take 1,000 mg by mouth every 6 (six) hours as needed (for pain).  Marland Kitchen amoxicillin (AMOXIL) 500 MG tablet Take 2 grams 1 hour prior to dental work  . aspirin EC 81 MG EC tablet Take 1 tablet (81 mg total) by mouth daily.  . Calcium Citrate-Vitamin D 200-250 MG-UNIT TABS Take 1 tablet by mouth daily.  . Cholecalciferol  25 MCG (1000 UT) capsule Take 1 capsule by mouth daily.  . montelukast (SINGULAIR) 10 MG tablet Take 1 tablet by mouth daily.  . polyethylene glycol (MIRALAX / GLYCOLAX) packet Take 17 g by mouth every other day.   . propranolol (INDERAL)  10 MG tablet Take 1 tablet (10 mg total) by mouth 4 (four) times daily as needed (palpitations).     Allergies:   Patient has no known allergies.   Social History   Socioeconomic History  . Marital status: Married    Spouse name: Not on file  . Number of children: Not on file  . Years of education: Not on file  . Highest education level: High school graduate  Occupational History  . Not on file  Tobacco Use  . Smoking status: Never Smoker  . Smokeless tobacco: Never Used  Vaping Use  . Vaping Use: Never used  Substance and Sexual Activity  . Alcohol use: Never  . Drug use: Never  . Sexual activity: Not on file  Other Topics Concern  . Not on file  Social History Narrative  . Not on file   Social Determinants of Health   Financial Resource Strain:   . Difficulty of Paying Living Expenses: Not on file  Food Insecurity:   . Worried About Charity fundraiser in the Last Year: Not on file  . Ran Out of Food in the Last Year: Not on file  Transportation Needs:   . Lack of Transportation (Medical): Not on file  . Lack of Transportation (Non-Medical): Not on file  Physical Activity:   . Days of Exercise per Week: Not on file  . Minutes of Exercise per Session: Not on file  Stress:   . Feeling of Stress : Not on file  Social Connections:   . Frequency of Communication with Friends and Family: Not on file  . Frequency of Social Gatherings with Friends and Family: Not on file  . Attends Religious Services: Not on file  . Active Member of Clubs or Organizations: Not on file  . Attends Archivist Meetings: Not on file  . Marital Status: Not on file     Family History: The patient's family history includes Heart attack in her  brother; Heart failure in her mother; Hypertension in her father and mother; Skin cancer in her father. ROS:   Please see the history of present illness.     All other systems reviewed and are negative.  EKGs/Labs/Other Studies Reviewed:    The following studies were reviewed today:    Recent Labs: No results found for requested labs within last 8760 hours.  Recent Lipid Panel    Component Value Date/Time   CHOL 175 09/12/2008 0913   TRIG 40 09/12/2008 0913   HDL 57.8 09/12/2008 0913   CHOLHDL 3.0 CALC 09/12/2008 0913   VLDL 8 09/12/2008 0913   LDLCALC 109 (H) 09/12/2008 0913    Physical Exam: Blood pressure 100/82, pulse 78, height 5' 7.5" (1.715 m), weight 156 lb 3.2 oz (70.9 kg), SpO2 98 %.  GEN:  Well nourished, well developed in no acute distress HEENT: Normal NECK: No JVD; No carotid bruits LYMPHATICS: No lymphadenopathy CARDIAC: RRR, very soft systolic murmur  Surgical incisions from MV repair are well healed.  RESPIRATORY:  Clear to auscultation without rales, wheezing or rhonchi  ABDOMEN: Soft, non-tender, non-distended MUSCULOSKELETAL:  No edema; No deformity  SKIN: Warm and dry NEUROLOGIC:  Alert and oriented x 3   EKG:    NSR at 78.  No ST or T wave changes.    ASSESSMENT:    1. Paroxysmal atrial fibrillation (HCC)    PLAN:    1.  Status post mitral valve repair: doing well    2.  Sinus  tachycardia:    HR is stable   3. PAF: she has very rare episodes of palpitations.  Her apple watch revealed a 9 beat run and 11 beat run of atrial tach vs. Atrial fib BP is too low to add metoprolol and the metoprolol caused her to feel poorly .  Cont. Propranolol as needed.  If she develops more prolonged episodes and they are determined to be afib, we will need to assess her need for DOAC.   I will see her again in 6 months.        Medication Adjustments/Labs and Tests Ordered: Current medicines are reviewed at length with the patient today.  Concerns  regarding medicines are outlined above.  Orders Placed This Encounter  Procedures  . EKG 12-Lead   No orders of the defined types were placed in this encounter.     Signed, [name], MD  11/27/2020 8:31 AM    [place]

## 2020-11-27 ENCOUNTER — Encounter: Payer: Self-pay | Admitting: Cardiovascular Disease

## 2020-11-27 ENCOUNTER — Other Ambulatory Visit: Payer: Self-pay

## 2020-11-27 ENCOUNTER — Ambulatory Visit: Payer: 59 | Admitting: Cardiovascular Disease

## 2020-11-27 VITALS — BP 100/82 | HR 78 | Ht 67.5 in | Wt 156.2 lb

## 2020-11-27 DIAGNOSIS — I480 Paroxysmal atrial fibrillation: Secondary | ICD-10-CM

## 2020-11-27 NOTE — Patient Instructions (Signed)
Medication Instructions:  Your physician recommends that you continue on your current medications as directed. Please refer to the Current Medication list given to you today.  *If you need a refill on your cardiac medications before your next appointment, please call your pharmacy*  Follow-Up: At [facility], you and your health needs are our priority.  As part of our continuing mission to provide you with exceptional heart care, we have created designated Provider Care Teams.  These Care Teams include your primary Cardiologist (physician) and Advanced Practice Providers (APPs -  Physician Assistants and Nurse Practitioners) who all work together to provide you with the care you need, when you need it.  We recommend signing up for the patient portal called "MyChart".  Sign up information is provided on this After Visit Summary.  MyChart is used to connect with patients for Virtual Visits (Telemedicine).  Patients are able to view lab/test results, encounter notes, upcoming appointments, etc.  Non-urgent messages can be sent to your provider as well.   To learn more about what you can do with MyChart, go to [email or web address].    Your next appointment:   1 year(s)  The format for your next appointment:   In Person  Provider:   You may see [name], MD or one of the following Advanced Practice Providers on your designated Care Team:    Richardson Dopp, PA-C  [place], [place]

## 2021-12-07 ENCOUNTER — Encounter: Payer: Self-pay | Admitting: Cardiovascular Disease

## 2021-12-07 NOTE — Progress Notes (Signed)
Cardiology Office Note:    Date:  12/08/2021   ID:  Kara Owens, DOB 06-Aug-1971, MRN [id number]  PCP:  [name], MD  Cardiologist:  [name], MD    Referring MD: [name], MD   Chief Complaint  Patient presents with   Mitral Valve Prolapse        mitral valve repair        Kara Owens is a 50 y.o. female with a hx of mitral valve prolapse.  I last saw Kara Owens in 2016.  He has a history of mitral valve prolapse with mild mitral regurgitation.  Mother Kara Owens ) had an ablation last year. Brother had an MI at age 60. ( non smoker, HTN, obese)  Had labs at [name] [facility] office last week.  Had some pleuretic CP last week,    Was given prednisone which helped No cough, cold, no hemoptysis,  No fever, Pain was worse at nihgt.   Trying to exercise  Works in [employer].     Labs from Primary MD   Total chol = 187 Trigs = 70 HDL = 82 LDL = 91   February 23, 2018 Seen with husband  BP was elevated at the TEE  Brought her BP log , all readings were great  Is not exercising regularly   June 08, 2018:  Son is seen back today for follow-up of her mitral valve prolapse.  She has worn an event monitor and was found to have episodes of nonsustained ventricular tachycardia.  We started her on metoprolol 25 mg twice a day which seemed to help with the episodes of nonsustained VT.  We tried increasing the dose to 25 mg 3 times a day but she does  not tolerate this vey well at all due to profound fatigue   She had a stress Myoview study on May 8 which revealed an anterior defect that was thought to be due to breast artifact.    Coronary CT angiogram revealed normal coronary arteries.  She had a coronary calcium score of 0.  Had an episode of chest tightness.   Occurs after the palpitations. Last for a few minutes. Marland Kitchen   Resolves with deep breathing .  July 10, 2018:  Seen with husband , Kara Owens She has a little bit more energy now that she has reduce  the metoprolol from 25 mg 3 times daily to 25 mg twice daily.  She still has palpitations He thinks the palpitations may have worsened slightly since reducing the dose of metoprolol.  Oct. 21, 2019  Kara Owens is back - had MV repiar on sept. 25,  Had PAF - is on amio  December 12, 2018:  Kara Owens is seen today for follow up for MV repair   - with Kara Owens Husband.  Still very sore Walks on weekend   Legs started swelling last week .   Has Several neurologic issues that are not clear.  She is having some dizziness.  She also has had some loss of memory at times.  She also has some hand shaking.  We discussed the fact that she would need to go to see a neurologist if these continued.  Feb. 19, 2020  Overall doing well .  HR has been elevated.  Staying hydrated.   December 03, 2019  Kara Owens is seen today for follow-up visit.  She has a history of mitral valve regurgitation and is status post mitral valve repair.  She is had some palpitations and  we started her on Toprol-XL.  She is not sure if the toprol has helped.  Is stressed at work  Has some DOE  No sleeping well.   Wt is 159 lbs  No cp .   No exercise -  June 03, 2020:  Kara Owens is seen today for follow up of her MV repair She has a hx of PAF ( just before and immediately post op )  and sinus tachycardia.  Feels good  Has had 2 episodes  First occurred while walking ,  Felt suddenly tired.   Felt her limbs were tired. .   No slurred speed or drooping of her face.    Lasted less than a min Has been walking and hydrating well  Walks 1.5 miles several times a week .   Dec. 3, 2021: Kara Owens is seen for follow up of her mitral valve prolapse / s/p repair. Has had some PAF Had some episodes of weakness, sudden fatigue, no stroke like symptoms  Exam has been stable  Has some palpitations.  Her apple watch recorded a 9 beat run and an 11 beat run of atrial tach vs. Atrial fib.  She has stopped her metoprolol because it made her feel  bad Still takes propranolol rarely .  She avoids caffeine.   She eats well, exercises regularly    Dec. 14, 2022: Kara Owens is seen today for follow up of her MVP / MV repair, Has very rare episodes of PAF - 9-10 beat episodes  No prolonged episodes as recorded on her Apple watch   Is under lots of stress Is in the middle of a root canal.   Has some palpitations - we have caught frequent PVCs  No recent Afib  Working lots of hours, works in benefits in HR  Not much exercise now    Past Medical History:  Diagnosis Date   Arrhythmia    post op atrial fibrillation   Chest pain    Left sided--sharp pain   Complication of anesthesia    Fatigue    Extreme   Mitral regurgitation    Mild   Mitral valve prolapse    Palpitations    PONV (postoperative nausea and vomiting)    S/P minimally invasive mitral valve repair 09/19/2018   Artificial Gore-tex neochord placement x10 with 32 mm Sorin Memo 4D ring annuloplasty via right mini thoracotomy approach    Past Surgical History:  Procedure Laterality Date   EYE SURGERY Left    retinal repair    MITRAL VALVE REPAIR Right 09/19/2018   Procedure: MINIMALLY INVASIVE MITRAL VALVE REPAIR (MVR). 50mm MEMO 4D RING.;  Surgeon: [name], MD;  Location: [place];  Service: Open Heart Surgery;  Laterality: Right;  GLUTARALDEHYDE   TEE WITHOUT CARDIOVERSION N/A 12/08/2017   Procedure: TRANSESOPHAGEAL ECHOCARDIOGRAM (TEE);  Surgeon: [name], MD;  Location: [place];  Service: Cardiovascular;  Laterality: N/A;   TEE WITHOUT CARDIOVERSION N/A 07/27/2018   Procedure: TRANSESOPHAGEAL ECHOCARDIOGRAM (TEE);  Surgeon: [name], MD;  Location: [facility] ENDOSCOPY;  Service: Cardiovascular;  Laterality: N/A;   TEE WITHOUT CARDIOVERSION N/A 09/19/2018   Procedure: TRANSESOPHAGEAL ECHOCARDIOGRAM (TEE);  Surgeon: [name], MD;  Location: [place];  Service: Open Heart Surgery;  Laterality: N/A;   TONSILLECTOMY     TONSILLECTOMY       Current Medications: Current Meds  Medication Sig   acetaminophen (TYLENOL) 500 MG tablet Take 1,000 mg by mouth every 6 (six) hours as needed (for pain).  amoxicillin (AMOXIL) 500 MG tablet Take 2 grams 1 hour prior to dental work   amoxicillin (AMOXIL) 500 MG tablet Take 4 tablets (2,000 mg total) by mouth 1 hour prior to dental work/cleaning.   aspirin EC 81 MG EC tablet Take 1 tablet (81 mg total) by mouth daily.   Calcium Citrate-Vitamin D 200-250 MG-UNIT TABS Take 1 tablet by mouth daily.   Cholecalciferol 25 MCG (1000 UT) capsule Take 1 capsule by mouth daily.   ibuprofen (ADVIL) 600 MG tablet Take by mouth.   Omega 3 1000 MG CAPS Take 1 capsule by mouth daily.   polyethylene glycol (MIRALAX / GLYCOLAX) packet Take 17 g by mouth every other day.      Allergies:   Patient has no known allergies.   Social History   Socioeconomic History   Marital status: Married    Spouse name: Not on file   Number of children: Not on file   Years of education: Not on file   Highest education level: High school graduate  Occupational History   Not on file  Tobacco Use   Smoking status: Never   Smokeless tobacco: Never  Vaping Use   Vaping Use: Never used  Substance and Sexual Activity   Alcohol use: Never   Drug use: Never   Sexual activity: Not on file  Other Topics Concern   Not on file  Social History Narrative   Not on file   Social Determinants of Health   Financial Resource Strain: Not on file  Food Insecurity: Not on file  Transportation Needs: Not on file  Physical Activity: Not on file  Stress: Not on file  Social Connections: Not on file     Family History: The patient's family history includes Heart attack in her brother; Heart failure in her mother; Hypertension in her father and mother; Skin cancer in her father. ROS:   Please see the history of present illness.     All other systems reviewed and are negative.  EKGs/Labs/Other Studies Reviewed:    The  following studies were reviewed today:    Recent Labs: No results found for requested labs within last 8760 hours.  Recent Lipid Panel    Component Value Date/Time   CHOL 175 09/12/2008 0913   TRIG 40 09/12/2008 0913   HDL 57.8 09/12/2008 0913   CHOLHDL 3.0 CALC 09/12/2008 0913   VLDL 8 09/12/2008 0913   LDLCALC 109 (H) 09/12/2008 0913     Physical Exam: Blood pressure 124/78, pulse 88, height 5\' 8"  (1.727 m), weight 147 lb 3.2 oz (66.8 kg), SpO2 98 %.  GEN:  Well nourished, well developed in no acute distress HEENT: Normal NECK: No JVD; No carotid bruits LYMPHATICS: No lymphadenopathy CARDIAC: RRR , no murmurs, rubs, gallops RESPIRATORY:  Clear to auscultation without rales, wheezing or rhonchi  ABDOMEN: Soft, non-tender, non-distended MUSCULOSKELETAL:  No edema; No deformity  SKIN: Warm and dry NEUROLOGIC:  Alert and oriented x 3   EKG:    December 08, 2021: Normal sinus rhythm at 88.  No ST or T wave changes.    ASSESSMENT:    1. H/O mitral valve repair   2. S/P minimally invasive mitral valve repair     PLAN:    1.  Status post mitral valve repair:  Her valve sounds great.  She is got no signs or symptoms of mitral valve prolapse or mitral regurgitation.  She is in the middle of a complex root canal surgery.  She is  already had surgery yesterday and needs to go back on Thursday.  I have advised her to go ahead and take SBE prophylaxis dose ( 2 grams po just before procedure )   2.  Sinus tachycardia:      3. PAF: she has very rare episodes of palpitations.  Her apple watch revealed a 9 beat run and 11 beat run of atrial tach vs. Atrial fib BP is too low to add metoprolol and the metoprolol caused her to feel poorly .         Medication Adjustments/Labs and Tests Ordered: Current medicines are reviewed at length with the patient today.  Concerns regarding medicines are outlined above.  Orders Placed This Encounter  Procedures   EKG 12-Lead     Meds ordered this encounter  Medications   amoxicillin (AMOXIL) 500 MG tablet    Sig: Take 4 tablets (2,000 mg total) by mouth 1 hour prior to dental work/cleaning.    Dispense:  4 tablet    Refill:  11    SBE prophylaxis       Signed, [name], MD  12/08/2021 6:17 PM    [place]

## 2021-12-08 ENCOUNTER — Other Ambulatory Visit: Payer: Self-pay

## 2021-12-08 ENCOUNTER — Ambulatory Visit: Payer: 59 | Admitting: Cardiovascular Disease

## 2021-12-08 ENCOUNTER — Encounter: Payer: Self-pay | Admitting: Cardiovascular Disease

## 2021-12-08 VITALS — BP 124/78 | HR 88 | Ht 68.0 in | Wt 147.2 lb

## 2021-12-08 DIAGNOSIS — Z9889 Other specified postprocedural states: Secondary | ICD-10-CM

## 2021-12-08 MED ORDER — AMOXICILLIN 500 MG PO TABS: ORAL_TABLET | ORAL | 11 refills | Status: DC

## 2021-12-08 NOTE — Patient Instructions (Signed)
Medication Instructions:   TAKE AMOXICILLIN 500 MG TABLETS--TAKE 4 TABLETS (2,000 MG TOTAL) 1 HOUR PRIOR TO DENTAL WORK/CLEANING  *If you need a refill on your cardiac medications before your next appointment, please call your pharmacy*   Follow-Up: At [facility], you and your health needs are our priority.  As part of our continuing mission to provide you with exceptional heart care, we have created designated Provider Care Teams.  These Care Teams include your primary Cardiologist (physician) and Advanced Practice Providers (APPs -  Physician Assistants and Nurse Practitioners) who all work together to provide you with the care you need, when you need it.  We recommend signing up for the patient portal called "MyChart".  Sign up information is provided on this After Visit Summary.  MyChart is used to connect with patients for Virtual Visits (Telemedicine).  Patients are able to view lab/test results, encounter notes, upcoming appointments, etc.  Non-urgent messages can be sent to your provider as well.   To learn more about what you can do with MyChart, go to [email or web address].    Your next appointment:   1 year(s)  The format for your next appointment:   In Person  Provider:   [name], MD {

## 2022-07-26 ENCOUNTER — Encounter: Payer: Self-pay | Admitting: Cardiovascular Disease

## 2022-07-26 ENCOUNTER — Telehealth: Payer: Self-pay | Admitting: *Deleted

## 2022-07-26 NOTE — Telephone Encounter (Signed)
   Pre-operative Risk Assessment    Patient Name: Kara Owens  DOB: [birth year] MRN: [id number]      Request for Surgical Clearance    Procedure:   LEFT SHOULDER SCOPE & MANIPULATION  Date of Surgery:  Clearance TBD                                 Surgeon:  DR. Ophelia Charter Surgeon's Group or Practice Name:  [facility] Phone number:  [phone] EXT [phone] Fax number:  [phone] ATTN: SHERRI  GAVIN   Type of Clearance Requested:   - Medical ; PT IS ON ASA THOUGH THIS IS NOT INDICATED AS NEEDING TO BE HELD   Type of Anesthesia:  General  WITH INTERSCALENE BLOCK   Additional requests/questions:    Jiles Prows   07/26/2022, 1:13 PM

## 2022-07-27 NOTE — Telephone Encounter (Signed)
   Patient Name: Kara Owens  DOB: 1971/04/29 MRN: [id number]  Primary Cardiologist: [name], MD  Chart reviewed as part of pre-operative protocol coverage. Pre-op clearance already addressed by colleagues in earlier phone notes. To summarize recommendations:  -Kara Owens is at low risk for her upcoming shoulder surgery.  She is on ASA.  There is no indication that she needs to hold ASA but if surgeon request, she may hold ASA for 5 days prior to surgery. -Dr. [name]  Will route this bundled recommendation to requesting provider via Epic fax function and remove from pre-op pool. Please call with questions.  Elgie Collard, PA-C 07/27/2022, 8:17 AM

## 2023-01-11 ENCOUNTER — Encounter: Payer: Self-pay | Admitting: Cardiovascular Disease

## 2023-01-11 NOTE — Progress Notes (Signed)
Cardiology Office Note:    Date:  01/12/2023   ID:  Kara Owens, DOB 25-Jul-1971, MRN [id number]  PCP:  [name], MD  Cardiologist:  [name], MD    Referring MD: [name], MD   Chief Complaint  Patient presents with   mitral valve repair        Kara Owens is a 52 y.o. female with a hx of mitral valve prolapse.  I last saw Kara Owens in 2016.  He has a history of mitral valve prolapse with mild mitral regurgitation.  Mother Kara Owens ) had an ablation last year. Brother had an MI at age 64. ( non smoker, HTN, obese)  Had labs at [name] [facility] office last week.  Had some pleuretic CP last week,    Was given prednisone which helped No cough, cold, no hemoptysis,  No fever, Pain was worse at nihgt.   Trying to exercise  Works in [employer].     Labs from Primary MD   Total chol = 187 Trigs = 70 HDL = 82 LDL = 91   February 23, 2018 Seen with husband  BP was elevated at the TEE  Brought her BP log , all readings were great  Is not exercising regularly   June 08, 2018:  Son is seen back today for follow-up of her mitral valve prolapse.  She has worn an event monitor and was found to have episodes of nonsustained ventricular tachycardia.  We started her on metoprolol 25 mg twice a day which seemed to help with the episodes of nonsustained VT.  We tried increasing the dose to 25 mg 3 times a day but she does  not tolerate this vey well at all due to profound fatigue   She had a stress Myoview study on May 8 which revealed an anterior defect that was thought to be due to breast artifact.    Coronary CT angiogram revealed normal coronary arteries.  She had a coronary calcium score of 0.  Had an episode of chest tightness.   Occurs after the palpitations. Last for a few minutes. Marland Kitchen   Resolves with deep breathing .  July 10, 2018:  Seen with husband , Kara Owens She has a little bit more energy now that she has reduce the metoprolol from 25 mg 3 times  daily to 25 mg twice daily.  She still has palpitations He thinks the palpitations may have worsened slightly since reducing the dose of metoprolol.  Oct. 21, 2019  Kara Owens is back - had MV repiar on sept. 25,  Had PAF - is on amio  December 12, 2018:  Kara Owens is seen today for follow up for MV repair   - with Kara Owens Husband.  Still very sore Walks on weekend   Legs started swelling last week .   Has Several neurologic issues that are not clear.  She is having some dizziness.  She also has had some loss of memory at times.  She also has some hand shaking.  We discussed the fact that she would need to go to see a neurologist if these continued.  Feb. 19, 2020  Overall doing well .  HR has been elevated.  Staying hydrated.   December 03, 2019  [name] is seen today for follow-up visit.  She has a history of mitral valve regurgitation and is status post mitral valve repair.  She is had some palpitations and we started her on Toprol-XL.  She is not sure  if the toprol has helped.  Is stressed at work  Has some DOE  No sleeping well.   Wt is 159 lbs  No cp .   No exercise -  June 03, 2020:  Kara Owens is seen today for follow up of her MV repair She has a hx of PAF ( just before and immediately post op )  and sinus tachycardia.  Feels good  Has had 2 episodes  First occurred while walking ,  Felt suddenly tired.   Felt her limbs were tired. .   No slurred speed or drooping of her face.    Lasted less than a min Has been walking and hydrating well  Walks 1.5 miles several times a week .   Dec. 3, 2021: Kara Owens is seen for follow up of her mitral valve prolapse / s/p repair. Has had some PAF Had some episodes of weakness, sudden fatigue, no stroke like symptoms  Exam has been stable  Has some palpitations.  Her apple watch recorded a 9 beat run and an 11 beat run of atrial tach vs. Atrial fib.  She has stopped her metoprolol because it made her feel bad Still takes propranolol rarely .   She avoids caffeine.   She eats well, exercises regularly    Dec. 14, 2022: Kara Owens is seen today for follow up of her MVP / MV repair, Has very rare episodes of PAF - 9-10 beat episodes  No prolonged episodes as recorded on her Apple watch   Is under lots of stress Is in the middle of a root canal.   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 18, 2024 Kara Owens is seen for follow up of her MVP , s/p MV repair  Has very rare episodes of PAF  Her mother passed away several months ago.     Past Medical History:  Diagnosis Date   Arrhythmia    post op atrial fibrillation   Chest pain    Left sided--sharp pain   Complication of anesthesia    Fatigue    Extreme   Mitral regurgitation    Mild   Mitral valve prolapse    Palpitations    PONV (postoperative nausea and vomiting)    S/P minimally invasive mitral valve repair 09/19/2018   Artificial Gore-tex neochord placement x10 with 32 mm Sorin Memo 4D ring annuloplasty via right mini thoracotomy approach    Past Surgical History:  Procedure Laterality Date   EYE SURGERY Left    retinal repair    MITRAL VALVE REPAIR Right 09/19/2018   Procedure: MINIMALLY INVASIVE MITRAL VALVE REPAIR (MVR). 49m MEMO 4D RING.;  Surgeon: ORexene Alberts MD;  [facility]  Service: Open Heart Surgery;  Laterality: Right;  GLUTARALDEHYDE   TEE WITHOUT CARDIOVERSION N/A 12/08/2017   Procedure: TRANSESOPHAGEAL ECHOCARDIOGRAM (TEE);  Surgeon: NAcie FredricksonPWonda Cheng MD;  [facility]  Service: Cardiovascular;  Laterality: N/A;   TEE WITHOUT CARDIOVERSION N/A 07/27/2018   Procedure: TRANSESOPHAGEAL ECHOCARDIOGRAM (TEE);  Surgeon: NAcie FredricksonPWonda Cheng MD;  [facility];  Service: Cardiovascular;  Laterality: N/A;   TEE WITHOUT CARDIOVERSION N/A 09/19/2018   Procedure: TRANSESOPHAGEAL ECHOCARDIOGRAM (TEE);  Surgeon: ORexene Alberts MD;   [facility]  Service: Open Heart Surgery;  Laterality: N/A;   TONSILLECTOMY     TONSILLECTOMY      Current Medications:  Current Meds  Medication Sig   acetaminophen (TYLENOL) 500 MG tablet Take 1,000 mg by mouth every 6 (six) hours as needed (for pain).   amoxicillin (AMOXIL) 500 MG tablet Take 2 grams 1 hour prior to dental work   amoxicillin (AMOXIL) 500 MG tablet Take 4 tablets (2,000 mg total) by mouth 1 hour prior to dental work/cleaning.   aspirin EC 81 MG EC tablet Take 1 tablet (81 mg total) by mouth daily.   Calcium Citrate-Vitamin D 200-250 MG-UNIT TABS Take 1 tablet by mouth daily.   Cholecalciferol 25 MCG (1000 UT) capsule Take 1 capsule by mouth daily.   estradiol (ESTRACE) 2 MG tablet Take 2 mg by mouth daily.   ibuprofen (ADVIL) 600 MG tablet Take by mouth.   polyethylene glycol (MIRALAX / GLYCOLAX) packet Take 17 g by mouth every other day.    progesterone (PROMETRIUM) 100 MG capsule Take 100 mg by mouth daily.   [DISCONTINUED] Omega 3 1000 MG CAPS Take 1 capsule by mouth daily.   [DISCONTINUED] propranolol (INDERAL) 10 MG tablet Take 1 tablet (10 mg total) by mouth 4 (four) times daily as needed (palpitations).     Allergies:   Calcium   Social History   Socioeconomic History   Marital status: Married    Spouse name: Not on file   Number of children: Not on file   Years of education: Not on file   Highest education level: High school graduate  Occupational History   Not on file  Tobacco Use   Smoking status: Never   Smokeless tobacco: Never  Vaping Use   Vaping Use: Never used  Substance and Sexual Activity   Alcohol use: Never   Drug use: Never   Sexual activity: Not on file  Other Topics Concern   Not on file  Social History Narrative   Not on file   Social Determinants of Health   Financial Resource Strain: Low Risk  (12/18/2018)   Overall Financial Resource Strain (CARDIA)    Difficulty of Paying Living Expenses: Not hard at all  Food  Insecurity: No Food Insecurity (12/18/2018)   Hunger Vital Sign    Worried About Running Out of Food in the Last Year: Never true    [place] in the Last Year: Never true  Transportation Needs: No Transportation Needs (12/18/2018)   PRAPARE - Hydrologist (Medical): No    Lack of Transportation (Non-Medical): No  Physical Activity: Inactive (12/18/2018)   Exercise Vital Sign    Days of Exercise per Week: 0 days    Minutes of Exercise per Session: 0 min  Stress: Stress Concern Present (12/18/2018)   [place]    Feeling of Stress : Rather much  Social Connections: Not on file     Family History: The patient's family history includes Heart attack in her brother; Heart failure in her mother; Hypertension in her father and mother; Skin cancer in her father. ROS:   Please see the history of present illness.     All other systems reviewed and are negative.  EKGs/Labs/Other Studies Reviewed:    The following studies were reviewed today:    Recent Labs: No results found for requested labs within last 365 days.  Recent Lipid Panel    Component Value Date/Time   CHOL 175 09/12/2008 0913   TRIG 40 09/12/2008 0913   HDL 57.8 09/12/2008 0913   CHOLHDL 3.0 CALC 09/12/2008  0913   VLDL 8 09/12/2008 0913   LDLCALC 109 (H) 09/12/2008 0913     Physical Exam: Blood pressure 120/82, pulse 77, height 5' 7.5" (1.715 m), weight 156 lb 9.6 oz (71 kg), SpO2 99 %.       GEN:  Well nourished, well developed in no acute distress HEENT: Normal NECK: No JVD; No carotid bruits LYMPHATICS: No lymphadenopathy CARDIAC: RRR ,  very soft systolic murmur at upper left sternal border ( likely due to her trivial TR ) ,  MV repair scars are healing nicely  RESPIRATORY:  Clear to auscultation without rales, wheezing or rhonchi  ABDOMEN: Soft, non-tender, non-distended MUSCULOSKELETAL:  No edema; No  deformity  SKIN: Warm and dry NEUROLOGIC:  Alert and oriented x 3    EKG:   January 12, 2023: Normal sinus rhythm at 77.  No ST or T wave changes.   ASSESSMENT:    1. H/O mitral valve repair      PLAN:    1.  Status post mitral valve repair: Her valve sounds great.  There is no clinical evidence of mitral valve prolapse or residual mitral regurgitation.  She does have a soft systolic murmur that I suspect is trivial tricuspid regurgitation.  She does not need a follow-up echo at this point.  I suggest that she probably needs 1 at the 10-year mark which would be 5 years from now.    2.  Sinus tachycardia:      3. PAF: No recurrent episodes of atrial fibrillation       Medication Adjustments/Labs and Tests Ordered: Current medicines are reviewed at length with the patient today.  Concerns regarding medicines are outlined above.  Orders Placed This Encounter  Procedures   EKG 12-Lead    No orders of the defined types were placed in this encounter.      Signed, [name], MD  01/12/2023 6:22 PM    [facility]

## 2023-01-12 ENCOUNTER — Ambulatory Visit: Payer: 59 | Attending: Cardiovascular Disease | Admitting: Cardiovascular Disease

## 2023-01-12 ENCOUNTER — Encounter: Payer: Self-pay | Admitting: Cardiovascular Disease

## 2023-01-12 VITALS — BP 120/82 | HR 77 | Ht 67.5 in | Wt 156.6 lb

## 2023-01-12 DIAGNOSIS — Z9889 Other specified postprocedural states: Secondary | ICD-10-CM

## 2023-01-12 DIAGNOSIS — Z954 Presence of other heart-valve replacement: Secondary | ICD-10-CM | POA: Diagnosis not present

## 2023-01-12 NOTE — Patient Instructions (Signed)
Medication Instructions:  Your physician recommends that you continue on your current medications as directed. Please refer to the Current Medication list given to you today.  *If you need a refill on your cardiac medications before your next appointment, please call your pharmacy*   Lab Work: NONE If you have labs (blood work) drawn today and your tests are completely normal, you will receive your results only by: MyChart Message (if you have MyChart) OR A paper copy in the mail If you have any lab test that is abnormal or we need to change your treatment, we will call you to review the results.   Testing/Procedures: NONE   Follow-Up: At [facility], you and your health needs are our priority.  As part of our continuing mission to provide you with exceptional heart care, we have created designated Provider Care Teams.  These Care Teams include your primary Cardiologist (physician) and Advanced Practice Providers (APPs -  Physician Assistants and Nurse Practitioners) who all work together to provide you with the care you need, when you need it.  We recommend signing up for the patient portal called "MyChart".  Sign up information is provided on this After Visit Summary.  MyChart is used to connect with patients for Virtual Visits (Telemedicine).  Patients are able to view lab/test results, encounter notes, upcoming appointments, etc.  Non-urgent messages can be sent to your provider as well.   To learn more about what you can do with MyChart, go to [email or web address].    Your next appointment:   1 year(s)  Provider:   Philip [name], MD   

## 2024-01-06 NOTE — Progress Notes (Signed)
 Cardiology Office Note:    Date:  01/10/2024   ID:  Kara Owens, DOB Nov 23, 1971, MRN [id number]  PCP:  Victorio Grave, MD  Cardiologist:  Ahmad Alert, MD    Referring MD: Victorio Grave, MD   Chief Complaint  Patient presents with   mitral valve repair        Kara Owens is a 53 y.o. female with a hx of mitral valve prolapse.  I last saw Kara Owens in 2016.  He has a history of mitral valve prolapse with mild mitral regurgitation.  Mother Kara Owens ) had an ablation last year. Brother had an MI at age 34. ( non smoker, HTN, obese)  Had labs at Dr. Adah Acron [facility] office last week.  Had some pleuretic CP last week,    Was given prednisone which helped No cough, cold, no hemoptysis,  No fever, Pain was worse at nihgt.   Trying to exercise  Works in [employer].     Labs from Primary MD   Total chol = 187 Trigs = 70 HDL = 82 LDL = 91   February 23, 2018 Seen with husband  BP was elevated at the TEE  Brought her BP log , all readings were great  Is not exercising regularly   June 08, 2018:  Son is seen back today for follow-up of her mitral valve prolapse.  She has worn an event monitor and was found to have episodes of nonsustained ventricular tachycardia.  We started her on metoprolol  25 mg twice a day which seemed to help with the episodes of nonsustained VT.  We tried increasing the dose to 25 mg 3 times a day but she does  not tolerate this vey well at all due to profound fatigue   She had a stress Myoview  study on May 8 which revealed an anterior defect that was thought to be due to breast artifact.    Coronary CT angiogram revealed normal coronary arteries.  She had a coronary calcium score of 0.  Had an episode of chest tightness.   Occurs after the palpitations. Last for a few minutes. Kara Owens   Resolves with deep breathing .  July 10, 2018:  Seen with husband , Kara Owens She has a little bit more energy now that she has reduce the metoprolol  from 25 mg 3 times  daily to 25 mg twice daily.  She still has palpitations He thinks the palpitations may have worsened slightly since reducing the dose of metoprolol .  Oct. 21, 2019  Humayra is back - had MV repiar on sept. 25,  Had PAF - is on amio  December 12, 2018:  Kara Owens is seen today for follow up for MV repair   - with Kara Owens Husband.  Still very sore Walks on weekend   Legs started swelling last week .   Has Several neurologic issues that are not clear.  She is having some dizziness.  She also has had some loss of memory at times.  She also has some hand shaking.  We discussed the fact that she would need to go to see a neurologist if these continued.  Feb. 19, 2020  Overall doing well .  HR has been elevated.  Staying hydrated.   December 03, 2019  Kara Owens is seen today for follow-up visit.  She has a history of mitral valve regurgitation and is status post mitral valve repair.  She is had some palpitations and we started her on Toprol -XL.  She is not sure  if the toprol  has helped.  Is stressed at work  Has some DOE  No sleeping well.   Wt is 159 lbs  No cp .   No exercise -  June 03, 2020:  Kara Owens is seen today for follow up of her MV repair She has a hx of PAF ( just before and immediately post op )  and sinus tachycardia.  Feels good  Has had 2 episodes  First occurred while walking ,  Felt suddenly tired.   Felt her limbs were tired. .   No slurred speed or drooping of her face.    Lasted less than a min Has been walking and hydrating well  Walks 1.5 miles several times a week .   Dec. 3, 2021: Kara Owens is seen for follow up of her mitral valve prolapse / s/p repair. Has had some PAF Had some episodes of weakness, sudden fatigue, no stroke like symptoms  Exam has been stable  Has some palpitations.  Her apple watch recorded a 9 beat run and an 11 beat run of atrial tach vs. Atrial fib.  She has stopped her metoprolol  because it made her feel bad Still takes propranolol  rarely .   She avoids caffeine.   She eats well, exercises regularly    Dec. 14, 2022: Kara Owens is seen today for follow up of her MVP / MV repair, Has very rare episodes of PAF - 9-10 beat episodes  No prolonged episodes as recorded on her Apple watch   Is under lots of stress Is in the middle of a root canal.   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 18, 2024 Kara Owens is seen for follow up of her MVP , s/p MV repair  Has very rare episodes of PAF  Her mother passed away several months ago.    Jan. 15, 2025 Kara Owens is sen for follow up of her MVP , s/p MV repair.remote hx of atrial fib ( prior to her MV repair)   Exercising .   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      Past Medical History:  Diagnosis Date   Arrhythmia    post op atrial fibrillation   Chest pain    Left sided--sharp pain   Complication of anesthesia    Fatigue    Extreme   Mitral regurgitation    Mild   Mitral valve prolapse    Palpitations    PONV (postoperative nausea and vomiting)    S/P minimally invasive mitral valve repair 09/19/2018   Artificial Gore-tex neochord placement x10 with 32 mm Sorin Memo 4D ring annuloplasty via right mini thoracotomy approach    Past Surgical History:  Procedure Laterality Date   EYE SURGERY Left    retinal repair    MITRAL VALVE REPAIR Right 09/19/2018   Procedure: MINIMALLY INVASIVE MITRAL VALVE REPAIR (MVR). 32mm MEMO 4D RING.;  Surgeon: Gardenia Jump, MD;  Location[facility];  Service: Open Heart Surgery;  Laterality: Right;  GLUTARALDEHYDE   TEE WITHOUT CARDIOVERSION N/A 12/08/2017   Procedure: TRANSESOPHAGEAL ECHOCARDIOGRAM (TEE);  Surgeon: Alroy Aspen Lela Purple, MD;  Location: [facility] ENDOSCOPY;  Service: Cardiovascular;  Laterality: N/A;   TEE WITHOUT CARDIOVERSION N/A 07/27/2018   Procedure: TRANSESOPHAGEAL ECHOCARDIOGRAM (TEE);  Surgeon: Alroy Aspen Lela Purple, MD;  Location: [facility]  ENDOSCOPY;  Service: Cardiovascular;  Laterality: N/A;   TEE  WITHOUT CARDIOVERSION N/A 09/19/2018   Procedure: TRANSESOPHAGEAL ECHOCARDIOGRAM (TEE);  Surgeon: Gardenia Jump, MD;  Location: [facility] OR;  Service: Open Heart Surgery;  Laterality: N/A;   TONSILLECTOMY     TONSILLECTOMY      Current Medications: Current Meds  Medication Sig   acetaminophen  (TYLENOL ) 500 MG tablet Take 1,000 mg by mouth every 6 (six) hours as needed (for pain).   aspirin  EC 81 MG EC tablet Take 1 tablet (81 mg total) by mouth daily.   Calcium Citrate-Vitamin D 200-250 MG-UNIT TABS Take 1 tablet by mouth daily.   estradiol (ESTRACE) 2 MG tablet Take 2 mg by mouth daily.   ibuprofen (ADVIL) 600 MG tablet Take by mouth.   polyethylene glycol (MIRALAX  / GLYCOLAX ) packet Take 17 g by mouth every other day.    progesterone (PROMETRIUM) 100 MG capsule Take 100 mg by mouth daily.   [DISCONTINUED] amoxicillin  (AMOXIL ) 500 MG tablet Take 4 tablets (2,000 mg total) by mouth 1 hour prior to dental work/cleaning.     Allergies:   Calcium   Social History   Socioeconomic History   Marital status: Married    Spouse name: Not on file   Number of children: Not on file   Years of education: Not on file   Highest education level: High school graduate  Occupational History   Not on file  Tobacco Use   Smoking status: Never   Smokeless tobacco: Never  Vaping Use   Vaping status: Never Used  Substance and Sexual Activity   Alcohol use: Never   Drug use: Never   Sexual activity: Not on file  Other Topics Concern   Not on file  Social History Narrative   Not on file   Social Drivers of Health   Financial Resource Strain: Low Risk  (12/18/2018)   Overall Financial Resource Strain (CARDIA)    Difficulty of Paying Living Expenses: Not hard at all  Food Insecurity: No Food Insecurity (12/18/2018)   Hunger Vital Sign    Worried About Running Out of Food in the Last Year: Never true    Ran Out of Food in the Last  Year: Never true  Transportation Needs: No Transportation Needs (12/18/2018)   PRAPARE - Administrator, Civil Service (Medical): No    Lack of Transportation (Non-Medical): No  Physical Activity: Inactive (12/18/2018)   Exercise Vital Sign    Days of Exercise per Week: 0 days    Minutes of Exercise per Session: 0 min  Stress: Stress Concern Present (12/18/2018)   [employer] of Occupational Health - Occupational Stress Questionnaire    Feeling of Stress : Rather much  Social Connections: Not on file     Family History: The patient's family history includes Heart attack in her brother; Heart failure in her mother; Hypertension in her father and mother; Skin cancer in her father. ROS:   Please see the history of present illness.     All other systems reviewed and are negative.  EKGs/Labs/Other Studies Reviewed:    The following studies were reviewed today:    Recent Labs: No results found for requested labs within last 365 days.  Recent Lipid Panel    Component Value Date/Time   CHOL 175 09/12/2008 0913   TRIG 40 09/12/2008 0913   HDL 57.8 09/12/2008 0913   CHOLHDL 3.0 CALC 09/12/2008 0913   VLDL 8 09/12/2008 0913   LDLCALC 109 (H) 09/12/2008 0913      Physical Exam: Blood pressure  114/88, pulse 82, height 5' 7.5" (1.715 m), weight 155 lb 9.6 oz (70.6 kg), SpO2 97%.       GEN:  Well nourished, well developed in no acute distress HEENT: Normal NECK: No JVD; No carotid bruits LYMPHATICS: No lymphadenopathy CARDIAC: RRR  RESPIRATORY:  Clear to auscultation without rales, wheezing or rhonchi  ABDOMEN: Soft, non-tender, non-distended MUSCULOSKELETAL:  No edema; No deformity  SKIN: Warm and dry NEUROLOGIC:  Alert and oriented x 3   EKG:  EKG Interpretation Date/Time:  Wednesday January 10 2024 15:03:35 EST Ventricular Rate:  82 PR Interval:  126 QRS Duration:  78 QT Interval:  386 QTC Calculation: 450 R Axis:   82  Text  Interpretation: Normal sinus rhythm Normal ECG When compared with ECG of 23-Sep-2018 07:14, Sinus rhythm has replaced Atrial fibrillation Vent. rate has decreased BY  65 BPM Nonspecific T wave abnormality, improved in Inferior leads Confirmed by Ahmad Alert ([id number]) on 01/10/2024 3:23:30 PM      ASSESSMENT:    1. Mitral valve prolapse   2. H/O mitral valve repair   3. S/P minimally invasive mitral valve repair       PLAN:    1.  Status post mitral valve repair: She is doing very well following her mitral valve prolapse.  Her cardiac exam is normal.  I did not hear any murmurs today.  She takes SBE abx prior to dental visits         3. PAF: No recurrent episodes of atrial fibrillation She has an apple watch and pays attention to her heart rhythm         Medication Adjustments/Labs and Tests Ordered: Current medicines are reviewed at length with the patient today.  Concerns regarding medicines are outlined above.  Orders Placed This Encounter  Procedures   EKG 12-Lead    Meds ordered this encounter  Medications   amoxicillin  (AMOXIL ) 500 MG tablet    Sig: Take 4 tablets (2,000 mg total) by mouth 1 hour prior to dental work/cleaning.    Dispense:  4 tablet    Refill:  3    SBE prophylaxis       Signed, Ahmad Alert, MD  01/10/2024 4:04 PM    [facility]

## 2024-01-10 ENCOUNTER — Encounter: Payer: Self-pay | Admitting: Cardiovascular Disease

## 2024-01-10 ENCOUNTER — Ambulatory Visit: Payer: 59 | Attending: Cardiovascular Disease | Admitting: Cardiovascular Disease

## 2024-01-10 VITALS — BP 114/88 | HR 82 | Ht 67.5 in | Wt 155.6 lb

## 2024-01-10 DIAGNOSIS — Z9889 Other specified postprocedural states: Secondary | ICD-10-CM

## 2024-01-10 DIAGNOSIS — I341 Nonrheumatic mitral (valve) prolapse: Secondary | ICD-10-CM

## 2024-01-10 MED ORDER — AMOXICILLIN 500 MG PO TABS: ORAL_TABLET | ORAL | 3 refills | Status: AC

## 2024-01-10 NOTE — Patient Instructions (Signed)
 Follow-Up: At [facility], you and your health needs are our priority.  As part of our continuing mission to provide you with exceptional heart care, we have created designated Provider Care Teams.  These Care Teams include your primary Cardiologist (physician) and Advanced Practice Providers (APPs -  Physician Assistants and Nurse Practitioners) who all work together to provide you with the care you need, when you need it.  We recommend signing up for the patient portal called "MyChart".  Sign up information is provided on this After Visit Summary.  MyChart is used to connect with patients for Virtual Visits (Telemedicine).  Patients are able to view lab/test results, encounter notes, upcoming appointments, etc.  Non-urgent messages can be sent to your provider as well.   To learn more about what you can do with MyChart, go to [email or web address].    Your next appointment:   1 year(s)  Provider:   Tessa Lerner, MD     1st Floor: - Lobby - Registration  - Pharmacy  - Lab - Cafe  2nd Floor: - PV Lab - Diagnostic Testing (echo, CT, nuclear med)  3rd Floor: - Vacant  4th Floor: - TCTS (cardiothoracic surgery) - AFib Clinic - Structural Heart Clinic - Vascular Surgery  - Vascular Ultrasound  5th Floor: - [facility] (general and EP) - Clinical Pharmacy for coumadin, hypertension, lipid, weight-loss medications, and med management appointments    Valet parking services will be available as well.

## 2024-03-09 ENCOUNTER — Emergency Department (HOSPITAL_BASED_OUTPATIENT_CLINIC_OR_DEPARTMENT_OTHER)

## 2024-03-09 ENCOUNTER — Encounter (HOSPITAL_BASED_OUTPATIENT_CLINIC_OR_DEPARTMENT_OTHER): Payer: Self-pay | Admitting: Emergency Medicine

## 2024-03-09 ENCOUNTER — Observation Stay (HOSPITAL_BASED_OUTPATIENT_CLINIC_OR_DEPARTMENT_OTHER)
Admission: EM | Admit: 2024-03-09 | Discharge: 2024-03-11 | Disposition: A | Discharge status: Home / Self Care | Attending: Internal Medicine | Admitting: Internal Medicine

## 2024-03-09 DIAGNOSIS — R1011 Right upper quadrant pain: Secondary | ICD-10-CM | POA: Diagnosis present

## 2024-03-09 DIAGNOSIS — R112 Nausea with vomiting, unspecified: Secondary | ICD-10-CM | POA: Diagnosis not present

## 2024-03-09 DIAGNOSIS — Z79899 Other long term (current) drug therapy: Secondary | ICD-10-CM | POA: Insufficient documentation

## 2024-03-09 DIAGNOSIS — E876 Hypokalemia: Secondary | ICD-10-CM | POA: Diagnosis not present

## 2024-03-09 DIAGNOSIS — Z7982 Long term (current) use of aspirin: Secondary | ICD-10-CM | POA: Insufficient documentation

## 2024-03-09 DIAGNOSIS — Z1152 Encounter for screening for COVID-19: Secondary | ICD-10-CM | POA: Insufficient documentation

## 2024-03-09 DIAGNOSIS — K8020 Calculus of gallbladder without cholecystitis without obstruction: Secondary | ICD-10-CM | POA: Diagnosis not present

## 2024-03-09 DIAGNOSIS — N3000 Acute cystitis without hematuria: Secondary | ICD-10-CM | POA: Insufficient documentation

## 2024-03-09 DIAGNOSIS — K8012 Calculus of gallbladder with acute and chronic cholecystitis without obstruction: Secondary | ICD-10-CM | POA: Diagnosis not present

## 2024-03-09 DIAGNOSIS — R109 Unspecified abdominal pain: Secondary | ICD-10-CM | POA: Diagnosis present

## 2024-03-09 DIAGNOSIS — E872 Acidosis, unspecified: Secondary | ICD-10-CM | POA: Diagnosis not present

## 2024-03-09 DIAGNOSIS — Z9889 Other specified postprocedural states: Secondary | ICD-10-CM

## 2024-03-09 DIAGNOSIS — Z79890 Hormone replacement therapy (postmenopausal): Secondary | ICD-10-CM

## 2024-03-09 DIAGNOSIS — I480 Paroxysmal atrial fibrillation: Secondary | ICD-10-CM | POA: Insufficient documentation

## 2024-03-09 DIAGNOSIS — R1110 Vomiting, unspecified: Secondary | ICD-10-CM

## 2024-03-09 LAB — BASIC METABOLIC PANEL
Anion gap: 13 (ref 5–15)
BUN: 20 mg/dL (ref 6–20)
CO2: 19 mmol/L — ABNORMAL LOW (ref 22–32)
Calcium: 9.9 mg/dL (ref 8.9–10.3)
Chloride: 103 mmol/L (ref 98–111)
Creatinine, Ser: 0.76 mg/dL (ref 0.44–1.00)
GFR, Estimated: >60 mL/min (ref >60)
Glucose, Bld: 138 mg/dL — ABNORMAL HIGH (ref 70–99)
Potassium: 3.3 mmol/L — ABNORMAL LOW (ref 3.5–5.1)
Sodium: 135 mmol/L (ref 135–145)

## 2024-03-09 LAB — CBC
HCT: 38.2 % (ref 36.0–46.0)
Hemoglobin: 13.3 g/dL (ref 12.0–15.0)
MCH: 31.3 pg (ref 26.0–34.0)
MCHC: 34.8 g/dL (ref 30.0–36.0)
MCV: 89.9 fL (ref 80.0–100.0)
Platelets: 229 10*3/uL (ref 150–400)
RBC: 4.25 MIL/uL (ref 3.87–5.11)
RDW: 13.5 % (ref 11.5–15.5)
WBC: 13.5 10*3/uL — ABNORMAL HIGH (ref 4.0–10.5)
nRBC: 0 % (ref 0.0–0.2)

## 2024-03-09 LAB — URINALYSIS, ROUTINE W REFLEX MICROSCOPIC
Bilirubin Urine: NEGATIVE
Glucose, UA: NEGATIVE mg/dL
Ketones, ur: 40 mg/dL — AB
Leukocytes,Ua: NEGATIVE
Nitrite: NEGATIVE
Protein, ur: NEGATIVE mg/dL
Specific Gravity, Urine: 1.025 (ref 1.005–1.030)
pH: 8.5 — ABNORMAL HIGH (ref 5.0–8.0)

## 2024-03-09 LAB — RESP PANEL BY RT-PCR (RSV, FLU A&B, COVID)  RVPGX2
Influenza A by PCR: NEGATIVE
Influenza B by PCR: NEGATIVE
Resp Syncytial Virus by PCR: NEGATIVE
SARS Coronavirus 2 by RT PCR: NEGATIVE

## 2024-03-09 LAB — HEPATIC FUNCTION PANEL
ALT: 27 U/L (ref 0–44)
AST: 50 U/L — ABNORMAL HIGH (ref 15–41)
Albumin: 4.1 g/dL (ref 3.5–5.0)
Alkaline Phosphatase: 55 U/L (ref 38–126)
Bilirubin, Direct: 0.3 mg/dL — ABNORMAL HIGH (ref 0.0–0.2)
Indirect Bilirubin: 1 mg/dL — ABNORMAL HIGH (ref 0.3–0.9)
Total Bilirubin: 1.3 mg/dL — ABNORMAL HIGH (ref 0.0–1.2)
Total Protein: 7.2 g/dL (ref 6.5–8.1)

## 2024-03-09 LAB — LIPASE, BLOOD: Lipase: 41 U/L (ref 11–51)

## 2024-03-09 LAB — URINALYSIS, MICROSCOPIC (REFLEX)

## 2024-03-09 MED ORDER — SODIUM CHLORIDE 0.9 % IV SOLN
2.0000 g | Freq: Once | INTRAVENOUS | Status: AC
  Administered 2024-03-09: 2 g via INTRAVENOUS
  Filled 2024-03-09: qty 20

## 2024-03-09 MED ORDER — ONDANSETRON 4 MG PO TBDP
4.0000 mg | ORAL_TABLET | Freq: Once | ORAL | Status: AC
Start: 2024-03-09 — End: 2024-03-09
  Administered 2024-03-09: 4 mg via ORAL
  Filled 2024-03-09: qty 1

## 2024-03-09 MED ORDER — SODIUM CHLORIDE 0.9 % IV SOLN
2.0000 g | INTRAVENOUS | Status: DC
  Administered 2024-03-10: 2 g via INTRAVENOUS
  Filled 2024-03-09: qty 20

## 2024-03-09 MED ORDER — POTASSIUM CHLORIDE 10 MEQ/100ML IV SOLN
10.0000 meq | INTRAVENOUS | Status: AC
  Administered 2024-03-10 (×2): 10 meq via INTRAVENOUS
  Filled 2024-03-09: qty 100

## 2024-03-09 MED ORDER — SODIUM CHLORIDE 0.9 % IV SOLN: 1.0000 g | INTRAVENOUS | Status: DC

## 2024-03-09 MED ORDER — ACETAMINOPHEN 650 MG RE SUPP
650.0000 mg | Freq: Four times a day (QID) | RECTAL | Status: DC | PRN
Start: 2024-03-09 — End: 2024-03-11

## 2024-03-09 MED ORDER — ASPIRIN 81 MG PO TBEC
81.0000 mg | DELAYED_RELEASE_TABLET | Freq: Every day | ORAL | Status: DC
  Administered 2024-03-10: 81 mg via ORAL
  Filled 2024-03-09: qty 1

## 2024-03-09 MED ORDER — MORPHINE SULFATE (PF) 2 MG/ML IV SOLN
1.0000 mg | INTRAVENOUS | Status: DC | PRN
  Filled 2024-03-09: qty 1

## 2024-03-09 MED ORDER — METOCLOPRAMIDE HCL 5 MG/ML IJ SOLN
10.0000 mg | Freq: Once | INTRAMUSCULAR | Status: AC
  Administered 2024-03-09: 10 mg via INTRAVENOUS
  Filled 2024-03-09: qty 2

## 2024-03-09 MED ORDER — ASPIRIN 81 MG PO TBEC: 81.0000 mg | DELAYED_RELEASE_TABLET | Freq: Every day | ORAL | Status: DC

## 2024-03-09 MED ORDER — HYDROMORPHONE HCL 1 MG/ML IJ SOLN
0.5000 mg | Freq: Once | INTRAMUSCULAR | Status: AC
  Administered 2024-03-09: 0.5 mg via INTRAVENOUS
  Filled 2024-03-09: qty 1

## 2024-03-09 MED ORDER — SODIUM CHLORIDE 0.9 % IV SOLN: 250.0000 mL | INTRAVENOUS | Status: AC | PRN

## 2024-03-09 MED ORDER — SODIUM CHLORIDE 0.9 % IV BOLUS
1000.0000 mL | Freq: Once | INTRAVENOUS | Status: AC
  Administered 2024-03-09: 1000 mL via INTRAVENOUS

## 2024-03-09 MED ORDER — KETOROLAC TROMETHAMINE 15 MG/ML IJ SOLN
15.0000 mg | Freq: Once | INTRAMUSCULAR | Status: AC
Start: 2024-03-09 — End: 2024-03-09
  Administered 2024-03-09: 15 mg via INTRAVENOUS
  Filled 2024-03-09: qty 1

## 2024-03-09 MED ORDER — ONDANSETRON HCL 4 MG/2ML IJ SOLN
4.0000 mg | Freq: Once | INTRAMUSCULAR | Status: AC
  Administered 2024-03-09: 4 mg via INTRAVENOUS
  Filled 2024-03-09: qty 2

## 2024-03-09 MED ORDER — SODIUM CHLORIDE 0.9% FLUSH
3.0000 mL | Freq: Two times a day (BID) | INTRAVENOUS | Status: DC
  Administered 2024-03-10: 3 mL via INTRAVENOUS

## 2024-03-09 MED ORDER — SODIUM BICARBONATE 650 MG PO TABS
650.0000 mg | ORAL_TABLET | Freq: Two times a day (BID) | ORAL | Status: AC
  Administered 2024-03-10: 650 mg via ORAL
  Filled 2024-03-09: qty 1

## 2024-03-09 MED ORDER — SODIUM CHLORIDE 0.9 % IV SOLN: INTRAVENOUS | Status: DC

## 2024-03-09 MED ORDER — METRONIDAZOLE 500 MG/100ML IV SOLN
500.0000 mg | Freq: Two times a day (BID) | INTRAVENOUS | Status: DC
Start: 2024-03-09 — End: 2024-03-15
  Administered 2024-03-10 – 2024-03-11 (×3): 500 mg via INTRAVENOUS
  Filled 2024-03-09 (×3): qty 100

## 2024-03-09 MED ORDER — SODIUM CHLORIDE 0.9% FLUSH: 3.0000 mL | INTRAVENOUS | Status: DC | PRN

## 2024-03-09 MED ORDER — ONDANSETRON HCL 4 MG/2ML IJ SOLN
4.0000 mg | Freq: Four times a day (QID) | INTRAMUSCULAR | Status: DC | PRN
  Administered 2024-03-10: 4 mg via INTRAVENOUS
  Filled 2024-03-09: qty 2

## 2024-03-09 MED ORDER — POLYETHYLENE GLYCOL 3350 17 G PO PACK
17.0000 g | PACK | Freq: Every day | ORAL | Status: DC
  Administered 2024-03-10: 17 g via ORAL
  Filled 2024-03-09: qty 1

## 2024-03-09 MED ORDER — SENNOSIDES-DOCUSATE SODIUM 8.6-50 MG PO TABS: 1.0000 | ORAL_TABLET | Freq: Every evening | ORAL | Status: DC | PRN

## 2024-03-09 MED ORDER — ACETAMINOPHEN 325 MG PO TABS
650.0000 mg | ORAL_TABLET | Freq: Four times a day (QID) | ORAL | Status: DC | PRN
  Administered 2024-03-10 – 2024-03-11 (×3): 650 mg via ORAL
  Filled 2024-03-09 (×3): qty 2

## 2024-03-09 MED ORDER — MORPHINE SULFATE (PF) 2 MG/ML IV SOLN: 2.0000 mg | INTRAVENOUS | Status: DC | PRN

## 2024-03-09 NOTE — ED Notes (Signed)
 Lab called to inform insufficient amt of urine for u/a

## 2024-03-09 NOTE — ED Notes (Addendum)
 No handoff sign off from the floor RN, called unit, spoke with unit secretary, states RN Toni Amend) not available, will review the chart and call back or message with any questions

## 2024-03-09 NOTE — ED Triage Notes (Addendum)
 Pt reports flank pain that started 0930 today, chills & n/v; denies cough, fever, hematuria, dysuria, HA, sore throat or other sxs; denies hx of kidney stones

## 2024-03-09 NOTE — ED Notes (Signed)
 [facility] on the floor, report & paperwork given, belongings bagged and sent with pt or husband

## 2024-03-09 NOTE — ED Notes (Signed)
 Pt reports not "feeling great" after eating and drinking, reports slightly increased nausea and flank/back pain, denies vomiting

## 2024-03-09 NOTE — ED Provider Notes (Signed)
 [place] [facility] Provider Note   CSN: [id number] Arrival date & time: 03/09/24  1239     History  Chief Complaint  Patient presents with   Flank Pain    Kara Owens is a 53 y.o. female.  Patient with history of mitral valve repair, on aspirin, no anticoagulation, no past abdominal surgical history presents to the emergency department for evaluation of acute onset of right-sided flank pain with associated nausea and vomiting starting around 9 AM today.  Patient had no preceding symptoms.  No chest pain or shortness of breath.  No dysuria, hematuria, increased frequency or urgency.  No treatments prior to arrival.  Zofran given on arrival.  No history of kidney stones.         Home Medications Prior to Admission medications   Medication Sig Start Date End Date Taking? Authorizing Provider  acetaminophen (TYLENOL) 500 MG tablet Take 1,000 mg by mouth every 6 (six) hours as needed (for pain).    [provider]  amoxicillin (AMOXIL) 500 MG tablet Take 4 tablets (2,000 mg total) by mouth 1 hour prior to dental work/cleaning. 01/10/24   [name], Deloris Ping, MD  aspirin EC 81 MG EC tablet Take 1 tablet (81 mg total) by mouth daily. 09/24/18   Barrett, Erin R, PA-C  Calcium Citrate-Vitamin D 200-250 MG-UNIT TABS Take 1 tablet by mouth daily.    [provider]  estradiol (ESTRACE) 2 MG tablet Take 2 mg by mouth daily. 07/18/22   [provider]  ibuprofen (ADVIL) 600 MG tablet Take by mouth. 12/07/21   [provider]  polyethylene glycol (MIRALAX / GLYCOLAX) packet Take 17 g by mouth every other day.     [provider]  progesterone (PROMETRIUM) 100 MG capsule Take 100 mg by mouth daily. 07/18/22   [provider]      Allergies    Calcium    Review of Systems   Review of Systems  Physical Exam Updated Vital Signs BP 133/84 (BP Location: Left Arm)   Pulse 78   Temp 98 F (36.7 C)   Resp  18   Ht 5\' 8"  (1.727 m)   Wt 72.6 kg   SpO2 100%   BMI 24.33 kg/m   Physical Exam Vitals and nursing note reviewed.  Constitutional:      General: She is in acute distress (Uncomfortable appearing).     Appearance: She is well-developed.  HENT:     Head: Normocephalic and atraumatic.     Right Ear: External ear normal.     Left Ear: External ear normal.     Nose: Nose normal.  Eyes:     Conjunctiva/sclera: Conjunctivae normal.  Cardiovascular:     Rate and Rhythm: Normal rate and regular rhythm.     Heart sounds: No murmur heard. Pulmonary:     Effort: No respiratory distress.     Breath sounds: No wheezing, rhonchi or rales.  Abdominal:     Palpations: Abdomen is soft.     Tenderness: There is abdominal tenderness (Mild, right lateral abdomen). There is no guarding or rebound.  Musculoskeletal:     Cervical back: Normal range of motion and neck supple.     Right lower leg: No edema.     Left lower leg: No edema.  Skin:    General: Skin is warm and dry.     Findings: No rash.  Neurological:     General: No focal deficit present.  Mental Status: She is alert. Mental status is at baseline.     Motor: No weakness.  Psychiatric:        Mood and Affect: Mood normal.     ED Results / Procedures / Treatments   Labs (all labs ordered are listed, but only abnormal results are displayed) Labs Reviewed  BASIC METABOLIC PANEL - Abnormal; Notable for the following components:      Result Value   Potassium 3.3 (*)    CO2 19 (*)    Glucose, Bld 138 (*)    All other components within normal limits  CBC - Abnormal; Notable for the following components:   WBC 13.5 (*)    All other components within normal limits  RESP PANEL BY RT-PCR (RSV, FLU A&B, COVID)  [id number]  URINALYSIS, ROUTINE W REFLEX MICROSCOPIC    EKG None  Radiology No results found.  Procedures Procedures    Medications Ordered in ED Medications  ketorolac (TORADOL) 15 MG/ML injection 15 mg (has  no administration in time range)  ondansetron (ZOFRAN-ODT) disintegrating tablet 4 mg (4 mg Oral Given 03/09/24 1324)    ED Course/ Medical Decision Making/ A&P    Patient seen and examined. History obtained directly from patient.   Labs/EKG: Ordered CBC, BMP and viral panel ordered in triage.  I have added hepatic function panel and lipase.  UA pending.  Imaging: Ordered CT abdomen pelvis renal protocol  Medications/Fluids: Ordered: IV Toradol, IV Zofran.   Most recent vital signs reviewed and are as follows: BP 133/84 (BP Location: Left Arm)   Pulse 78   Temp 98 F (36.7 C)   Resp 18   Ht 5\' 8"  (1.727 m)   Wt 72.6 kg   SpO2 100%   BMI 24.33 kg/m   Initial impression: Right-sided abdominal pain, acute onset  2:51 PM Reassessment performed. Patient appears more comfortable.  Continues to have nausea.  Continues to have right lateral to upper abdominal tenderness on exam.  Labs personally reviewed and interpreted including: CBC elevated white blood cell count with normal hemoglobin; BMP slightly low potassium at 3.3, glucose 138, normal kidney function.  Viral panel negative.  Imaging personally visualized and interpreted including: CT, agree gallstone, no ureteral stone, normal appendix.  Reviewed pertinent lab work and imaging with patient at bedside. Questions answered.   Most current vital signs reviewed and are as follows: BP (!) 149/91   Pulse 72   Temp 98 F (36.7 C)   Resp 17   Ht 5\' 8"  (1.727 m)   Wt 72.6 kg   SpO2 100%   BMI 24.33 kg/m   Plan: Right upper quadrant ultrasound, additional medication, Reglan, ordered for nausea.  Pain currently controlled.  Awaiting hepatic function panel and lipase.  4:49 PM Reassessment performed. Patient appears.  States that nausea is better.  Imaging personally visualized and interpreted including: Ultrasound with distended gallbladder but no pericholecystic fluid or gallbladder wall thickening.  Solitary gallstone  noted.  Reviewed pertinent lab work and imaging with patient at bedside. Questions answered.   Most current vital signs reviewed and are as follows: BP (!) 149/91   Pulse 72   Temp 98 F (36.7 C)   Resp 17   Ht 5\' 8"  (1.727 m)   Wt 72.6 kg   SpO2 100%   BMI 24.33 kg/m   Plan: Patient discussed with Dr. Jearld Fenton who will see.  6:43 PM Reassessment performed. Patient appears uncomfortable.  In the interim I have spoken with general  surgery, Dr. Fredricka Bonine, and discussed imaging and lab workup.  Recommended p.o. trial, if sufficiently symptomatic, admit to hospitalist service with surgical consult.  If she did well with that and wanted to go home, then can be discharged with close outpatient follow-up.  P.o. challenge was performed with crackers and water.  Patient had immediate worsening of pain and return of significant nausea.  Discussed options with patient and husband and she is agreeable to staying.  Most current vital signs reviewed and are as follows: BP 123/80   Pulse 76   Temp 98.7 F (37.1 C) (Oral)   Resp 15   Ht 5\' 8"  (1.727 m)   Wt 72.6 kg   SpO2 98%   BMI 24.33 kg/m   Plan: Admit to hospital.   Discussed case with Dr. Antionette Char of [facility] hospitalist who will admit for symptom control.                                Medical Decision Making Amount and/or Complexity of Data Reviewed Labs: ordered. Radiology: ordered.  Risk Prescription drug management. Decision regarding hospitalization.   For this patient's complaint of abdominal pain, the following conditions were considered on the differential diagnosis: gastritis/PUD, enteritis/duodenitis, appendicitis, cholelithiasis/cholecystitis, cholangitis, pancreatitis, ruptured viscus, colitis, diverticulitis, small/large bowel obstruction, proctitis, cystitis, pyelonephritis, ureteral colic, aortic dissection, aortic aneurysm. In women, ectopic pregnancy, pelvic inflammatory disease, ovarian cysts, and tubo-ovarian abscess  were also considered. Atypical chest etiologies were also considered including ACS, PE, and pneumonia.  Symptoms are not sufficiently controlled.  Will treat as acute cholecystitis until definitive diagnosis is made.  Admit to medical service.        Final Clinical Impression(s) / ED Diagnoses Final diagnoses:  RUQ abdominal pain  Nausea and vomiting, unspecified vomiting type    Rx / DC Orders ED Discharge Orders     None         Renne Crigler, PA-C 03/09/24 1845    Loetta Rough, MD 03/10/24 [phone]

## 2024-03-09 NOTE — H&P (Addendum)
 History and Physical    Kara Owens ZOX:[id number] DOB: Oct 16, 1971 DOA: 03/09/2024  PCP: Laurann Montana, MD   Patient coming from: Home   Chief Complaint:  Chief Complaint  Patient presents with   Flank Pain   ED TRIAGE note:    Pt reports flank pain that started 0930 today, chills & n/v; denies cough, fever, hematuria, dysuria, HA, sore throat or other sxs; denies hx of kidney stones      HPI:  Kara Owens is a 53 y.o. female with medical history significant of mitral valve prolapse status post repair not on any anticoagulation except aspirin, paroxysmal atrial fibrillation without any recent episode presented to emergency department complaining of right-sided flank pain with associated nausea and vomiting that started around 9 AM today.  Patient denies any fever, chill, hematuria and dysuria however complaining about increased urinary frequency urgency.  ED physician consulted and spoke with general surgery Dr. Doylene Canard recommended p.o. trial, if sufficient symptomatic admit to hospitalist service with surgical consult.  If patient does well she can be discharged to home.  In the ED patient failed p.o. challenge with worsening nausea and abdominal pain and she has been transferred to [facility].  At presentation to ED patient is hemodynamically stable. BMP unremarkable except low potassium 3.3, low bicarb 19. CBC showed leukocytosis 13.5. Hepatic panel showing elevated AST 50, elevated bilirubin 1.3. Normal lipase level. UA unremarkable except rare bacteria.  Ultrasound of the abdomen showing distended gallbladder containing single gallstone.  No sonographic evidence of acute cholecystitis.  No hepatic bile duct dilation.  Distal CBD is obscured by overlying bowel gas.  CT renal stone study no nephrolithiasis and obstructive uropathy.  Normal appendix.  Gallstones without any evidence of acute cholecystitis.  As patient has been failed p.o. challenge and having  persistent abdominal pain and nausea has [facility] and general surgery consult.  In the ED patient has been treated with ceftriaxone 2 g, Toradol 15 mg, Reglan 10 mg, Zofran 4 mg x 2 and 1 L of NS bolus.  Currently on maintenance fluid NS 125 cc/h.   Significant labs in the ED: Lab Orders         Resp panel by RT-PCR (RSV, Flu A&B, Covid) Anterior Nasal Swab         Urine Culture (for pregnant, neutropenic or urologic patients or patients with an indwelling urinary catheter)         Urinalysis, Routine w reflex microscopic -Urine, Clean Catch         Basic metabolic panel         CBC         Hepatic function panel         Lipase, blood         Urinalysis, Microscopic (reflex)         Comprehensive metabolic panel         CBC         Protime-INR         APTT         HIV Antibody (routine testing w rflx)       Review of Systems:  Review of Systems  Constitutional:  Negative for chills, fever, malaise/fatigue and weight loss.  Respiratory:  Negative for cough, sputum production and shortness of breath.   Cardiovascular:  Negative for chest pain, palpitations, claudication and leg swelling.  Gastrointestinal:  Positive for abdominal pain, nausea and vomiting. Negative for blood in stool,  constipation, diarrhea, heartburn and melena.  Genitourinary:  Positive for flank pain, frequency and urgency. Negative for dysuria and hematuria.  Musculoskeletal:  Negative for back pain, joint pain, myalgias and neck pain.  Neurological:  Negative for dizziness and headaches.  Psychiatric/Behavioral:  The patient is not nervous/anxious.   All other systems reviewed and are negative.   Past Medical History:  Diagnosis Date   Arrhythmia    post op atrial fibrillation   Chest pain    Left sided--sharp pain   Complication of anesthesia    Fatigue    Extreme   Mitral regurgitation    Mild   Mitral valve prolapse    Palpitations    PONV  (postoperative nausea and vomiting)    S/P minimally invasive mitral valve repair 09/19/2018   Artificial Gore-tex neochord placement x10 with 32 mm Sorin Memo 4D ring annuloplasty via right mini thoracotomy approach    Past Surgical History:  Procedure Laterality Date   EYE SURGERY Left    retinal repair    MITRAL VALVE REPAIR Right 09/19/2018   Procedure: MINIMALLY INVASIVE MITRAL VALVE REPAIR (MVR). 32mm MEMO 4D RING.;  Surgeon: Purcell Nails, MD;  Location: [facility] OR;  Service: Open Heart Surgery;  Laterality: Right;  GLUTARALDEHYDE   TEE WITHOUT CARDIOVERSION N/A 12/08/2017   Procedure: TRANSESOPHAGEAL ECHOCARDIOGRAM (TEE);  Surgeon: Elease Hashimoto Deloris Ping, MD;  Location[facility] ENDOSCOPY;  Service: Cardiovascular;  Laterality: N/A;   TEE WITHOUT CARDIOVERSION N/A 07/27/2018   Procedure: TRANSESOPHAGEAL ECHOCARDIOGRAM (TEE);  Surgeon: Elease Hashimoto Deloris Ping, MD;  Location: [facility] ENDOSCOPY;  Service: Cardiovascular;  Laterality: N/A;   TEE WITHOUT CARDIOVERSION N/A 09/19/2018   Procedure: TRANSESOPHAGEAL ECHOCARDIOGRAM (TEE);  Surgeon: Purcell Nails, MD;  Location: [facility] OR;  Service: Open Heart Surgery;  Laterality: N/A;   TONSILLECTOMY     TONSILLECTOMY       reports that she has never smoked. She has never used smokeless tobacco. She reports that she does not drink alcohol and does not use drugs.  Allergies  Allergen Reactions   Calcium Nausea Only    Family History  Problem Relation Age of Onset   Hypertension Mother    Heart failure Mother    Hypertension Father    Skin cancer Father    Heart attack Brother     Prior to Admission medications   Medication Sig Start Date End Date Taking? Authorizing Provider  acetaminophen (TYLENOL) 500 MG tablet Take 1,000 mg by mouth every 6 (six) hours as needed (for pain).    [provider]  amoxicillin (AMOXIL) 500 MG tablet Take 4 tablets (2,000 mg total) by mouth 1 hour prior to dental work/cleaning. 01/10/24   [name], Deloris Ping, MD  aspirin EC  81 MG EC tablet Take 1 tablet (81 mg total) by mouth daily. 09/24/18   Barrett, Erin R, PA-C  Calcium Citrate-Vitamin D 200-250 MG-UNIT TABS Take 1 tablet by mouth daily.    [provider]  estradiol (ESTRACE) 2 MG tablet Take 2 mg by mouth daily. 07/18/22   [provider]  ibuprofen (ADVIL) 600 MG tablet Take by mouth. 12/07/21   [provider]  polyethylene glycol (MIRALAX / GLYCOLAX) packet Take 17 g by mouth every other day.     [provider]  progesterone (PROMETRIUM) 100 MG capsule Take 100 mg by mouth daily. 07/18/22   [provider]     Physical Exam: Vitals:   03/09/24 1735 03/09/24 1900 03/09/24 1945 03/09/24 2042  BP: 123/80 132/79  133/75 127/86  Pulse: 76 77 77 78  Resp: 15 16 15 15   Temp:    98.1 F (36.7 C)  TempSrc:    Oral  SpO2: 98% 100% 100% 98%  Weight:      Height:        Physical Exam Vitals and nursing note reviewed.  Eyes:     Pupils: Pupils are equal, round, and reactive to light.  Cardiovascular:     Rate and Rhythm: Normal rate and regular rhythm.     Pulses: Normal pulses.     Heart sounds: Normal heart sounds.  Pulmonary:     Effort: Pulmonary effort is normal.     Breath sounds: Normal breath sounds.  Abdominal:     General: There is no distension.     Palpations: Abdomen is soft.     Tenderness: There is no abdominal tenderness. There is no guarding or rebound.  Musculoskeletal:     Cervical back: Neck supple.  Skin:    General: Skin is dry.     Capillary Refill: Capillary refill takes less than 2 seconds.  Neurological:     Mental Status: She is alert and oriented to person, place, and time.  Psychiatric:        Mood and Affect: Mood normal.        Thought Content: Thought content normal.      Labs on Admission: I have personally reviewed following labs and imaging studies  CBC: Recent Labs  Lab 03/09/24 1315  WBC 13.5*  HGB 13.3  HCT 38.2  MCV 89.9  PLT 229   Basic Metabolic  Panel: Recent Labs  Lab 03/09/24 1315  NA 135  K 3.3*  CL 103  CO2 19*  GLUCOSE 138*  BUN 20  CREATININE 0.76  CALCIUM 9.9   GFR: Estimated Creatinine Clearance: 81.1 mL/min (by C-G formula based on SCr of 0.76 mg/dL). Liver Function Tests: Recent Labs  Lab 03/09/24 1406  AST 50*  ALT 27  ALKPHOS 55  BILITOT 1.3*  PROT 7.2  ALBUMIN 4.1   Recent Labs  Lab 03/09/24 1406  LIPASE 41   No results for input(s): "AMMONIA" in the last 168 hours. Coagulation Profile: No results for input(s): "INR", "PROTIME" in the last 168 hours. Cardiac Enzymes: No results for input(s): "CKTOTAL", "CKMB", "CKMBINDEX", "TROPONINI", "TROPONINIHS" in the last 168 hours. BNP (last 3 results) No results for input(s): "BNP" in the last 8760 hours. HbA1C: No results for input(s): "HGBA1C" in the last 72 hours. CBG: No results for input(s): "GLUCAP" in the last 168 hours. Lipid Profile: No results for input(s): "CHOL", "HDL", "LDLCALC", "TRIG", "CHOLHDL", "LDLDIRECT" in the last 72 hours. Thyroid Function Tests: No results for input(s): "TSH", "T4TOTAL", "FREET4", "T3FREE", "THYROIDAB" in the last 72 hours. Anemia Panel: No results for input(s): "VITAMINB12", "FOLATE", "FERRITIN", "TIBC", "IRON", "RETICCTPCT" in the last 72 hours. Urine analysis:    Component Value Date/Time   COLORURINE YELLOW 03/09/2024 1700   APPEARANCEUR CLEAR 03/09/2024 1700   LABSPEC 1.025 03/09/2024 1700   PHURINE 8.5 (H) 03/09/2024 1700   GLUCOSEU NEGATIVE 03/09/2024 1700   HGBUR TRACE (A) 03/09/2024 1700   HGBUR trace-lysed 09/12/2008 0903   BILIRUBINUR NEGATIVE 03/09/2024 1700   KETONESUR 40 (A) 03/09/2024 1700   PROTEINUR NEGATIVE 03/09/2024 1700   UROBILINOGEN 0.2 09/12/2008 0903   NITRITE NEGATIVE 03/09/2024 1700   LEUKOCYTESUR NEGATIVE 03/09/2024 1700    Radiological Exams on Admission: I have personally reviewed images US Abdomen Limited RUQ (LIVER/GB) Result Date:  03/09/2024 CLINICAL DATA:  [id number]  Abdominal pain [id number] EXAM: ULTRASOUND ABDOMEN LIMITED RIGHT UPPER QUADRANT COMPARISON:  CT scan renal stone protocol from earlier the same day FINDINGS: Gallbladder: The gallbladder is distended measuring up to 9.7 cm in length and 4.2 cm in with. No abnormal wall thickening. There is a single small gallstone within. No pericholecystic free fluid. Common bile duct: Diameter: Extrahepatic bile duct measuring up to 7-8 mm in the proximal portion. In the midportion it is measured up to 4 mm. Distal CBD is obscured by overlying bowel gas. No intrahepatic bile duct dilation. Liver: No focal lesion identified. Within normal limits in parenchymal echogenicity. Portal vein is patent on color Doppler imaging with normal direction of blood flow towards the liver. Other: None. IMPRESSION: 1. Distended gallbladder containing a single small gallstone. No abnormal wall thickening or pericholecystic free fluid. No sonographic evidence of acute cholecystitis. 2. Extrahepatic bile duct measuring up to 7-8 mm in the proximal portion. No intrahepatic bile duct dilation. Distal CBD is obscured by overlying bowel gas. Correlate clinically and with liver function tests to determine the need for additional imaging with MRI/MRCP. Electronically Signed   By: Jules Schick M.D.   On: 03/09/2024 15:48   CT Renal Stone Study Result Date: 03/09/2024 CLINICAL DATA:  Abdominal pain and flank pain. Renal calculus suspected. EXAM: CT ABDOMEN AND PELVIS WITHOUT CONTRAST TECHNIQUE: Multidetector CT imaging of the abdomen and pelvis was performed following the standard protocol without IV contrast. RADIATION DOSE REDUCTION: This exam was performed according to the departmental dose-optimization program which includes automated exposure control, adjustment of the mA and/or kV according to patient size and/or use of iterative reconstruction technique. COMPARISON:  CT 09/25/2014 FINDINGS: Lower chest: Calcification over the RIGHT hemidiaphragm  appears benign. No suspicious pulmonary nodules. Hepatobiliary: No focal hepatic lesion. Round solitary 9 mm gallstone. No gallbladder inflammation. No biliary duct dilatation. Common bile duct is normal. Pancreas: Pancreas is normal. No ductal dilatation. No pancreatic inflammation. Spleen: Normal spleen Adrenals/urinary tract: Adrenal glands are normal. No nephrolithiasis or ureterolithiasis. No obstructive uropathy. Stomach/Bowel: Stomach, small-bowel cecum normal. Appendix normal. The colon and rectosigmoid colon are normal. Vascular/Lymphatic: Abdominal aorta is normal caliber. No periportal or retroperitoneal adenopathy. No pelvic adenopathy. Reproductive: Uterus and adnexa unremarkable. Other: No free fluid. Musculoskeletal: No aggressive osseous lesion. IMPRESSION: 1. No nephrolithiasis, ureterolithiasis, or obstructive uropathy. 2. Normal appendix. 3. Gallstone without evidence cholecystitis. Electronically Signed   By: Genevive Bi M.D.   On: 03/09/2024 14:42     EKG: Pending EKG.  Assessment/Plan: Principal Problem:   Abdominal pain with vomiting Active Problems:   Cholelithiasis   Acute cystitis   History of mitral valve repair   Hypokalemia   Metabolic acidosis   Hormone replacement therapy (HRT)    Assessment and Plan: Nausea, vomiting and abdominal pain Cholelithiasis -Patient presenting to emergency department with sudden onset of right-sided flank pain with associated nausea vomiting.  In the ED patient is hemodynamically stable.  CBC showing leukocytosis. CMP elevated AST 50, normal AST and ALP level.  Elevated total bilirubin 1.3. - Right upper quadrant ultrasound showed distended gallbladder containing a small gallstone no evidence of acute cholecystitis.  Extrahepatic bile duct measuring up to 7 to 8 mm.  No intrahepatic bile duct dilation. -CT abdomen pelvis no evidence of nephrolithiasis urolithiasis or obstructive uropathy.  Normal appendix.  Gallstones without  evidence of acute cholecystitis - Given CT abdomen pelvis showing some distended gallbladder continue empiric IV antibiotic therapy with IV ceftriaxone  2 g daily and metronidazole 500 mg twice daily until cholecystectomy. - Spoke with general surgery Dr. Fredricka Bonine who will evaluate patient early morning and patient will possibly undergo cholecystectomy tomorrow.  Continue clear liquid diet and keep patient n.p.o. after midnight. -Continue Zofran and morphine as needed. - Continue maintenance fluid NS 75 cc/h. -Appreciate general surgery consult and recommendation.   Acute cystitis - Patient is complaining about increased urinary urgency and frequency.  CT abdomen pelvis no evidence of nephrolithiasis/urolithiasis - UA showing positive bacteria. -Obtaining urine culture - Currently on IV ceftriaxone.  Hypokalemia -Low potassium 3.3 replating with IV KCl 39M EQ x 2  Metabolic acidosis -Low bicarb 19.  Giving oral bicarb 650 mg x 2 for 1 day.  Nonanion gap metabolic acidosis in the setting of vomiting.  Continue to monitor bicarb level.   History of mitral valve prolapse status post repair - Patient is not on any anticoagulation at home except aspirin. - Continue aspirin 81 mg daily.   Postmenopausal syndrome-hormone replacement therapy - At home patient is on estradiol 2 mg and progesterone 100 mg daily.  Holding these 2 medications as high risk of development of DVT or PE postsurgery.  Given I am not able to start any IV DVT prophylaxis with high risk of bleeding holding hormone replacement therapy and will plan to resume DVT prophylaxis and hormonal replacement therapy postsurgery.   Previous history of paroxysmal atrial fibrillation - Per chart review patient has history of Barrett's and atrial fibrillation back in 2019.  She did not had any recurrent episode of atrial fibrillation.  Patient used to be on Tikosyn and Eliquis  for a while however given she did not had any persistent  atrial fibrillation that has been taken up by cardiology and started on low-dose Lopressor but patient has poor tolerance to metoprolol which has been discontinued afterward.  Currently not on any medication at all and sinus rhythm. -Verified with patient multiple times, not been taking Eliquis and Tikosyn anymore neither metoprolol.  DVT prophylaxis:  SCDs.  Deferring pharmacological DVT prophylaxis in case patient will undergo cholecystectomy tomorrow to prevent high risk of bleeding. Code Status:  Full Code Diet:  Family Communication:   Family was present at bedside, at the time of interview. Opportunity was given to ask question and all questions were answered satisfactorily.  Disposition Plan: Tentative plan for cholecystectomy tomorrow.  General surgery Dr. Doristine Counter aware of the patient. Consults: General Surgery Admission status:   Inpatient, Telemetry bed  Severity of Illness: The appropriate patient status for this patient is INPATIENT. Inpatient status is judged to be reasonable and necessary in order to provide the required intensity of service to ensure the patient's safety. The patient's presenting symptoms, physical exam findings, and initial radiographic and laboratory data in the context of their chronic comorbidities is felt to place them at high risk for further clinical deterioration. Furthermore, it is not anticipated that the patient will be medically stable for discharge from the hospital within 2 midnights of admission.   * I certify that at the point of admission it is my clinical judgment that the patient will require inpatient hospital care spanning beyond 2 midnights from the point of admission due to high intensity of service, high risk for further deterioration and high frequency of surveillance required.Marland Kitchen    Tereasa Coop, MD [facility]  How to contact the [facility] Attending or Consulting provider 7A - 7P or covering provider during after hours 7P -7A, for this  patient.  Check the care team in [facility] and look for a) attending/consulting [facility] provider listed and b) the [facility] team listed Log into [email or web address] and use [facility]'s universal password to access. If you do not have the password, please contact the hospital operator. Locate the [facility] provider you are looking for under [facility] and page to a number that you can be directly reached. If you still have difficulty reaching the provider, please page the [facility] (Director on Call) for the Hospitalists listed on amion for assistance.  03/09/2024, 11:45 PM

## 2024-03-09 NOTE — ED Notes (Signed)
 Patient transported to CT

## 2024-03-09 NOTE — Progress Notes (Signed)
 Plan of Care Note for accepted transfer   Patient: Kara Owens MRN: [id number]   DOA: 03/09/2024  Facility requesting transfer: [facility]   Requesting Provider: Rhea Bleacher, PA   Reason for transfer: Abdominal pain with N/V   Facility course: 53 yr old female with hx of mitral valve repair presents with abdominal pain and N/V that began this morning. She is afebrile with WBC 13,500, no acute findings on CT renal stone study, and distended gallbladder without wall-thickening or pericholecystic fluid on [place].   Surgery (Dr. Fredricka Bonine) will consult. Patient was treated with 1 liter NS, Zofran, Reglan, Toradol, Dilaudid, and Rocephin.   Plan of care: The patient is accepted for admission to Med-surg  unit, at [facility].   Author: Briscoe Deutscher, MD 03/09/2024  Check [email or web address] for on-call coverage.  Nursing staff, Please call [facility] Admits & Consults System-Wide number on Amion as soon as patient's arrival, so appropriate admitting provider can evaluate the pt.

## 2024-03-09 NOTE — ED Notes (Signed)
 Rec'd message from [place], [place]; provided update and answered questions

## 2024-03-09 NOTE — H&P (View-Only) (Signed)
 Surgical Evaluation Requesting provider: Rhea Bleacher PA-C  Chief Complaint: abdominal pain  HPI: 52yo with h/o MV repair who developred acute right flank/abdominal pain yesterday around 9am. Associated with nausea/vomiting. Noted to have RUQ tenderness and gallstones on CT/US with some gallbladder distention, proximal CBD 7-49mm but without overt signs of cholecystitis, WBC 13.5, LFTs (AST, indirect bilirubin) minimally elevated. Symptoms transiently controlled with meds at [facility] but recurred with PO challenge.  She was subsequently transferred to [facility] for further care. This morning she reports she is feeling significantly better.  No previous abdominal surgery.  She just retired 2 weeks ago.  Allergies  Allergen Reactions   Calcium Nausea Only    Past Medical History:  Diagnosis Date   Arrhythmia    post op atrial fibrillation   Chest pain    Left sided--sharp pain   Complication of anesthesia    Fatigue    Extreme   Mitral regurgitation    Mild   Mitral valve prolapse    Palpitations    PONV (postoperative nausea and vomiting)    S/P minimally invasive mitral valve repair 09/19/2018   Artificial Gore-tex neochord placement x10 with 32 mm Sorin Memo 4D ring annuloplasty via right mini thoracotomy approach    Past Surgical History:  Procedure Laterality Date   EYE SURGERY Left    retinal repair    MITRAL VALVE REPAIR Right 09/19/2018   Procedure: MINIMALLY INVASIVE MITRAL VALVE REPAIR (MVR). 32mm MEMO 4D RING.;  Surgeon: Purcell Nails, MD;  Location: [facility] OR;  Service: Open Heart Surgery;  Laterality: Right;  GLUTARALDEHYDE   TEE WITHOUT CARDIOVERSION N/A 12/08/2017   Procedure: TRANSESOPHAGEAL ECHOCARDIOGRAM (TEE);  Surgeon: Elease Hashimoto Deloris Ping, MD;  Location: [facility] ENDOSCOPY;  Service: Cardiovascular;  Laterality: N/A;   TEE WITHOUT CARDIOVERSION N/A 07/27/2018   Procedure: TRANSESOPHAGEAL ECHOCARDIOGRAM (TEE);  Surgeon: Elease Hashimoto Deloris Ping, MD;  Location: [facility]  ENDOSCOPY;  Service: Cardiovascular;  Laterality: N/A;   TEE WITHOUT CARDIOVERSION N/A 09/19/2018   Procedure: TRANSESOPHAGEAL ECHOCARDIOGRAM (TEE);  Surgeon: Purcell Nails, MD;  Location: [facility] OR;  Service: Open Heart Surgery;  Laterality: N/A;   TONSILLECTOMY     TONSILLECTOMY      Family History  Problem Relation Age of Onset   Hypertension Mother    Heart failure Mother    Hypertension Father    Skin cancer Father    Heart attack Brother     Social History   Socioeconomic History   Marital status: Married    Spouse name: Not on file   Number of children: Not on file   Years of education: Not on file   Highest education level: High school graduate  Occupational History   Not on file  Tobacco Use   Smoking status: Never   Smokeless tobacco: Never  Vaping Use   Vaping status: Never Used  Substance and Sexual Activity   Alcohol use: Never   Drug use: Never   Sexual activity: Not on file  Other Topics Concern   Not on file  Social History Narrative   Not on file   Social Drivers of Health   Financial Resource Strain: Low Risk  (12/18/2018)   Overall Financial Resource Strain (CARDIA)    Difficulty of Paying Living Expenses: Not hard at all  Food Insecurity: No Food Insecurity (12/18/2018)   Hunger Vital Sign    Worried About Running Out of Food in the Last Year: Never true    Ran Out of Food in the Last  Year: Never true  Transportation Needs: No Transportation Needs (12/18/2018)   PRAPARE - Administrator, Civil Service (Medical): No    Lack of Transportation (Non-Medical): No  Physical Activity: Inactive (12/18/2018)   Exercise Vital Sign    Days of Exercise per Week: 0 days    Minutes of Exercise per Session: 0 min  Stress: Stress Concern Present (12/18/2018)   [employer] of Occupational Health - Occupational Stress Questionnaire    Feeling of Stress : Rather much  Social Connections: Not on file    No current facility-administered  medications on file prior to encounter.   Current Outpatient Medications on File Prior to Encounter  Medication Sig Dispense Refill   acetaminophen (TYLENOL) 500 MG tablet Take 1,000 mg by mouth every 6 (six) hours as needed (for pain).     amoxicillin (AMOXIL) 500 MG tablet Take 4 tablets (2,000 mg total) by mouth 1 hour prior to dental work/cleaning. 4 tablet 3   aspirin EC 81 MG EC tablet Take 1 tablet (81 mg total) by mouth daily.     Calcium Citrate-Vitamin D 200-250 MG-UNIT TABS Take 1 tablet by mouth daily.     estradiol (ESTRACE) 2 MG tablet Take 2 mg by mouth daily.     ibuprofen (ADVIL) 600 MG tablet Take by mouth.     polyethylene glycol (MIRALAX / GLYCOLAX) packet Take 17 g by mouth every other day.      progesterone (PROMETRIUM) 100 MG capsule Take 100 mg by mouth daily.      Review of Systems: a complete, 10pt review of systems was completed with pertinent positives and negatives as documented in the HPI  Physical Exam: Vitals:   03/09/24 1645 03/09/24 1735  BP: 135/85 123/80  Pulse: 72 76  Resp: 16 15  Temp: 98.7 F (37.1 C)   SpO2: 100% 98%   Gen: A&Ox3, no distress  Eyes: lids and conjunctivae normal, no icterus.  Chest: respiratory effort is normal.  Cardiovascular: RRR with palpable distal pulses Gastrointestinal: soft, nondistended, mildly tender in the epigastrium and right upper quadrant without guarding or peritoneal signs. No mass, hepatomegaly or splenomegaly.  Muscoloskeletal: no clubbing or cyanosis of the fingers.  Strength is symmetrical throughout.  Range of motion of bilateral upper and lower extremities normal without pain, crepitation or contracture. Neuro: No gross deficit. Psych: appropriate mood and affect, normal insight/judgment intact  Skin: warm and dry      Latest Ref Rng & Units 03/09/2024    1:15 PM 09/22/2018    5:08 AM 09/21/2018    2:59 AM  CBC  WBC 4.0 - 10.5 K/uL 13.5  10.4  15.3   Hemoglobin 12.0 - 15.0 g/dL 40.9  8.8  9.2    Hematocrit 36.0 - 46.0 % 38.2  27.2  28.5   Platelets 150 - 400 K/uL 229  109  93        Latest Ref Rng & Units 03/09/2024    2:06 PM 03/09/2024    1:15 PM 09/22/2018    5:08 AM  CMP  Glucose 70 - 99 mg/dL  811  914   BUN 6 - 20 mg/dL  20  6   Creatinine 7.82 - 1.00 mg/dL  9.56  2.13   Sodium 086 - 145 mmol/L  135  137   Potassium 3.5 - 5.1 mmol/L  3.3  3.6   Chloride 98 - 111 mmol/L  103  105   CO2 22 - 32 mmol/L  19  25   Calcium 8.9 - 10.3 mg/dL  9.9  8.3   Total Protein 6.5 - 8.1 g/dL 7.2     Total Bilirubin 0.0 - 1.2 mg/dL 1.3     Alkaline Phos 38 - 126 U/L 55     AST 15 - 41 U/L 50     ALT 0 - 44 U/L 27       Lab Results  Component Value Date   INR 3.4 (A) 01/04/2019   INR 2.3 12/24/2018   INR 1.6 (A) 12/18/2018    Imaging: US Abdomen Limited RUQ (LIVER/GB) Result Date: 03/09/2024 CLINICAL DATA:  [id number] Abdominal pain [id number] EXAM: ULTRASOUND ABDOMEN LIMITED RIGHT UPPER QUADRANT COMPARISON:  CT scan renal stone protocol from earlier the same day FINDINGS: Gallbladder: The gallbladder is distended measuring up to 9.7 cm in length and 4.2 cm in with. No abnormal wall thickening. There is a single small gallstone within. No pericholecystic free fluid. Common bile duct: Diameter: Extrahepatic bile duct measuring up to 7-8 mm in the proximal portion. In the midportion it is measured up to 4 mm. Distal CBD is obscured by overlying bowel gas. No intrahepatic bile duct dilation. Liver: No focal lesion identified. Within normal limits in parenchymal echogenicity. Portal vein is patent on color Doppler imaging with normal direction of blood flow towards the liver. Other: None. IMPRESSION: 1. Distended gallbladder containing a single small gallstone. No abnormal wall thickening or pericholecystic free fluid. No sonographic evidence of acute cholecystitis. 2. Extrahepatic bile duct measuring up to 7-8 mm in the proximal portion. No intrahepatic bile duct dilation. Distal CBD is obscured by  overlying bowel gas. Correlate clinically and with liver function tests to determine the need for additional imaging with MRI/MRCP. Electronically Signed   By: Jules Schick M.D.   On: 03/09/2024 15:48   CT Renal Stone Study Result Date: 03/09/2024 CLINICAL DATA:  Abdominal pain and flank pain. Renal calculus suspected. EXAM: CT ABDOMEN AND PELVIS WITHOUT CONTRAST TECHNIQUE: Multidetector CT imaging of the abdomen and pelvis was performed following the standard protocol without IV contrast. RADIATION DOSE REDUCTION: This exam was performed according to the departmental dose-optimization program which includes automated exposure control, adjustment of the mA and/or kV according to patient size and/or use of iterative reconstruction technique. COMPARISON:  CT 09/25/2014 FINDINGS: Lower chest: Calcification over the RIGHT hemidiaphragm appears benign. No suspicious pulmonary nodules. Hepatobiliary: No focal hepatic lesion. Round solitary 9 mm gallstone. No gallbladder inflammation. No biliary duct dilatation. Common bile duct is normal. Pancreas: Pancreas is normal. No ductal dilatation. No pancreatic inflammation. Spleen: Normal spleen Adrenals/urinary tract: Adrenal glands are normal. No nephrolithiasis or ureterolithiasis. No obstructive uropathy. Stomach/Bowel: Stomach, small-bowel cecum normal. Appendix normal. The colon and rectosigmoid colon are normal. Vascular/Lymphatic: Abdominal aorta is normal caliber. No periportal or retroperitoneal adenopathy. No pelvic adenopathy. Reproductive: Uterus and adnexa unremarkable. Other: No free fluid. Musculoskeletal: No aggressive osseous lesion. IMPRESSION: 1. No nephrolithiasis, ureterolithiasis, or obstructive uropathy. 2. Normal appendix. 3. Gallstone without evidence cholecystitis. Electronically Signed   By: Genevive Bi M.D.   On: 03/09/2024 14:42     A/P:  Likely acute calculous cholecystitis, borderline proximal CBD caliber -Empiric abx -Pain/nausea  control -IV fluid resuscitaion -Her white count has improved this morning from 13.5-11.2, her bilirubin and alk phos remain normal, she does have a mild transaminitis with a AST of 144 and ALT of 134.  At this point would recommend proceeding with laparoscopic cholecystectomy with intraoperative cholangiogram.  I reviewed the surgery  with the patient including relevant anatomy, surgical technique, and risks including bleeding, infection, pain, scarring, injury to intra-abdominal structures specifically the common bile duct and sequelae, need for conversion to subtotal cholecystectomy, bile leak, conversion to open surgery, failure to resolve symptoms, as well as general cardiovascular/pulmonary/thromboembolic complications.  Questions were welcomed and answered to the patient's satisfaction.  Will plan surgery this morning with Dr. Donell Beers.  Please keep n.p.o. for now.    Patient Active Problem List   Diagnosis Date Noted   H/O mitral valve repair 02/13/2019   Atrial fibrillation (HCC) 09/28/2018   S/P minimally invasive mitral valve repair 09/19/2018   MVP (mitral valve prolapse)    Palpitations    Mitral regurgitation    Fatigue    Chest pain    LEG EDEMA 04/03/2009   SYNCOPE 02/16/2009   ANEMIA-IRON DEFICIENCY 09/18/2008   Mitral valve prolapse 09/18/2008   ASTHMATIC BRONCHITIS, ACUTE 09/12/2007   PHARYNGITIS, ACUTE 09/07/2007   SKIN CANCER, HX OF 09/07/2007       Phylliss Blakes, MD [facility]  See AMION to contact appropriate on-call provider

## 2024-03-09 NOTE — ED Notes (Signed)
 [facility] called for transport.

## 2024-03-09 NOTE — Consult Note (Incomplete)
 Surgical Evaluation Requesting provider: Rhea Bleacher PA-C  Chief Complaint: abdominal pain  HPI: 53yo with h/o MV repair who developred acute right flank/abdominal pain yesterday around 9am. Associated with nausea/vomiting. Noted to have RUQ tenderness and gallstones on CT/US with some gallbladder distention, proximal CBD 7-39mm but without overt signs of cholecystitis, WBC 13.5, LFTs (AST, indirect bilirubin) minimally elevated. Symptoms transiently controlled with meds at [facility] but recurred with PO challenge.  She was subsequently transferred to [facility] for further care. This morning she reports she is feeling significantly better.  No previous abdominal surgery.  She just retired 2 weeks ago.  Allergies  Allergen Reactions   Calcium Nausea Only    Past Medical History:  Diagnosis Date   Arrhythmia    post op atrial fibrillation   Chest pain    Left sided--sharp pain   Complication of anesthesia    Fatigue    Extreme   Mitral regurgitation    Mild   Mitral valve prolapse    Palpitations    PONV (postoperative nausea and vomiting)    S/P minimally invasive mitral valve repair 09/19/2018   Artificial Gore-tex neochord placement x10 with 32 mm Sorin Memo 4D ring annuloplasty via right mini thoracotomy approach    Past Surgical History:  Procedure Laterality Date   EYE SURGERY Left    retinal repair    MITRAL VALVE REPAIR Right 09/19/2018   Procedure: MINIMALLY INVASIVE MITRAL VALVE REPAIR (MVR). 32mm MEMO 4D RING.;  Surgeon: Purcell Nails, MD;  Location: [facility] OR;  Service: Open Heart Surgery;  Laterality: Right;  GLUTARALDEHYDE   TEE WITHOUT CARDIOVERSION N/A 12/08/2017   Procedure: TRANSESOPHAGEAL ECHOCARDIOGRAM (TEE);  Surgeon: Elease Hashimoto Deloris Ping, MD;  Location: [facility] ENDOSCOPY;  Service: Cardiovascular;  Laterality: N/A;   TEE WITHOUT CARDIOVERSION N/A 07/27/2018   Procedure: TRANSESOPHAGEAL ECHOCARDIOGRAM (TEE);  Surgeon: Elease Hashimoto Deloris Ping, MD;  Location: [facility]  ENDOSCOPY;  Service: Cardiovascular;  Laterality: N/A;   TEE WITHOUT CARDIOVERSION N/A 09/19/2018   Procedure: TRANSESOPHAGEAL ECHOCARDIOGRAM (TEE);  Surgeon: Purcell Nails, MD;  Location: [facility] OR;  Service: Open Heart Surgery;  Laterality: N/A;   TONSILLECTOMY     TONSILLECTOMY      Family History  Problem Relation Age of Onset   Hypertension Mother    Heart failure Mother    Hypertension Father    Skin cancer Father    Heart attack Brother     Social History   Socioeconomic History   Marital status: Married    Spouse name: Not on file   Number of children: Not on file   Years of education: Not on file   Highest education level: High school graduate  Occupational History   Not on file  Tobacco Use   Smoking status: Never   Smokeless tobacco: Never  Vaping Use   Vaping status: Never Used  Substance and Sexual Activity   Alcohol use: Never   Drug use: Never   Sexual activity: Not on file  Other Topics Concern   Not on file  Social History Narrative   Not on file   Social Drivers of Health   Financial Resource Strain: Low Risk  (12/18/2018)   Overall Financial Resource Strain (CARDIA)    Difficulty of Paying Living Expenses: Not hard at all  Food Insecurity: No Food Insecurity (12/18/2018)   Hunger Vital Sign    Worried About Running Out of Food in the Last Year: Never true    Ran Out of Food in the Last  Year: Never true  Transportation Needs: No Transportation Needs (12/18/2018)   PRAPARE - Administrator, Civil Service (Medical): No    Lack of Transportation (Non-Medical): No  Physical Activity: Inactive (12/18/2018)   Exercise Vital Sign    Days of Exercise per Week: 0 days    Minutes of Exercise per Session: 0 min  Stress: Stress Concern Present (12/18/2018)   [employer] of Occupational Health - Occupational Stress Questionnaire    Feeling of Stress : Rather much  Social Connections: Not on file    No current facility-administered  medications on file prior to encounter.   Current Outpatient Medications on File Prior to Encounter  Medication Sig Dispense Refill   acetaminophen (TYLENOL) 500 MG tablet Take 1,000 mg by mouth every 6 (six) hours as needed (for pain).     amoxicillin (AMOXIL) 500 MG tablet Take 4 tablets (2,000 mg total) by mouth 1 hour prior to dental work/cleaning. 4 tablet 3   aspirin EC 81 MG EC tablet Take 1 tablet (81 mg total) by mouth daily.     Calcium Citrate-Vitamin D 200-250 MG-UNIT TABS Take 1 tablet by mouth daily.     estradiol (ESTRACE) 2 MG tablet Take 2 mg by mouth daily.     ibuprofen (ADVIL) 600 MG tablet Take by mouth.     polyethylene glycol (MIRALAX / GLYCOLAX) packet Take 17 g by mouth every other day.      progesterone (PROMETRIUM) 100 MG capsule Take 100 mg by mouth daily.      Review of Systems: a complete, 10pt review of systems was completed with pertinent positives and negatives as documented in the HPI  Physical Exam: Vitals:   03/09/24 1645 03/09/24 1735  BP: 135/85 123/80  Pulse: 72 76  Resp: 16 15  Temp: 98.7 F (37.1 C)   SpO2: 100% 98%   Gen: A&Ox3, no distress  Eyes: lids and conjunctivae normal, no icterus.  Chest: respiratory effort is normal.  Cardiovascular: RRR with palpable distal pulses Gastrointestinal: soft, nondistended, mildly tender in the epigastrium and right upper quadrant without guarding or peritoneal signs. No mass, hepatomegaly or splenomegaly.  Muscoloskeletal: no clubbing or cyanosis of the fingers.  Strength is symmetrical throughout.  Range of motion of bilateral upper and lower extremities normal without pain, crepitation or contracture. Neuro: No gross deficit. Psych: appropriate mood and affect, normal insight/judgment intact  Skin: warm and dry      Latest Ref Rng & Units 03/09/2024    1:15 PM 09/22/2018    5:08 AM 09/21/2018    2:59 AM  CBC  WBC 4.0 - 10.5 K/uL 13.5  10.4  15.3   Hemoglobin 12.0 - 15.0 g/dL 40.9  8.8  9.2    Hematocrit 36.0 - 46.0 % 38.2  27.2  28.5   Platelets 150 - 400 K/uL 229  109  93        Latest Ref Rng & Units 03/09/2024    2:06 PM 03/09/2024    1:15 PM 09/22/2018    5:08 AM  CMP  Glucose 70 - 99 mg/dL  811  914   BUN 6 - 20 mg/dL  20  6   Creatinine 7.82 - 1.00 mg/dL  9.56  2.13   Sodium 086 - 145 mmol/L  135  137   Potassium 3.5 - 5.1 mmol/L  3.3  3.6   Chloride 98 - 111 mmol/L  103  105   CO2 22 - 32 mmol/L  19  25   Calcium 8.9 - 10.3 mg/dL  9.9  8.3   Total Protein 6.5 - 8.1 g/dL 7.2     Total Bilirubin 0.0 - 1.2 mg/dL 1.3     Alkaline Phos 38 - 126 U/L 55     AST 15 - 41 U/L 50     ALT 0 - 44 U/L 27       Lab Results  Component Value Date   INR 3.4 (A) 01/04/2019   INR 2.3 12/24/2018   INR 1.6 (A) 12/18/2018    Imaging: US Abdomen Limited RUQ (LIVER/GB) Result Date: 03/09/2024 CLINICAL DATA:  [id number] Abdominal pain [id number] EXAM: ULTRASOUND ABDOMEN LIMITED RIGHT UPPER QUADRANT COMPARISON:  CT scan renal stone protocol from earlier the same day FINDINGS: Gallbladder: The gallbladder is distended measuring up to 9.7 cm in length and 4.2 cm in with. No abnormal wall thickening. There is a single small gallstone within. No pericholecystic free fluid. Common bile duct: Diameter: Extrahepatic bile duct measuring up to 7-8 mm in the proximal portion. In the midportion it is measured up to 4 mm. Distal CBD is obscured by overlying bowel gas. No intrahepatic bile duct dilation. Liver: No focal lesion identified. Within normal limits in parenchymal echogenicity. Portal vein is patent on color Doppler imaging with normal direction of blood flow towards the liver. Other: None. IMPRESSION: 1. Distended gallbladder containing a single small gallstone. No abnormal wall thickening or pericholecystic free fluid. No sonographic evidence of acute cholecystitis. 2. Extrahepatic bile duct measuring up to 7-8 mm in the proximal portion. No intrahepatic bile duct dilation. Distal CBD is obscured by  overlying bowel gas. Correlate clinically and with liver function tests to determine the need for additional imaging with MRI/MRCP. Electronically Signed   By: Jules Schick M.D.   On: 03/09/2024 15:48   CT Renal Stone Study Result Date: 03/09/2024 CLINICAL DATA:  Abdominal pain and flank pain. Renal calculus suspected. EXAM: CT ABDOMEN AND PELVIS WITHOUT CONTRAST TECHNIQUE: Multidetector CT imaging of the abdomen and pelvis was performed following the standard protocol without IV contrast. RADIATION DOSE REDUCTION: This exam was performed according to the departmental dose-optimization program which includes automated exposure control, adjustment of the mA and/or kV according to patient size and/or use of iterative reconstruction technique. COMPARISON:  CT 09/25/2014 FINDINGS: Lower chest: Calcification over the RIGHT hemidiaphragm appears benign. No suspicious pulmonary nodules. Hepatobiliary: No focal hepatic lesion. Round solitary 9 mm gallstone. No gallbladder inflammation. No biliary duct dilatation. Common bile duct is normal. Pancreas: Pancreas is normal. No ductal dilatation. No pancreatic inflammation. Spleen: Normal spleen Adrenals/urinary tract: Adrenal glands are normal. No nephrolithiasis or ureterolithiasis. No obstructive uropathy. Stomach/Bowel: Stomach, small-bowel cecum normal. Appendix normal. The colon and rectosigmoid colon are normal. Vascular/Lymphatic: Abdominal aorta is normal caliber. No periportal or retroperitoneal adenopathy. No pelvic adenopathy. Reproductive: Uterus and adnexa unremarkable. Other: No free fluid. Musculoskeletal: No aggressive osseous lesion. IMPRESSION: 1. No nephrolithiasis, ureterolithiasis, or obstructive uropathy. 2. Normal appendix. 3. Gallstone without evidence cholecystitis. Electronically Signed   By: Genevive Bi M.D.   On: 03/09/2024 14:42     A/P:  Likely acute calculous cholecystitis, borderline proximal CBD caliber -Empiric abx -Pain/nausea  control -IV fluid resuscitaion -Her white count has improved this morning from 13.5-11.2, her bilirubin and alk phos remain normal, she does have a mild transaminitis with a AST of 144 and ALT of 134.  At this point would recommend proceeding with laparoscopic cholecystectomy with intraoperative cholangiogram.  I reviewed the surgery  with the patient including relevant anatomy, surgical technique, and risks including bleeding, infection, pain, scarring, injury to intra-abdominal structures specifically the common bile duct and sequelae, need for conversion to subtotal cholecystectomy, bile leak, conversion to open surgery, failure to resolve symptoms, as well as general cardiovascular/pulmonary/thromboembolic complications.  Questions were welcomed and answered to the patient's satisfaction.  Will plan surgery this morning with Dr. Donell Beers.  Please keep n.p.o. for now.    Patient Active Problem List   Diagnosis Date Noted   H/O mitral valve repair 02/13/2019   Atrial fibrillation (HCC) 09/28/2018   S/P minimally invasive mitral valve repair 09/19/2018   MVP (mitral valve prolapse)    Palpitations    Mitral regurgitation    Fatigue    Chest pain    LEG EDEMA 04/03/2009   SYNCOPE 02/16/2009   ANEMIA-IRON DEFICIENCY 09/18/2008   Mitral valve prolapse 09/18/2008   ASTHMATIC BRONCHITIS, ACUTE 09/12/2007   PHARYNGITIS, ACUTE 09/07/2007   SKIN CANCER, HX OF 09/07/2007       Phylliss Blakes, MD [facility]  See AMION to contact appropriate on-call provider

## 2024-03-10 ENCOUNTER — Inpatient Hospital Stay (HOSPITAL_COMMUNITY): Admitting: Anesthesiology

## 2024-03-10 ENCOUNTER — Encounter (HOSPITAL_COMMUNITY)
Admission: EM | Disposition: A | Discharge status: Home / Self Care | Payer: Self-pay | Source: Home / Self Care | Attending: Emergency Medicine

## 2024-03-10 ENCOUNTER — Inpatient Hospital Stay (HOSPITAL_COMMUNITY)

## 2024-03-10 ENCOUNTER — Other Ambulatory Visit: Payer: Self-pay

## 2024-03-10 DIAGNOSIS — R1110 Vomiting, unspecified: Secondary | ICD-10-CM | POA: Diagnosis not present

## 2024-03-10 DIAGNOSIS — E872 Acidosis, unspecified: Secondary | ICD-10-CM | POA: Diagnosis not present

## 2024-03-10 DIAGNOSIS — K8000 Calculus of gallbladder with acute cholecystitis without obstruction: Secondary | ICD-10-CM

## 2024-03-10 DIAGNOSIS — E876 Hypokalemia: Secondary | ICD-10-CM | POA: Diagnosis not present

## 2024-03-10 DIAGNOSIS — K8012 Calculus of gallbladder with acute and chronic cholecystitis without obstruction: Secondary | ICD-10-CM | POA: Diagnosis not present

## 2024-03-10 DIAGNOSIS — Z1152 Encounter for screening for COVID-19: Secondary | ICD-10-CM | POA: Diagnosis not present

## 2024-03-10 DIAGNOSIS — R109 Unspecified abdominal pain: Secondary | ICD-10-CM | POA: Diagnosis not present

## 2024-03-10 HISTORY — PX: CHOLECYSTECTOMY: SHX55

## 2024-03-10 LAB — COMPREHENSIVE METABOLIC PANEL
ALT: 134 U/L — ABNORMAL HIGH (ref 0–44)
AST: 144 U/L — ABNORMAL HIGH (ref 15–41)
Albumin: 3.6 g/dL (ref 3.5–5.0)
Alkaline Phosphatase: 64 U/L (ref 38–126)
Anion gap: 8 (ref 5–15)
BUN: 10 mg/dL (ref 6–20)
CO2: 22 mmol/L (ref 22–32)
Calcium: 8.5 mg/dL — ABNORMAL LOW (ref 8.9–10.3)
Chloride: 106 mmol/L (ref 98–111)
Creatinine, Ser: 0.67 mg/dL (ref 0.44–1.00)
GFR, Estimated: >60 mL/min (ref >60)
Glucose, Bld: 107 mg/dL — ABNORMAL HIGH (ref 70–99)
Potassium: 3.6 mmol/L (ref 3.5–5.1)
Sodium: 136 mmol/L (ref 135–145)
Total Bilirubin: 1.1 mg/dL (ref 0.0–1.2)
Total Protein: 6.4 g/dL — ABNORMAL LOW (ref 6.5–8.1)

## 2024-03-10 LAB — CBC
HCT: 37.4 % (ref 36.0–46.0)
Hemoglobin: 12.6 g/dL (ref 12.0–15.0)
MCH: 31.5 pg (ref 26.0–34.0)
MCHC: 33.7 g/dL (ref 30.0–36.0)
MCV: 93.5 fL (ref 80.0–100.0)
Platelets: 216 10*3/uL (ref 150–400)
RBC: 4 MIL/uL (ref 3.87–5.11)
RDW: 13.8 % (ref 11.5–15.5)
WBC: 11.2 10*3/uL — ABNORMAL HIGH (ref 4.0–10.5)
nRBC: 0 % (ref 0.0–0.2)

## 2024-03-10 LAB — HIV ANTIBODY (ROUTINE TESTING W REFLEX): HIV Screen 4th Generation wRfx: NONREACTIVE

## 2024-03-10 LAB — PROTIME-INR
INR: 1 (ref 0.8–1.2)
Prothrombin Time: 13 s (ref 11.4–15.2)

## 2024-03-10 LAB — APTT: aPTT: 24 s (ref 24–36)

## 2024-03-10 SURGERY — LAPAROSCOPIC CHOLECYSTECTOMY WITH INTRAOPERATIVE CHOLANGIOGRAM
Anesthesia: General | Site: Abdomen

## 2024-03-10 MED ORDER — AMISULPRIDE (ANTIEMETIC) 5 MG/2ML IV SOLN
INTRAVENOUS | Status: AC
  Filled 2024-03-10: qty 4

## 2024-03-10 MED ORDER — PROPOFOL 500 MG/50ML IV EMUL
INTRAVENOUS | Status: DC | PRN
  Administered 2024-03-10: 150 ug/kg/min via INTRAVENOUS

## 2024-03-10 MED ORDER — LIDOCAINE HCL (PF) 1 % IJ SOLN
INTRAMUSCULAR | Status: DC | PRN
  Administered 2024-03-10: 15 mL

## 2024-03-10 MED ORDER — FENTANYL CITRATE (PF) 100 MCG/2ML IJ SOLN
INTRAMUSCULAR | Status: DC | PRN
  Administered 2024-03-10: 25 ug via INTRAVENOUS
  Administered 2024-03-10: 100 ug via INTRAVENOUS
  Administered 2024-03-10: 50 ug via INTRAVENOUS
  Administered 2024-03-10: 25 ug via INTRAVENOUS

## 2024-03-10 MED ORDER — SUGAMMADEX SODIUM 200 MG/2ML IV SOLN
INTRAVENOUS | Status: AC
  Filled 2024-03-10: qty 2

## 2024-03-10 MED ORDER — FENTANYL CITRATE (PF) 100 MCG/2ML IJ SOLN
INTRAMUSCULAR | Status: AC
Start: 2024-03-10 — End: ?
  Filled 2024-03-10: qty 2

## 2024-03-10 MED ORDER — LIDOCAINE HCL (PF) 1 % IJ SOLN
INTRAMUSCULAR | Status: AC
  Filled 2024-03-10: qty 30

## 2024-03-10 MED ORDER — SCOPOLAMINE 1 MG/3DAYS TD PT72
MEDICATED_PATCH | TRANSDERMAL | Status: AC
Start: 2024-03-10 — End: 2024-03-10
  Filled 2024-03-10: qty 1

## 2024-03-10 MED ORDER — OXYCODONE HCL 5 MG PO TABS: 2.5000 mg | ORAL_TABLET | ORAL | 0 refills | Status: DC | PRN

## 2024-03-10 MED ORDER — ACETAMINOPHEN 500 MG PO TABS
ORAL_TABLET | ORAL | Status: AC
  Filled 2024-03-10: qty 2

## 2024-03-10 MED ORDER — SCOPOLAMINE 1 MG/3DAYS TD PT72
1.0000 | MEDICATED_PATCH | TRANSDERMAL | Status: DC
  Administered 2024-03-10: 1.5 mg via TRANSDERMAL

## 2024-03-10 MED ORDER — LACTATED RINGERS IR SOLN
Status: DC | PRN
  Administered 2024-03-10: 1000 mL

## 2024-03-10 MED ORDER — OXYCODONE HCL 5 MG PO TABS
5.0000 mg | ORAL_TABLET | ORAL | Status: DC | PRN
  Administered 2024-03-10 – 2024-03-11 (×4): 5 mg via ORAL
  Filled 2024-03-10 (×4): qty 1

## 2024-03-10 MED ORDER — MIDAZOLAM HCL 2 MG/2ML IJ SOLN
INTRAMUSCULAR | Status: AC
  Filled 2024-03-10: qty 2

## 2024-03-10 MED ORDER — AMISULPRIDE (ANTIEMETIC) 5 MG/2ML IV SOLN
10.0000 mg | Freq: Once | INTRAVENOUS | Status: AC | PRN
  Administered 2024-03-10: 10 mg via INTRAVENOUS

## 2024-03-10 MED ORDER — ONDANSETRON HCL 4 MG PO TABS: 4.0000 mg | ORAL_TABLET | Freq: Four times a day (QID) | ORAL | 0 refills | Status: AC | PRN

## 2024-03-10 MED ORDER — CEFAZOLIN SODIUM-DEXTROSE 1-4 GM/50ML-% IV SOLN: 1.0000 g | INTRAVENOUS | Status: DC

## 2024-03-10 MED ORDER — FENTANYL CITRATE (PF) 100 MCG/2ML IJ SOLN
INTRAMUSCULAR | Status: AC
  Filled 2024-03-10: qty 2

## 2024-03-10 MED ORDER — BUPIVACAINE-EPINEPHRINE (PF) 0.25% -1:200000 IJ SOLN
INTRAMUSCULAR | Status: AC
  Filled 2024-03-10: qty 30

## 2024-03-10 MED ORDER — FENTANYL CITRATE PF 50 MCG/ML IJ SOSY
PREFILLED_SYRINGE | INTRAMUSCULAR | Status: AC
  Filled 2024-03-10: qty 2

## 2024-03-10 MED ORDER — ACETAMINOPHEN 500 MG PO TABS
1000.0000 mg | ORAL_TABLET | Freq: Once | ORAL | Status: AC
  Administered 2024-03-10: 1000 mg via ORAL

## 2024-03-10 MED ORDER — LACTATED RINGERS IV SOLN: INTRAVENOUS | Status: DC | PRN

## 2024-03-10 MED ORDER — SUGAMMADEX SODIUM 200 MG/2ML IV SOLN
INTRAVENOUS | Status: DC | PRN
Start: 2024-03-10 — End: 2024-03-10
  Administered 2024-03-10: 200 mg via INTRAVENOUS

## 2024-03-10 MED ORDER — OXYCODONE HCL 5 MG PO TABS: 5.0000 mg | ORAL_TABLET | Freq: Once | ORAL | Status: DC | PRN

## 2024-03-10 MED ORDER — ROCURONIUM BROMIDE 10 MG/ML (PF) SYRINGE
PREFILLED_SYRINGE | INTRAVENOUS | Status: AC
Start: 2024-03-10 — End: ?
  Filled 2024-03-10: qty 10

## 2024-03-10 MED ORDER — SODIUM CHLORIDE 0.9 % IV SOLN
INTRAVENOUS | Status: DC | PRN
  Administered 2024-03-10: 6 mL

## 2024-03-10 MED ORDER — OXYCODONE HCL 5 MG PO TABS: 2.5000 mg | ORAL_TABLET | ORAL | 0 refills | Status: AC | PRN

## 2024-03-10 MED ORDER — CEFAZOLIN SODIUM-DEXTROSE 2-4 GM/100ML-% IV SOLN
INTRAVENOUS | Status: AC
  Filled 2024-03-10: qty 100

## 2024-03-10 MED ORDER — DEXAMETHASONE SODIUM PHOSPHATE 10 MG/ML IJ SOLN
INTRAMUSCULAR | Status: AC
  Filled 2024-03-10: qty 1

## 2024-03-10 MED ORDER — FENTANYL CITRATE PF 50 MCG/ML IJ SOSY
25.0000 ug | PREFILLED_SYRINGE | INTRAMUSCULAR | Status: DC | PRN
Start: 2024-03-10 — End: 2024-03-10

## 2024-03-10 MED ORDER — ORAL CARE MOUTH RINSE: 15.0000 mL | OROMUCOSAL | Status: DC | PRN

## 2024-03-10 MED ORDER — ONDANSETRON HCL 4 MG/2ML IJ SOLN
INTRAMUSCULAR | Status: AC
  Filled 2024-03-10: qty 2

## 2024-03-10 MED ORDER — ONDANSETRON HCL 4 MG/2ML IJ SOLN
INTRAMUSCULAR | Status: DC | PRN
  Administered 2024-03-10: 4 mg via INTRAVENOUS

## 2024-03-10 MED ORDER — PROPOFOL 10 MG/ML IV BOLUS
INTRAVENOUS | Status: AC
Start: 2024-03-10 — End: ?
  Filled 2024-03-10: qty 20

## 2024-03-10 MED ORDER — ROCURONIUM BROMIDE 10 MG/ML (PF) SYRINGE
PREFILLED_SYRINGE | INTRAVENOUS | Status: DC | PRN
  Administered 2024-03-10: 50 mg via INTRAVENOUS

## 2024-03-10 MED ORDER — CELECOXIB 200 MG PO CAPS
ORAL_CAPSULE | ORAL | Status: AC
Start: 2024-03-10 — End: 2024-03-10
  Filled 2024-03-10: qty 1

## 2024-03-10 MED ORDER — DEXAMETHASONE SODIUM PHOSPHATE 10 MG/ML IJ SOLN
INTRAMUSCULAR | Status: DC | PRN
  Administered 2024-03-10: 10 mg via INTRAVENOUS

## 2024-03-10 MED ORDER — PROPOFOL 500 MG/50ML IV EMUL
INTRAVENOUS | Status: AC
  Filled 2024-03-10: qty 50

## 2024-03-10 MED ORDER — OXYCODONE HCL 5 MG/5ML PO SOLN: 5.0000 mg | Freq: Once | ORAL | Status: DC | PRN

## 2024-03-10 MED ORDER — PROPOFOL 10 MG/ML IV BOLUS
INTRAVENOUS | Status: DC | PRN
  Administered 2024-03-10: 140 mg via INTRAVENOUS

## 2024-03-10 MED ORDER — MIDAZOLAM HCL 2 MG/2ML IJ SOLN
INTRAMUSCULAR | Status: DC | PRN
  Administered 2024-03-10: 2 mg via INTRAVENOUS

## 2024-03-10 MED ORDER — CEFAZOLIN SODIUM-DEXTROSE 2-4 GM/100ML-% IV SOLN
2.0000 g | INTRAVENOUS | Status: AC
  Administered 2024-03-10: 2 g via INTRAVENOUS

## 2024-03-10 MED ORDER — LIDOCAINE HCL (PF) 2 % IJ SOLN
INTRAMUSCULAR | Status: DC | PRN
Start: 2024-03-10 — End: 2024-03-10
  Administered 2024-03-10: 60 mg via INTRADERMAL

## 2024-03-10 MED ORDER — CELECOXIB 200 MG PO CAPS
200.0000 mg | ORAL_CAPSULE | Freq: Once | ORAL | Status: AC
  Administered 2024-03-10: 200 mg via ORAL

## 2024-03-10 SURGICAL SUPPLY — 38 items
APPLIER CLIP ROT 10 11.4 M/L (STAPLE) ×1 IMPLANT
BAG COUNTER SPONGE SURGICOUNT (BAG) IMPLANT
CABLE HIGH FREQUENCY MONO STRZ (ELECTRODE) IMPLANT
CHLORAPREP W/TINT 26 (MISCELLANEOUS) ×1 IMPLANT
CLIP APPLIE ROT 10 11.4 M/L (STAPLE) ×1 IMPLANT
CLIP LIGATING HEMO LOK XL GOLD (MISCELLANEOUS) IMPLANT
CLIP LIGATING HEMO O LOK GREEN (MISCELLANEOUS) IMPLANT
COVER MAYO STAND XLG (MISCELLANEOUS) ×1 IMPLANT
COVER SURGICAL LIGHT HANDLE (MISCELLANEOUS) ×1 IMPLANT
DERMABOND ADVANCED .7 DNX12 (GAUZE/BANDAGES/DRESSINGS) IMPLANT
DRAPE C-ARM 42X120 X-RAY (DRAPES) IMPLANT
ELECT REM PT RETURN 15FT ADLT (MISCELLANEOUS) ×1 IMPLANT
GLOVE BIO SURGEON STRL SZ 6 (GLOVE) ×1 IMPLANT
GLOVE INDICATOR 6.5 STRL GRN (GLOVE) ×1 IMPLANT
GOWN STRL REUS W/ TWL XL LVL3 (GOWN DISPOSABLE) ×1 IMPLANT
HEMOSTAT SNOW SURGICEL 2X4 (HEMOSTASIS) IMPLANT
IRRIG SUCT STRYKERFLOW 2 WTIP (MISCELLANEOUS) ×1 IMPLANT
IRRIGATION SUCT STRKRFLW 2 WTP (MISCELLANEOUS) ×1 IMPLANT
KIT BASIN OR (CUSTOM PROCEDURE TRAY) ×1 IMPLANT
KIT TURNOVER KIT A (KITS) IMPLANT
L-HOOK LAP DISP 36CM (ELECTROSURGICAL) ×1 IMPLANT
LHOOK LAP DISP 36CM (ELECTROSURGICAL) ×1 IMPLANT
PENCIL SMOKE EVACUATOR (MISCELLANEOUS) ×1 IMPLANT
POUCH RETRIEVAL ECOSAC 10 (ENDOMECHANICALS) ×1 IMPLANT
PROTECTOR NERVE ULNAR (MISCELLANEOUS) IMPLANT
SCISSORS LAP 5X35 DISP (ENDOMECHANICALS) ×1 IMPLANT
SET CHOLANGIOGRAPH MIX (MISCELLANEOUS) IMPLANT
SET TUBE SMOKE EVAC HIGH FLOW (TUBING) ×1 IMPLANT
SLEEVE Z-THREAD 5X100MM (TROCAR) ×1 IMPLANT
SPIKE FLUID TRANSFER (MISCELLANEOUS) ×1 IMPLANT
SUT MNCRL AB 4-0 PS2 18 (SUTURE) ×1 IMPLANT
SYS BAG RETRIEVAL 10MM (BASKET) ×1 IMPLANT
SYSTEM BAG RETRIEVAL 10MM (BASKET) ×1 IMPLANT
TOWEL OR 17X26 10 PK STRL BLUE (TOWEL DISPOSABLE) ×1 IMPLANT
TRAY LAPAROSCOPIC (CUSTOM PROCEDURE TRAY) ×1 IMPLANT
TROCAR 11X100 Z THREAD (TROCAR) ×1 IMPLANT
TROCAR BALLN 12MMX100 BLUNT (TROCAR) ×1 IMPLANT
TROCAR Z-THREAD OPTICAL 5X100M (TROCAR) ×1 IMPLANT

## 2024-03-10 NOTE — Discharge Instructions (Signed)
 [facility] ______[facility] LAPAROSCOPIC SURGERY: POST OP INSTRUCTIONS Always review your discharge instruction sheet given to you by the facility where your surgery was performed. IF YOU HAVE DISABILITY OR FAMILY LEAVE FORMS, YOU MUST BRING THEM TO THE OFFICE FOR PROCESSING.   DO NOT GIVE THEM TO YOUR DOCTOR.  A prescription for pain medication may be given to you upon discharge.  Take your pain medication as prescribed, if needed.  If narcotic pain medicine is not needed, then you may take acetaminophen (Tylenol) or ibuprofen (Advil) as needed. Take your usually prescribed medications unless otherwise directed. If you need a refill on your pain medication, please contact your pharmacy.  They will contact our office to request authorization. Prescriptions will not be filled after 5pm or on week-ends. You should follow a light diet the first few days after arrival home, such as soup and crackers, etc.  Be sure to include lots of fluids daily. Most patients will experience some swelling and bruising in the area of the incisions.  Ice packs will help.  Swelling and bruising can take several days to resolve.  It is common to experience some constipation if taking pain medication after surgery.  Increasing fluid intake and taking a stool softener (such as Colace) will usually help or prevent this problem from occurring.  A mild laxative (Milk of Magnesia or Miralax) should be taken according to package instructions if there are no bowel movements after 48 hours. Unless discharge instructions indicate otherwise, you may remove your bandages 24-48 hours after surgery, and you may shower at that time.  You may have steri-strips (small skin tapes) in place directly over the incision.  These strips should be left on the skin for 7-10 days.  If your surgeon used skin glue on the incision, you may shower in 24 hours.  The glue will flake off over the next 2-3 weeks.  Any sutures or staples will be  removed at the office during your follow-up visit. ACTIVITIES:  You may resume regular (light) daily activities beginning the next day--such as daily self-care, walking, climbing stairs--gradually increasing activities as tolerated.  You may have sexual intercourse when it is comfortable.  Refrain from any heavy lifting or straining until approved by your doctor. You may drive when you are no longer taking prescription pain medication, you can comfortably wear a seatbelt, and you can safely maneuver your car and apply brakes. RETURN TO WORK:  __________________________________________________________ Bonita Quin should see your doctor in the office for a follow-up appointment approximately 2-3 weeks after your surgery.  Make sure that you call for this appointment within a day or two after you arrive home to insure a convenient appointment time. OTHER INSTRUCTIONS: __________________________________________________________________________________________________________________________ __________________________________________________________________________________________________________________________ WHEN TO CALL YOUR DOCTOR: Fever over 101.0 Inability to urinate Continued bleeding from incision. Increased pain, redness, or drainage from the incision. Increasing abdominal pain  The clinic staff is available to answer your questions during regular business hours.  Please don't hesitate to call and ask to speak to one of the nurses for clinical concerns.  If you have a medical emergency, go to the nearest emergency room or call 911.  A surgeon from [facility] is always on call at the hospital. [address], [address], [place], [place]  [address] ? [address] [address], [place], [place]   [address] [phone] ? [phone] ? FAX [phone] Web site: [email or web address]

## 2024-03-10 NOTE — Op Note (Signed)
 Laparoscopic Cholecystectomy with IOC   Indications: This patient presents with acute calculous cholecystitis with mildly elevated LFTs and will undergo laparoscopic cholecystectomy.  Pre-operative Diagnosis: acute calculous cholecystitis  Post-operative Diagnosis: Same  Surgeon: Almond Lint   Assistants: n/a  Anesthesia: General endotracheal anesthesia and local  ASA Class: 2  Procedure Details   The patient was seen again in the Holding Room. The risks, benefits, complications, treatment options, and expected outcomes were discussed with the patient. The possibilities of  bleeding, recurrent infection, damage to nearby structures, the need for additional procedures, failure to diagnose a condition, the possible need to convert to an open procedure, and creating a complication requiring transfusion or operation were discussed with the patient. The likelihood of improving the patient's symptoms with return to their baseline status is good.    The patient and/or family concurred with the proposed plan, giving informed consent. The site of surgery properly noted. The patient was taken to OR # 1 and placed supine on the OR table.  SCDs were placed. Antibiotic prophylaxis was administered. General endotracheal anesthesia was then administered and tolerated well. After the induction, the abdomen was prepped with Chloraprep and draped in the sterile fashion. and the procedure verified as Laparoscopic Cholecystectomy with possible Intraoperative Cholangiogram. A Time Out was held and the above information confirmed.  Local anesthetic agent was injected into the skin near the umbilicus and a vertical incision was made with a #11 blade. I dissected down to the abdominal fascia with blunt dissection.  The fascia was incised vertically and the peritoneal cavity was entered bluntly.  A pursestring suture of 0-Vicryl was placed around the fascial opening.  The Hasson cannula was inserted and secured with  the stay suture.  Pneumoperitoneum was then created with CO2 and tolerated well without any adverse changes in the patient's vital signs. An 11-mm port was placed in the subxiphoid position.  Two 5-mm ports were placed in the right upper quadrant. All skin incisions were infiltrated with a local anesthetic agent before making the incision and placing the trocars.   The patient was placed into reverse Trendelenburg position and was tilted slightly to the patient's left.  The gallbladder was identified, and there were some omental adhesions noted to the right inferolateral liver.  Adhesions were lysed bluntly and with the electrocautery where indicated, taking care not to injure any adjacent organs or viscus. The gallbladder required aspiration as it was very tense.   The fundus of the gallbladder was then grasped and retracted cephalad.  The infundibulum was grasped and retracted laterally, exposing the peritoneum overlying the triangle of Calot. The cystic duct and cystic artery were identified.  These were both skeletonized.  A window between the gallbladder and the liver was obtained to ensure a critical view.     The cystic duct was ligated with a clip distally.   An incision was made in the cystic duct and the [facility] cholangiogram catheter introduced. The catheter was secured using a clip. A cholangiogram was then performed, demonstrating good filling of left, right hepatic ducts and common duct.  There was filling into the duodenum.  The cystic duct was then ligated proximally with clips and divided. The cystic artery was ligated with clips and divided as well.   The cystic duct was then clipped once on the specimen side and thrice on the patient side.  The cystic artery was clipped once on the patient side and doubly clipped on the patient side.    The gallbladder  was dissected from the liver bed in retrograde fashion with the electrocautery. The gallbladder was removed and placed in a retrieval bag.   The gallbladder and bag were then removed through the umbilical port site.  The liver bed was irrigated and inspected. Hemostasis was achieved with the electrocautery. Copious irrigation was utilized and was repeatedly aspirated until clear.    Pneumoperitoneum was released as we removed the trocars.   The pursestring suture was used to close the umbilical fascia.  One additional 0-0 vicryl was needed.    4-0 Monocryl was used to close the skin in subcuticular fashion.   The skin was cleaned and dry, and Dermabond was applied. The patient was then extubated and brought to the recovery room in stable condition. Instrument, sponge, and needle counts were correct at closure and at the conclusion of the case.   Findings: Distended gallbladder with acute inflammation.  Mildly dilated bile ducts.  No definite filling defects seen.    Estimated Blood Loss: min         Drains: none          Specimens: Gallbladder to pathology       Complications: None; patient tolerated the procedure well.         Disposition: PACU - hemodynamically stable.         Condition: stable

## 2024-03-10 NOTE — Anesthesia Postprocedure Evaluation (Signed)
 Anesthesia Post Note  Patient: Kara Owens  Procedure(s) Performed: LAPAROSCOPIC CHOLECYSTECTOMY WITH INTRAOPERATIVE CHOLANGIOGRAM (Abdomen)     Patient location during evaluation: PACU Anesthesia Type: General Level of consciousness: awake and alert Pain management: pain level controlled Vital Signs Assessment: post-procedure vital signs reviewed and stable Respiratory status: spontaneous breathing, nonlabored ventilation, respiratory function stable and patient connected to nasal cannula oxygen Cardiovascular status: blood pressure returned to baseline and stable Postop Assessment: no apparent nausea or vomiting Anesthetic complications: no   No notable events documented.  Last Vitals:  Vitals:   03/10/24 1128 03/10/24 1141  BP:  (!) 149/90  Pulse: 76 81  Resp: 14 16  Temp:  36.7 C  SpO2: 95% 100%    Last Pain:  Vitals:   03/10/24 1141  TempSrc: Oral  PainSc: 4                  [place] Basham P Jahari Billy

## 2024-03-10 NOTE — Plan of Care (Signed)
  Problem: Education: Goal: Knowledge of General Education information will improve Description: Including pain rating scale, medication(s)/side effects and non-pharmacologic comfort measures Outcome: Progressing   Problem: Health Behavior/Discharge Planning: Goal: Ability to manage health-related needs will improve Outcome: Progressing   Problem: Clinical Measurements: Goal: Ability to maintain clinical measurements within normal limits will improve Outcome: Progressing Goal: Will remain free from infection Outcome: Progressing Goal: Diagnostic test results will improve Outcome: Progressing   Problem: Pain Managment: Goal: General experience of comfort will improve and/or be controlled Outcome: Progressing

## 2024-03-10 NOTE — Transfer of Care (Addendum)
 Immediate Anesthesia Transfer of Care Note  Patient: Kara Owens  Procedure(s) Performed: LAPAROSCOPIC CHOLECYSTECTOMY WITH INTRAOPERATIVE CHOLANGIOGRAM (Abdomen)  Patient Location: PACU  Anesthesia Type:General  Level of Consciousness: drowsy and patient cooperative  Airway & Oxygen Therapy: Patient Spontanous Breathing and Patient connected to nasal cannula oxygen  Post-op Assessment: Report given to RN and Post -op Vital signs reviewed and stable  Post vital signs: Reviewed and stable  Last Vitals:  Vitals Value Taken Time  BP 96/74 03/10/24 1049  Temp 37 C 03/10/24 1049  Pulse 71 03/10/24 1052  Resp 18 03/10/24 1052  SpO2 91 % RA 03/10/24 1052  Vitals shown include unfiled device data.  Last Pain:  Vitals:   03/10/24 0553  TempSrc: Oral  PainSc:          Complications: No notable events documented.

## 2024-03-10 NOTE — Anesthesia Preprocedure Evaluation (Addendum)
 Anesthesia Evaluation  Patient identified by MRN, date of birth, ID band Patient awake    Reviewed: Allergy & Precautions, NPO status , Patient's Chart, lab work & pertinent test results  History of Anesthesia Complications (+) PONV and history of anesthetic complications  Airway Mallampati: II  TM Distance: >3 FB Neck ROM: Full    Dental no notable dental hx.    Pulmonary neg pulmonary ROS   Pulmonary exam normal        Cardiovascular negative cardio ROS  Rhythm:Regular Rate:Normal     Neuro/Psych negative neurological ROS  negative psych ROS   GI/Hepatic Neg liver ROS,,,Cholecystitis    Endo/Other  negative endocrine ROS    Renal/GU negative Renal ROS  negative genitourinary   Musculoskeletal negative musculoskeletal ROS (+)    Abdominal Normal abdominal exam  (+)   Peds  Hematology  (+) Blood dyscrasia, anemia   Anesthesia Other Findings   Reproductive/Obstetrics                             Anesthesia Physical Anesthesia Plan  ASA: 2  Anesthesia Plan: General   Post-op Pain Management:    Induction: Intravenous  PONV Risk Score and Plan: 4 or greater and Ondansetron, Dexamethasone, Midazolam, Treatment may vary due to age or medical condition, Amisulpride, TIVA and Scopolamine patch - Pre-op  Airway Management Planned: Mask and Oral ETT  Additional Equipment: None  Intra-op Plan:   Post-operative Plan: Extubation in OR  Informed Consent: I have reviewed the patients History and Physical, chart, labs and discussed the procedure including the risks, benefits and alternatives for the proposed anesthesia with the patient or authorized representative who has indicated his/her understanding and acceptance.     Dental advisory given  Plan Discussed with: CRNA  Anesthesia Plan Comments:        Anesthesia Quick Evaluation

## 2024-03-10 NOTE — Progress Notes (Signed)
 PROGRESS NOTE  Kara Owens TDD:[id number] DOB: July 19, 1971 DOA: 03/09/2024 PCP: Laurann Montana, MD   LOS: 1 day   Brief Narrative / Interim history: 53 y.o. female with medical history significant of mitral valve prolapse status post repair not on any anticoagulation except aspirin, paroxysmal atrial fibrillation without any recent episode presented to emergency department complaining of right-sided flank pain with associated nausea and vomiting.  She was found to have acute cholecystitis, surgery was consulted and she was admitted to the hospital  Subjective / 24h Interval events: Seen postoperatively, complains of abdominal discomfort and nausea  Assesement and Plan: Principal Problem:   Abdominal pain with vomiting Active Problems:   Cholelithiasis   Acute cystitis   History of mitral valve repair   Hypokalemia   Metabolic acidosis   Hormone replacement therapy (HRT)  Principal problem Acute cholecystitis-with elevated LFTs, right upper quadrant ultrasound did show distended gallbladder with a gallstone.  General surgery consulted, she was taken to the OR on 3/16 and is status post laparoscopic cholecystectomy -Advance diet, control pain postoperatively, likely home tomorrow  Active problems Possible UTI -with increased urinary urgency and frequency, has been placed on ceftriaxone, watch cultures  Hypokalemia-replenished continue to monitor  History of MVP s/p repair-outpatient follow-up with cardiology  History of PAF-no longer on Eliquis and Tikosyn  Scheduled Meds:  acetaminophen       amisulpride       aspirin EC  81 mg Oral Daily   celecoxib       polyethylene glycol  17 g Oral Daily   scopolamine       sodium bicarbonate  650 mg Oral BID   sodium chloride flush  3 mL Intravenous Q12H   sodium chloride flush  3 mL Intravenous Q12H   Continuous Infusions:  sodium chloride 75 mL/hr at 03/10/24 1153   sodium chloride     cefTRIAXone (ROCEPHIN)  IV      metronidazole 500 mg (03/10/24 0413)   PRN Meds:.sodium chloride, acetaminophen, acetaminophen **OR** acetaminophen, amisulpride, celecoxib, morphine injection, ondansetron (ZOFRAN) IV, oxyCODONE, scopolamine, senna-docusate, sodium chloride flush  Current Outpatient Medications  Medication Instructions   acetaminophen (TYLENOL) 1,000 mg, Oral, Every 6 hours PRN   amoxicillin (AMOXIL) 500 MG tablet Take 4 tablets (2,000 mg total) by mouth 1 hour prior to dental work/cleaning.   aspirin EC 81 mg, Oral, Daily   Calcium Citrate-Vitamin D 200-250 MG-UNIT TABS 1 tablet, Oral, Daily   estradiol (ESTRACE) 2 mg, Oral, Every morning   ibuprofen (ADVIL) 600 mg, Oral, As needed   ondansetron (ZOFRAN) 4 mg, Oral, Every 6 hours PRN   oxyCODONE (OXY IR/ROXICODONE) 2.5-5 mg, Oral, Every 4 hours PRN   polyethylene glycol (MIRALAX / GLYCOLAX) 17 g, Oral, Daily PRN   progesterone (PROMETRIUM) 100 mg, Oral, Daily at bedtime    Diet Orders (From admission, onward)     Start     Ordered   03/10/24 1141  Diet Heart Room service appropriate? Yes; Fluid consistency: Thin  Diet effective now       Question Answer Comment  Room service appropriate? Yes   Fluid consistency: Thin      03/10/24 1140            DVT prophylaxis: SCDs Start: 03/09/24 2034 Place TED hose Start: 03/09/24 2034   Lab Results  Component Value Date   PLT 216 03/10/2024      Code Status: Full Code  Family Communication: Husband at bedside  Status is: Inpatient Remains inpatient appropriate  because: severity of illness  Level of care: Med-Surg  Consultants:  General surgery   Objective: Vitals:   03/10/24 1050 03/10/24 1105 03/10/24 1128 03/10/24 1141  BP:    (!) 149/90  Pulse: 71 79 76 81  Resp: 16 20 14 16   Temp:    98 F (36.7 C)  TempSrc:    Oral  SpO2: 93% 99% 95% 100%  Weight:      Height:        Intake/Output Summary (Last 24 hours) at 03/10/2024 1241 Last data filed at 03/10/2024 1222 Gross per  24 hour  Intake 2860.96 ml  Output 985 ml  Net 1875.96 ml   Wt Readings from Last 3 Encounters:  03/09/24 72.6 kg  01/10/24 70.6 kg  01/12/23 71 kg    Examination:  Constitutional: NAD Eyes: no scleral icterus ENMT: Mucous membranes are moist.  Neck: normal, supple Respiratory: clear to auscultation bilaterally, no wheezing, no crackles.  Cardiovascular: Regular rate and rhythm, no murmurs / rubs / gallops. Abdomen: non distended, no tenderness. Bowel sounds positive.  Musculoskeletal: no clubbing / cyanosis.    Data Reviewed: I have independently reviewed following labs and imaging studies   CBC Recent Labs  Lab 03/09/24 1315 03/10/24 0430  WBC 13.5* 11.2*  HGB 13.3 12.6  HCT 38.2 37.4  PLT 229 216  MCV 89.9 93.5  MCH 31.3 31.5  MCHC 34.8 33.7  RDW 13.5 13.8    Recent Labs  Lab 03/09/24 1315 03/09/24 1406 03/10/24 0430  NA 135  --  136  K 3.3*  --  3.6  CL 103  --  106  CO2 19*  --  22  GLUCOSE 138*  --  107*  BUN 20  --  10  CREATININE 0.76  --  0.67  CALCIUM 9.9  --  8.5*  AST  --  50* 144*  ALT  --  27 134*  ALKPHOS  --  55 64  BILITOT  --  1.3* 1.1  ALBUMIN  --  4.1 3.6  INR  --   --  1.0    ------------------------------------------------------------------------------------------------------------------ No results for input(s): "CHOL", "HDL", "LDLCALC", "TRIG", "CHOLHDL", "LDLDIRECT" in the last 72 hours.  Lab Results  Component Value Date   HGBA1C 5.5 09/17/2018   ------------------------------------------------------------------------------------------------------------------ No results for input(s): "TSH", "T4TOTAL", "T3FREE", "THYROIDAB" in the last 72 hours.  Invalid input(s): "FREET3"  Cardiac Enzymes No results for input(s): "CKMB", "TROPONINI", "MYOGLOBIN" in the last 168 hours.  Invalid input(s): "CK" ------------------------------------------------------------------------------------------------------------------ No results  found for: "BNP"  CBG: No results for input(s): "GLUCAP" in the last 168 hours.  Recent Results (from the past 240 hours)  Resp panel by RT-PCR (RSV, Flu A&B, Covid) Urine, Clean Catch     Status: None   Collection Time: 03/09/24  1:18 PM   Specimen: Urine, Clean Catch; Nasal Swab  Result Value Ref Range Status   SARS Coronavirus 2 by RT PCR NEGATIVE NEGATIVE Final    Comment: (NOTE) SARS-CoV-2 target nucleic acids are NOT DETECTED.  The SARS-CoV-2 RNA is generally detectable in upper respiratory specimens during the acute phase of infection. The lowest concentration of SARS-CoV-2 viral copies this assay can detect is 138 copies/mL. A negative result does not preclude SARS-Cov-2 infection and should not be used as the sole basis for treatment or other patient management decisions. A negative result may occur with  improper specimen collection/handling, submission of specimen other than nasopharyngeal swab, presence of viral mutation(s) within the areas targeted by  this assay, and inadequate number of viral copies(<138 copies/mL). A negative result must be combined with clinical observations, patient history, and epidemiological information. The expected result is Negative.  Fact Sheet for Patients:  BloggerCourse.com  Fact Sheet for Healthcare Providers:  SeriousBroker.it  This test is no t yet approved or cleared by the [place] FDA and  has been authorized for detection and/or diagnosis of SARS-CoV-2 by FDA under an Emergency Use Authorization (EUA). This EUA will remain  in effect (meaning this test can be used) for the duration of the COVID-19 declaration under Section 564(b)(1) of the Act, 21 U.S.C.section 360bbb-3(b)(1), unless the authorization is terminated  or revoked sooner.       Influenza A by PCR NEGATIVE NEGATIVE Final   Influenza B by PCR NEGATIVE NEGATIVE Final    Comment: (NOTE) The Xpert Xpress  SARS-CoV-2/FLU/RSV plus assay is intended as an aid in the diagnosis of influenza from Nasopharyngeal swab specimens and should not be used as a sole basis for treatment. Nasal washings and aspirates are unacceptable for Xpert Xpress SARS-CoV-2/FLU/RSV testing.  Fact Sheet for Patients: BloggerCourse.com  Fact Sheet for Healthcare Providers: SeriousBroker.it  This test is not yet approved or cleared by the [place] FDA and has been authorized for detection and/or diagnosis of SARS-CoV-2 by FDA under an Emergency Use Authorization (EUA). This EUA will remain in effect (meaning this test can be used) for the duration of the COVID-19 declaration under Section 564(b)(1) of the Act, 21 U.S.C. section 360bbb-3(b)(1), unless the authorization is terminated or revoked.     Resp Syncytial Virus by PCR NEGATIVE NEGATIVE Final    Comment: (NOTE) Fact Sheet for Patients: BloggerCourse.com  Fact Sheet for Healthcare Providers: SeriousBroker.it  This test is not yet approved or cleared by the [place] FDA and has been authorized for detection and/or diagnosis of SARS-CoV-2 by FDA under an Emergency Use Authorization (EUA). This EUA will remain in effect (meaning this test can be used) for the duration of the COVID-19 declaration under Section 564(b)(1) of the Act, 21 U.S.C. section 360bbb-3(b)(1), unless the authorization is terminated or revoked.  Performed at [facility], [address] [place], [place] [address]      Radiology Studies: DG Cholangiogram Operative Result Date: 03/10/2024 CLINICAL DATA:  Cholecystectomy. EXAM: INTRAOPERATIVE CHOLANGIOGRAM TECHNIQUE: Cholangiographic images from the C-arm fluoroscopic device were submitted for interpretation post-operatively. Please see the procedural report for the amount of contrast and the fluoroscopy time utilized.  FLUOROSCOPY: Radiation Exposure Index (as provided by the fluoroscopic device): 10.1 mGy Kerma COMPARISON:  Right upper quadrant ultrasound on 03/09/2024 FINDINGS: Intraoperative imaging with a C-arm demonstrates normal opacified cystic duct stump, common bile duct and visualized intrahepatic ducts. The proximal to mid CBD may be mildly dilated. No evidence of biliary stricture, filling defect or contrast extravasation. Contrast enters the duodenum normally. IMPRESSION: Possible mild dilatation of the proximal to mid CBD. No evidence of discrete biliary filling defect or obstruction. Electronically Signed   By: Irish Lack M.D.   On: 03/10/2024 11:02   US Abdomen Limited RUQ (LIVER/GB) Result Date: 03/09/2024 CLINICAL DATA:  [id number] Abdominal pain [id number] EXAM: ULTRASOUND ABDOMEN LIMITED RIGHT UPPER QUADRANT COMPARISON:  CT scan renal stone protocol from earlier the same day FINDINGS: Gallbladder: The gallbladder is distended measuring up to 9.7 cm in length and 4.2 cm in with. No abnormal wall thickening. There is a single small gallstone within. No pericholecystic free fluid. Common bile duct: Diameter: Extrahepatic bile duct  measuring up to 7-8 mm in the proximal portion. In the midportion it is measured up to 4 mm. Distal CBD is obscured by overlying bowel gas. No intrahepatic bile duct dilation. Liver: No focal lesion identified. Within normal limits in parenchymal echogenicity. Portal vein is patent on color Doppler imaging with normal direction of blood flow towards the liver. Other: None. IMPRESSION: 1. Distended gallbladder containing a single small gallstone. No abnormal wall thickening or pericholecystic free fluid. No sonographic evidence of acute cholecystitis. 2. Extrahepatic bile duct measuring up to 7-8 mm in the proximal portion. No intrahepatic bile duct dilation. Distal CBD is obscured by overlying bowel gas. Correlate clinically and with liver function tests to determine the need for  additional imaging with MRI/MRCP. Electronically Signed   By: Jules Schick M.D.   On: 03/09/2024 15:48   CT Renal Stone Study Result Date: 03/09/2024 CLINICAL DATA:  Abdominal pain and flank pain. Renal calculus suspected. EXAM: CT ABDOMEN AND PELVIS WITHOUT CONTRAST TECHNIQUE: Multidetector CT imaging of the abdomen and pelvis was performed following the standard protocol without IV contrast. RADIATION DOSE REDUCTION: This exam was performed according to the departmental dose-optimization program which includes automated exposure control, adjustment of the mA and/or kV according to patient size and/or use of iterative reconstruction technique. COMPARISON:  CT 09/25/2014 FINDINGS: Lower chest: Calcification over the RIGHT hemidiaphragm appears benign. No suspicious pulmonary nodules. Hepatobiliary: No focal hepatic lesion. Round solitary 9 mm gallstone. No gallbladder inflammation. No biliary duct dilatation. Common bile duct is normal. Pancreas: Pancreas is normal. No ductal dilatation. No pancreatic inflammation. Spleen: Normal spleen Adrenals/urinary tract: Adrenal glands are normal. No nephrolithiasis or ureterolithiasis. No obstructive uropathy. Stomach/Bowel: Stomach, small-bowel cecum normal. Appendix normal. The colon and rectosigmoid colon are normal. Vascular/Lymphatic: Abdominal aorta is normal caliber. No periportal or retroperitoneal adenopathy. No pelvic adenopathy. Reproductive: Uterus and adnexa unremarkable. Other: No free fluid. Musculoskeletal: No aggressive osseous lesion. IMPRESSION: 1. No nephrolithiasis, ureterolithiasis, or obstructive uropathy. 2. Normal appendix. 3. Gallstone without evidence cholecystitis. Electronically Signed   By: Genevive Bi M.D.   On: 03/09/2024 14:42     Pamella Pert, MD, PhD [facility]  Between 7 am - 7 pm I am available, please contact me via Amion (for emergencies) or Securechat (non urgent messages)  Between 7 pm - 7 am I am not  available, please contact night coverage MD/APP via Amion

## 2024-03-10 NOTE — Interval H&P Note (Signed)
 History and Physical Interval Note:  03/10/2024 9:11 AM  Kara Owens  has presented today for surgery, with the diagnosis of CHOLECYSTITIS.  The various methods of treatment have been discussed with the patient and family. After consideration of risks, benefits and other options for treatment, the patient has consented to  Procedure(s): LAPAROSCOPIC CHOLECYSTECTOMY WITH INTRAOPERATIVE CHOLANGIOGRAM (N/A) as a surgical intervention.  The patient's history has been reviewed, patient examined, no change in status, stable for surgery.  I have reviewed the patient's chart and labs.  Questions were answered to the patient's satisfaction.     Almond Lint

## 2024-03-10 NOTE — Anesthesia Procedure Notes (Signed)
 Procedure Name: Intubation Date/Time: 03/10/2024 9:37 AM  Performed by: Oletha Cruel, CRNAPre-anesthesia Checklist: Patient identified, Emergency Drugs available, Patient being monitored and Suction available Patient Re-evaluated:Patient Re-evaluated prior to induction Oxygen Delivery Method: Circle system utilized Preoxygenation: Pre-oxygenation with 100% oxygen Induction Type: IV induction Ventilation: Mask ventilation without difficulty Laryngoscope Size: Mac and 4 Grade View: Grade II Tube type: Oral Tube size: 7.0 mm Number of attempts: 1 Airway Equipment and Method: Stylet Placement Confirmation: ETT inserted through vocal cords under direct vision, positive ETCO2, breath sounds checked- equal and bilateral and CO2 detector Secured at: 22 cm Tube secured with: Tape Dental Injury: Teeth and Oropharynx as per pre-operative assessment  Comments: Atraumatic intubation. Lips and teeth remain in preoperative condition.

## 2024-03-11 ENCOUNTER — Encounter (HOSPITAL_COMMUNITY): Payer: Self-pay | Admitting: General Surgery

## 2024-03-11 DIAGNOSIS — R1110 Vomiting, unspecified: Secondary | ICD-10-CM | POA: Diagnosis not present

## 2024-03-11 DIAGNOSIS — R109 Unspecified abdominal pain: Secondary | ICD-10-CM | POA: Diagnosis not present

## 2024-03-11 LAB — URINE CULTURE: Culture: NO GROWTH

## 2024-03-11 LAB — COMPREHENSIVE METABOLIC PANEL
ALT: 87 U/L — ABNORMAL HIGH (ref 0–44)
AST: 60 U/L — ABNORMAL HIGH (ref 15–41)
Albumin: 3.3 g/dL — ABNORMAL LOW (ref 3.5–5.0)
Alkaline Phosphatase: 55 U/L (ref 38–126)
Anion gap: 5 (ref 5–15)
BUN: 8 mg/dL (ref 6–20)
CO2: 25 mmol/L (ref 22–32)
Calcium: 8 mg/dL — ABNORMAL LOW (ref 8.9–10.3)
Chloride: 108 mmol/L (ref 98–111)
Creatinine, Ser: 0.68 mg/dL (ref 0.44–1.00)
GFR, Estimated: >60 mL/min (ref >60)
Glucose, Bld: 119 mg/dL — ABNORMAL HIGH (ref 70–99)
Potassium: 3.6 mmol/L (ref 3.5–5.1)
Sodium: 138 mmol/L (ref 135–145)
Total Bilirubin: 0.8 mg/dL (ref 0.0–1.2)
Total Protein: 6.1 g/dL — ABNORMAL LOW (ref 6.5–8.1)

## 2024-03-11 LAB — CBC
HCT: 35.4 % — ABNORMAL LOW (ref 36.0–46.0)
Hemoglobin: 11.3 g/dL — ABNORMAL LOW (ref 12.0–15.0)
MCH: 31 pg (ref 26.0–34.0)
MCHC: 31.9 g/dL (ref 30.0–36.0)
MCV: 97 fL (ref 80.0–100.0)
Platelets: 170 10*3/uL (ref 150–400)
RBC: 3.65 MIL/uL — ABNORMAL LOW (ref 3.87–5.11)
RDW: 14.2 % (ref 11.5–15.5)
WBC: 9.1 10*3/uL (ref 4.0–10.5)
nRBC: 0 % (ref 0.0–0.2)

## 2024-03-11 MED ORDER — CARMEX CLASSIC LIP BALM EX OINT
TOPICAL_OINTMENT | CUTANEOUS | Status: DC | PRN
  Filled 2024-03-11: qty 10

## 2024-03-11 NOTE — Progress Notes (Signed)
 1 Day Post-Op  Subjective: CC: She reports pain around her umbilical incision that is worse w/ mobilization and well controlled w/ po medications. Tolerating HH diet without n/v. Voiding. Mobilizing.   Afebrile. No tachycardia or hypotension. WBC wnl. Hgb 11.3. LFT's dowtrending.   Her husband is at bedside.  She retired 2 weeks ago.   Objective: Vital signs in last 24 hours: Temp:  [97.9 F (36.6 C)-98.9 F (37.2 C)] 98.2 F (36.8 C) (03/17 0514) Pulse Rate:  [63-84] 63 (03/17 0514) Resp:  [14-20] 16 (03/17 0514) BP: (96-149)/(74-90) 127/82 (03/17 0514) SpO2:  [93 %-100 %] 100 % (03/17 0514) Last BM Date : 03/08/24  Intake/Output from previous day: 03/16 0701 - 03/17 0700 In: 3067.5 [P.O.:1800; I.V.:1167.5; IV Piggyback:100] Out: 3735 [Urine:3700; Blood:10] Intake/Output this shift: No intake/output data recorded.  PE: Gen:  Alert, NAD, pleasant Card:  Reg Pulm:  CTAB, no W/R/R, effort normal Abd: Soft, no distension, appropriately tender around laparoscopic incisions, no rigidity or guarding and otherwise NT, +BS. Incisions with glue intact appears well and are without drainage, bleeding, or signs of infection.  Ext:  No LE edema. SCD's in place Psych: A&Ox3   Lab Results:  Recent Labs    03/10/24 0430 03/11/24 0409  WBC 11.2* 9.1  HGB 12.6 11.3*  HCT 37.4 35.4*  PLT 216 170   BMET Recent Labs    03/10/24 0430 03/11/24 0409  NA 136 138  K 3.6 3.6  CL 106 108  CO2 22 25  GLUCOSE 107* 119*  BUN 10 8  CREATININE 0.67 0.68  CALCIUM 8.5* 8.0*   PT/INR Recent Labs    03/10/24 0430  LABPROT 13.0  INR 1.0   CMP     Component Value Date/Time   NA 138 03/11/2024 0409   NA 141 07/24/2018 1117   K 3.6 03/11/2024 0409   CL 108 03/11/2024 0409   CO2 25 03/11/2024 0409   GLUCOSE 119 (H) 03/11/2024 0409   BUN 8 03/11/2024 0409   BUN 12 07/24/2018 1117   CREATININE 0.68 03/11/2024 0409   CALCIUM 8.0 (L) 03/11/2024 0409   PROT 6.1 (L) 03/11/2024  0409   ALBUMIN 3.3 (L) 03/11/2024 0409   AST 60 (H) 03/11/2024 0409   ALT 87 (H) 03/11/2024 0409   ALKPHOS 55 03/11/2024 0409   BILITOT 0.8 03/11/2024 0409   GFRNONAA >60 03/11/2024 0409   GFRAA >60 09/22/2018 0508   Lipase     Component Value Date/Time   LIPASE 41 03/09/2024 1406    Studies/Results: DG Cholangiogram Operative Result Date: 03/10/2024 CLINICAL DATA:  Cholecystectomy. EXAM: INTRAOPERATIVE CHOLANGIOGRAM TECHNIQUE: Cholangiographic images from the C-arm fluoroscopic device were submitted for interpretation post-operatively. Please see the procedural report for the amount of contrast and the fluoroscopy time utilized. FLUOROSCOPY: Radiation Exposure Index (as provided by the fluoroscopic device): 10.1 mGy Kerma COMPARISON:  Right upper quadrant ultrasound on 03/09/2024 FINDINGS: Intraoperative imaging with a C-arm demonstrates normal opacified cystic duct stump, common bile duct and visualized intrahepatic ducts. The proximal to mid CBD may be mildly dilated. No evidence of biliary stricture, filling defect or contrast extravasation. Contrast enters the duodenum normally. IMPRESSION: Possible mild dilatation of the proximal to mid CBD. No evidence of discrete biliary filling defect or obstruction. Electronically Signed   By: Irish Lack M.D.   On: 03/10/2024 11:02   US Abdomen Limited RUQ (LIVER/GB) Result Date: 03/09/2024 CLINICAL DATA:  [id number] Abdominal pain [id number] EXAM: ULTRASOUND ABDOMEN LIMITED RIGHT UPPER QUADRANT COMPARISON:  CT scan renal stone protocol from earlier the same day FINDINGS: Gallbladder: The gallbladder is distended measuring up to 9.7 cm in length and 4.2 cm in with. No abnormal wall thickening. There is a single small gallstone within. No pericholecystic free fluid. Common bile duct: Diameter: Extrahepatic bile duct measuring up to 7-8 mm in the proximal portion. In the midportion it is measured up to 4 mm. Distal CBD is obscured by overlying bowel gas. No  intrahepatic bile duct dilation. Liver: No focal lesion identified. Within normal limits in parenchymal echogenicity. Portal vein is patent on color Doppler imaging with normal direction of blood flow towards the liver. Other: None. IMPRESSION: 1. Distended gallbladder containing a single small gallstone. No abnormal wall thickening or pericholecystic free fluid. No sonographic evidence of acute cholecystitis. 2. Extrahepatic bile duct measuring up to 7-8 mm in the proximal portion. No intrahepatic bile duct dilation. Distal CBD is obscured by overlying bowel gas. Correlate clinically and with liver function tests to determine the need for additional imaging with MRI/MRCP. Electronically Signed   By: Jules Schick M.D.   On: 03/09/2024 15:48   CT Renal Stone Study Result Date: 03/09/2024 CLINICAL DATA:  Abdominal pain and flank pain. Renal calculus suspected. EXAM: CT ABDOMEN AND PELVIS WITHOUT CONTRAST TECHNIQUE: Multidetector CT imaging of the abdomen and pelvis was performed following the standard protocol without IV contrast. RADIATION DOSE REDUCTION: This exam was performed according to the departmental dose-optimization program which includes automated exposure control, adjustment of the mA and/or kV according to patient size and/or use of iterative reconstruction technique. COMPARISON:  CT 09/25/2014 FINDINGS: Lower chest: Calcification over the RIGHT hemidiaphragm appears benign. No suspicious pulmonary nodules. Hepatobiliary: No focal hepatic lesion. Round solitary 9 mm gallstone. No gallbladder inflammation. No biliary duct dilatation. Common bile duct is normal. Pancreas: Pancreas is normal. No ductal dilatation. No pancreatic inflammation. Spleen: Normal spleen Adrenals/urinary tract: Adrenal glands are normal. No nephrolithiasis or ureterolithiasis. No obstructive uropathy. Stomach/Bowel: Stomach, small-bowel cecum normal. Appendix normal. The colon and rectosigmoid colon are normal.  Vascular/Lymphatic: Abdominal aorta is normal caliber. No periportal or retroperitoneal adenopathy. No pelvic adenopathy. Reproductive: Uterus and adnexa unremarkable. Other: No free fluid. Musculoskeletal: No aggressive osseous lesion. IMPRESSION: 1. No nephrolithiasis, ureterolithiasis, or obstructive uropathy. 2. Normal appendix. 3. Gallstone without evidence cholecystitis. Electronically Signed   By: Genevive Bi M.D.   On: 03/09/2024 14:42    Anti-infectives: Anti-infectives (From admission, onward)    Start     Dose/Rate Route Frequency Ordered Stop   03/10/24 1800  cefTRIAXone (ROCEPHIN) 1 g in sodium chloride 0.9 % 100 mL IVPB  Status:  Discontinued        1 g 200 mL/hr over 30 Minutes Intravenous Every 24 hours 03/09/24 2034 03/09/24 2038   03/10/24 1800  cefTRIAXone (ROCEPHIN) 2 g in sodium chloride 0.9 % 100 mL IVPB        2 g 200 mL/hr over 30 Minutes Intravenous Every 24 hours 03/09/24 2038 03/16/24 1759   03/10/24 1000  ceFAZolin (ANCEF) IVPB 2g/100 mL premix        2 g 200 mL/hr over 30 Minutes Intravenous On call to O.R. 03/10/24 0727 03/10/24 1000   03/10/24 0815  ceFAZolin (ANCEF) IVPB 1 g/50 mL premix  Status:  Discontinued        1 g 100 mL/hr over 30 Minutes Intravenous On call to O.R. 03/10/24 9147 03/10/24 0727   03/10/24 0804  ceFAZolin (ANCEF) 2-4 GM/100ML-% IVPB  Note to Pharmacy: Myrlene Broker M: cabinet override      03/10/24 0804 03/10/24 0952   03/09/24 2200  metroNIDAZOLE (FLAGYL) IVPB 500 mg        500 mg 100 mL/hr over 60 Minutes Intravenous Every 12 hours 03/09/24 2038 03/15/24 0359   03/09/24 1830  cefTRIAXone (ROCEPHIN) 2 g in sodium chloride 0.9 % 100 mL IVPB        2 g 200 mL/hr over 30 Minutes Intravenous  Once 03/09/24 1824 03/09/24 1920        Assessment/Plan POD 1 s/p laparoscopic cholecystectomy with IOC by Dr. Donell Beers for acute cholecystitis  - IOC negative. LFT's downtrending. T. Bili wnl. - Patient okay for d/c from our  standpoint. Will send pain meds to the pharmacy and arrange f/u.  Discussed discharge instructions, restrictions and return/call back precautions   FEN - HH. IVF per primary.  VTE - SCDs, ASA, okay for chem ppx from our standpoint ID -Rocephin/Flagyl. Afebrile. WBC wnl. Does not need any further abx at d/c.    LOS: 2 days    Jacinto Halim, [facility] Surgery 03/11/2024, 8:30 AM Please see Amion for pager number during day hours 7:00am-4:30pm

## 2024-03-11 NOTE — Progress Notes (Signed)
 Discharge instructions given to patient and all questions were answered.

## 2024-03-11 NOTE — Plan of Care (Signed)

## 2024-03-11 NOTE — Discharge Summary (Signed)
 Physician Discharge Summary  Kara Owens VWU:[id number] DOB: 24-Dec-1971 DOA: 03/09/2024  PCP: Laurann Montana, MD  Admit date: 03/09/2024 Discharge date: 03/11/2024  Admitted From: home Disposition:  home  Recommendations for Outpatient Follow-up:  Follow up with PCP in 1-2 weeks Follow up with general surgery in 1-2 weeks  Home Health: none Equipment/Devices: none  Discharge Condition: stable CODE STATUS: Full code  HPI: Per admitting MD, Kara Owens is a 53 y.o. female with medical history significant of mitral valve prolapse status post repair not on any anticoagulation except aspirin, paroxysmal atrial fibrillation without any recent episode presented to emergency department complaining of right-sided flank pain with associated nausea and vomiting that started around 9 AM today.  Patient denies any fever, chill, hematuria and dysuria however complaining about increased urinary frequency urgency. ED physician consulted and spoke with general surgery Dr. Doylene Canard recommended p.o. trial, if sufficient symptomatic admit to hospitalist service with surgical consult.  If patient does well she can be discharged to home.  In the ED patient failed p.o. challenge with worsening nausea and abdominal pain and she has been transferred to [facility] Course / Discharge diagnoses: Principal Problem:   Abdominal pain with vomiting Active Problems:   Cholelithiasis   Acute cystitis   History of mitral valve repair   Hypokalemia   Metabolic acidosis   Hormone replacement therapy (HRT)   Principal problem Acute cholecystitis-with elevated LFTs, right upper quadrant ultrasound did show distended gallbladder with a gallstone.  General surgery consulted, she was taken to the OR on 3/16 and is status post laparoscopic cholecystectomy.  She recovered well postoperatively, diet was advanced, pain is controlled, discussed with general surgery team and she will be discharged home in  stable condition.  Active problems Possible UTI -with increased urinary urgency and frequency, status post ceftriaxone while hospitalized  Hypokalemia-potassium improved after repletion  History of MVP s/p repair-outpatient follow-up with cardiology History of PAF-no longer on Eliquis and Tikosyn  Sepsis ruled out   Discharge Instructions   Allergies as of 03/11/2024       Reactions   Calcium Nausea Only        Medication List     TAKE these medications    acetaminophen 500 MG tablet Commonly known as: TYLENOL Take 1,000 mg by mouth every 6 (six) hours as needed (for pain).   amoxicillin 500 MG tablet Commonly known as: AMOXIL Take 4 tablets (2,000 mg total) by mouth 1 hour prior to dental work/cleaning.   aspirin EC 81 MG tablet Take 1 tablet (81 mg total) by mouth daily.   Calcium Citrate-Vitamin D 200-250 MG-UNIT Tabs Take 1 tablet by mouth daily.   estradiol 2 MG tablet Commonly known as: ESTRACE Take 2 mg by mouth in the morning.   ibuprofen 600 MG tablet Commonly known as: ADVIL Take 600 mg by mouth as needed.   ondansetron 4 MG tablet Commonly known as: Zofran Take 1 tablet (4 mg total) by mouth every 6 (six) hours as needed for nausea or vomiting.   oxyCODONE 5 MG immediate release tablet Commonly known as: Oxy IR/ROXICODONE Take 0.5-1 tablets (2.5-5 mg total) by mouth every 4 (four) hours as needed for moderate pain (pain score 4-6), severe pain (pain score 7-10) or breakthrough pain.   polyethylene glycol 17 g packet Commonly known as: MIRALAX / GLYCOLAX Take 17 g by mouth daily as needed for mild constipation or moderate constipation.   progesterone 100 MG capsule Commonly known as: PROMETRIUM  Take 100 mg by mouth at bedtime.        Follow-up Information     Maczis, Hedda Slade, PA-C Follow up in 3 week(s).   Specialty: General Surgery Contact information: [address] [place] [address] [facility] [place]  [address] [phone]                Consultations: General surgery   Procedures/Studies:  DG Cholangiogram Operative Result Date: 03/10/2024 CLINICAL DATA:  Cholecystectomy. EXAM: INTRAOPERATIVE CHOLANGIOGRAM TECHNIQUE: Cholangiographic images from the C-arm fluoroscopic device were submitted for interpretation post-operatively. Please see the procedural report for the amount of contrast and the fluoroscopy time utilized. FLUOROSCOPY: Radiation Exposure Index (as provided by the fluoroscopic device): 10.1 mGy Kerma COMPARISON:  Right upper quadrant ultrasound on 03/09/2024 FINDINGS: Intraoperative imaging with a C-arm demonstrates normal opacified cystic duct stump, common bile duct and visualized intrahepatic ducts. The proximal to mid CBD may be mildly dilated. No evidence of biliary stricture, filling defect or contrast extravasation. Contrast enters the duodenum normally. IMPRESSION: Possible mild dilatation of the proximal to mid CBD. No evidence of discrete biliary filling defect or obstruction. Electronically Signed   By: Irish Lack M.D.   On: 03/10/2024 11:02   US Abdomen Limited RUQ (LIVER/GB) Result Date: 03/09/2024 CLINICAL DATA:  [id number] Abdominal pain [id number] EXAM: ULTRASOUND ABDOMEN LIMITED RIGHT UPPER QUADRANT COMPARISON:  CT scan renal stone protocol from earlier the same day FINDINGS: Gallbladder: The gallbladder is distended measuring up to 9.7 cm in length and 4.2 cm in with. No abnormal wall thickening. There is a single small gallstone within. No pericholecystic free fluid. Common bile duct: Diameter: Extrahepatic bile duct measuring up to 7-8 mm in the proximal portion. In the midportion it is measured up to 4 mm. Distal CBD is obscured by overlying bowel gas. No intrahepatic bile duct dilation. Liver: No focal lesion identified. Within normal limits in parenchymal echogenicity. Portal vein is patent on color Doppler imaging with normal direction of blood flow towards the  liver. Other: None. IMPRESSION: 1. Distended gallbladder containing a single small gallstone. No abnormal wall thickening or pericholecystic free fluid. No sonographic evidence of acute cholecystitis. 2. Extrahepatic bile duct measuring up to 7-8 mm in the proximal portion. No intrahepatic bile duct dilation. Distal CBD is obscured by overlying bowel gas. Correlate clinically and with liver function tests to determine the need for additional imaging with MRI/MRCP. Electronically Signed   By: Jules Schick M.D.   On: 03/09/2024 15:48   CT Renal Stone Study Result Date: 03/09/2024 CLINICAL DATA:  Abdominal pain and flank pain. Renal calculus suspected. EXAM: CT ABDOMEN AND PELVIS WITHOUT CONTRAST TECHNIQUE: Multidetector CT imaging of the abdomen and pelvis was performed following the standard protocol without IV contrast. RADIATION DOSE REDUCTION: This exam was performed according to the departmental dose-optimization program which includes automated exposure control, adjustment of the mA and/or kV according to patient size and/or use of iterative reconstruction technique. COMPARISON:  CT 09/25/2014 FINDINGS: Lower chest: Calcification over the RIGHT hemidiaphragm appears benign. No suspicious pulmonary nodules. Hepatobiliary: No focal hepatic lesion. Round solitary 9 mm gallstone. No gallbladder inflammation. No biliary duct dilatation. Common bile duct is normal. Pancreas: Pancreas is normal. No ductal dilatation. No pancreatic inflammation. Spleen: Normal spleen Adrenals/urinary tract: Adrenal glands are normal. No nephrolithiasis or ureterolithiasis. No obstructive uropathy. Stomach/Bowel: Stomach, small-bowel cecum normal. Appendix normal. The colon and rectosigmoid colon are normal. Vascular/Lymphatic: Abdominal aorta is normal caliber. No periportal or retroperitoneal adenopathy. No pelvic  adenopathy. Reproductive: Uterus and adnexa unremarkable. Other: No free fluid. Musculoskeletal: No aggressive osseous  lesion. IMPRESSION: 1. No nephrolithiasis, ureterolithiasis, or obstructive uropathy. 2. Normal appendix. 3. Gallstone without evidence cholecystitis. Electronically Signed   By: Genevive Bi M.D.   On: 03/09/2024 14:42     Subjective: - no chest pain, shortness of breath, no abdominal pain, nausea or vomiting.   Discharge Exam: BP 127/82 (BP Location: Left Arm)   Pulse 63   Temp 98.2 F (36.8 C) (Oral)   Resp 16   Ht 5\' 8"  (1.727 m)   Wt 72.6 kg   SpO2 100%   BMI 24.33 kg/m   General: Pt is alert, awake, not in acute distress    The results of significant diagnostics from this hospitalization (including imaging, microbiology, ancillary and laboratory) are listed below for reference.     Microbiology: Recent Results (from the past 240 hours)  Resp panel by RT-PCR (RSV, Flu A&B, Covid) Urine, Clean Catch     Status: None   Collection Time: 03/09/24  1:18 PM   Specimen: Urine, Clean Catch; Nasal Swab  Result Value Ref Range Status   SARS Coronavirus 2 by RT PCR NEGATIVE NEGATIVE Final    Comment: (NOTE) SARS-CoV-2 target nucleic acids are NOT DETECTED.  The SARS-CoV-2 RNA is generally detectable in upper respiratory specimens during the acute phase of infection. The lowest concentration of SARS-CoV-2 viral copies this assay can detect is 138 copies/mL. A negative result does not preclude SARS-Cov-2 infection and should not be used as the sole basis for treatment or other patient management decisions. A negative result may occur with  improper specimen collection/handling, submission of specimen other than nasopharyngeal swab, presence of viral mutation(s) within the areas targeted by this assay, and inadequate number of viral copies(<138 copies/mL). A negative result must be combined with clinical observations, patient history, and epidemiological information. The expected result is Negative.  Fact Sheet for Patients:   BloggerCourse.com  Fact Sheet for Healthcare Providers:  SeriousBroker.it  This test is no t yet approved or cleared by the [place] FDA and  has been authorized for detection and/or diagnosis of SARS-CoV-2 by FDA under an Emergency Use Authorization (EUA). This EUA will remain  in effect (meaning this test can be used) for the duration of the COVID-19 declaration under Section 564(b)(1) of the Act, 21 U.S.C.section 360bbb-3(b)(1), unless the authorization is terminated  or revoked sooner.       Influenza A by PCR NEGATIVE NEGATIVE Final   Influenza B by PCR NEGATIVE NEGATIVE Final    Comment: (NOTE) The Xpert Xpress SARS-CoV-2/FLU/RSV plus assay is intended as an aid in the diagnosis of influenza from Nasopharyngeal swab specimens and should not be used as a sole basis for treatment. Nasal washings and aspirates are unacceptable for Xpert Xpress SARS-CoV-2/FLU/RSV testing.  Fact Sheet for Patients: BloggerCourse.com  Fact Sheet for Healthcare Providers: SeriousBroker.it  This test is not yet approved or cleared by the [place] FDA and has been authorized for detection and/or diagnosis of SARS-CoV-2 by FDA under an Emergency Use Authorization (EUA). This EUA will remain in effect (meaning this test can be used) for the duration of the COVID-19 declaration under Section 564(b)(1) of the Act, 21 U.S.C. section 360bbb-3(b)(1), unless the authorization is terminated or revoked.     Resp Syncytial Virus by PCR NEGATIVE NEGATIVE Final    Comment: (NOTE) Fact Sheet for Patients: BloggerCourse.com  Fact Sheet for Healthcare Providers: SeriousBroker.it  This test is not  yet approved or cleared by the [place] and has been authorized for detection and/or diagnosis of SARS-CoV-2 by FDA under an Emergency Use  Authorization (EUA). This EUA will remain in effect (meaning this test can be used) for the duration of the COVID-19 declaration under Section 564(b)(1) of the Act, 21 U.S.C. section 360bbb-3(b)(1), unless the authorization is terminated or revoked.  Performed at [facility], [address] [place], [place] [address]      Labs: Basic Metabolic Panel: Recent Labs  Lab 03/09/24 1315 03/10/24 0430 03/11/24 0409  NA 135 136 138  K 3.3* 3.6 3.6  CL 103 106 108  CO2 19* 22 25  GLUCOSE 138* 107* 119*  BUN 20 10 8   CREATININE 0.76 0.67 0.68  CALCIUM 9.9 8.5* 8.0*   Liver Function Tests: Recent Labs  Lab 03/09/24 1406 03/10/24 0430 03/11/24 0409  AST 50* 144* 60*  ALT 27 134* 87*  ALKPHOS 55 64 55  BILITOT 1.3* 1.1 0.8  PROT 7.2 6.4* 6.1*  ALBUMIN 4.1 3.6 3.3*   CBC: Recent Labs  Lab 03/09/24 1315 03/10/24 0430 03/11/24 0409  WBC 13.5* 11.2* 9.1  HGB 13.3 12.6 11.3*  HCT 38.2 37.4 35.4*  MCV 89.9 93.5 97.0  PLT 229 216 170   CBG: No results for input(s): "GLUCAP" in the last 168 hours. Hgb A1c No results for input(s): "HGBA1C" in the last 72 hours. Lipid Profile No results for input(s): "CHOL", "HDL", "LDLCALC", "TRIG", "CHOLHDL", "LDLDIRECT" in the last 72 hours. Thyroid function studies No results for input(s): "TSH", "T4TOTAL", "T3FREE", "THYROIDAB" in the last 72 hours.  Invalid input(s): "FREET3" Urinalysis    Component Value Date/Time   COLORURINE YELLOW 03/09/2024 1700   APPEARANCEUR CLEAR 03/09/2024 1700   LABSPEC 1.025 03/09/2024 1700   PHURINE 8.5 (H) 03/09/2024 1700   GLUCOSEU NEGATIVE 03/09/2024 1700   HGBUR TRACE (A) 03/09/2024 1700   HGBUR trace-lysed 09/12/2008 0903   BILIRUBINUR NEGATIVE 03/09/2024 1700   KETONESUR 40 (A) 03/09/2024 1700   PROTEINUR NEGATIVE 03/09/2024 1700   UROBILINOGEN 0.2 09/12/2008 0903   NITRITE NEGATIVE 03/09/2024 1700   LEUKOCYTESUR NEGATIVE 03/09/2024 1700    FURTHER DISCHARGE INSTRUCTIONS:    Get Medicines reviewed and adjusted: Please take all your medications with you for your next visit with your Primary MD   Laboratory/radiological data: Please request your Primary MD to go over all hospital tests and procedure/radiological results at the follow up, please ask your Primary MD to get all Hospital records sent to his/her office.   In some cases, they will be blood work, cultures and biopsy results pending at the time of your discharge. Please request that your primary care M.D. goes through all the records of your hospital data and follows up on these results.   Also Note the following: If you experience worsening of your admission symptoms, develop shortness of breath, life threatening emergency, suicidal or homicidal thoughts you must seek medical attention immediately by calling 911 or calling your MD immediately  if symptoms less severe.   You must read complete instructions/literature along with all the possible adverse reactions/side effects for all the Medicines you take and that have been prescribed to you. Take any new Medicines after you have completely understood and accpet all the possible adverse reactions/side effects.    Do not drive when taking Pain medications or sleeping medications (Benzodaizepines)   Do not take more than prescribed Pain, Sleep and Anxiety Medications. It is not advisable to  combine anxiety,sleep and pain medications without talking with your primary care practitioner   Special Instructions: If you have smoked or chewed Tobacco  in the last 2 yrs please stop smoking, stop any regular Alcohol  and or any Recreational drug use.   Wear Seat belts while driving.   Please note: You were cared for by a hospitalist during your hospital stay. Once you are discharged, your primary care physician will handle any further medical issues. Please note that NO REFILLS for any discharge medications will be authorized once you are discharged, as it is  imperative that you return to your primary care physician (or establish a relationship with a primary care physician if you do not have one) for your post hospital discharge needs so that they can reassess your need for medications and monitor your lab values.  Time coordinating discharge: 20 minutes  SIGNED:  Pamella Pert, MD, PhD 03/11/2024, 8:32 AM

## 2024-03-12 LAB — SURGICAL PATHOLOGY

## 2024-07-11 ENCOUNTER — Other Ambulatory Visit: Payer: Self-pay | Admitting: Gastroenterology

## 2024-07-11 ENCOUNTER — Ambulatory Visit
Admission: RE | Admit: 2024-07-11 | Discharge: 2024-07-11 | Disposition: A | Discharge status: Home / Self Care | Source: Ambulatory Visit | Attending: Gastroenterology

## 2024-07-11 DIAGNOSIS — R1010 Upper abdominal pain, unspecified: Secondary | ICD-10-CM

## 2024-07-11 DIAGNOSIS — R197 Diarrhea, unspecified: Secondary | ICD-10-CM

## 2024-12-04 ENCOUNTER — Encounter: Payer: Self-pay | Admitting: Cardiology

## 2025-03-04 ENCOUNTER — Ambulatory Visit: Admitting: Cardiology
